# Patient Record
Sex: Female | Born: 1960 | Race: Black or African American | Hispanic: No | Marital: Married | Smoking: Never smoker
Health system: Southern US, Community
[De-identification: ages and names within clinical notes are randomized; demographics above are authoritative.]

## PROBLEM LIST (undated history)

## (undated) DIAGNOSIS — M549 Dorsalgia, unspecified: Secondary | ICD-10-CM

## (undated) DIAGNOSIS — F329 Major depressive disorder, single episode, unspecified: Secondary | ICD-10-CM

## (undated) DIAGNOSIS — I341 Nonrheumatic mitral (valve) prolapse: Secondary | ICD-10-CM

## (undated) DIAGNOSIS — R200 Anesthesia of skin: Secondary | ICD-10-CM

## (undated) DIAGNOSIS — F32A Depression, unspecified: Secondary | ICD-10-CM

## (undated) DIAGNOSIS — G8929 Other chronic pain: Secondary | ICD-10-CM

## (undated) DIAGNOSIS — K5909 Other constipation: Secondary | ICD-10-CM

## (undated) DIAGNOSIS — M1990 Unspecified osteoarthritis, unspecified site: Secondary | ICD-10-CM

## (undated) DIAGNOSIS — I471 Supraventricular tachycardia, unspecified: Secondary | ICD-10-CM

## (undated) DIAGNOSIS — F419 Anxiety disorder, unspecified: Secondary | ICD-10-CM

## (undated) HISTORY — DX: Anxiety disorder, unspecified: F41.9

## (undated) HISTORY — DX: Depression, unspecified: F32.A

## (undated) HISTORY — DX: Anesthesia of skin: R20.0

## (undated) HISTORY — DX: Nonrheumatic mitral (valve) prolapse: I34.1

## (undated) HISTORY — PX: ABDOMINAL HYSTERECTOMY: SHX81

## (undated) HISTORY — DX: Unspecified osteoarthritis, unspecified site: M19.90

## (undated) HISTORY — PX: SPINE SURGERY: SHX786

## (undated) HISTORY — DX: Major depressive disorder, single episode, unspecified: F32.9

## (undated) HISTORY — DX: Supraventricular tachycardia: I47.1

## (undated) HISTORY — DX: Supraventricular tachycardia, unspecified: I47.10

## (undated) HISTORY — DX: Other constipation: K59.09

---

## 1991-02-06 HISTORY — PX: PARTIAL HYSTERECTOMY: SHX80

## 1996-02-06 HISTORY — PX: AV NODE ABLATION: SHX1209

## 1997-09-22 ENCOUNTER — Other Ambulatory Visit: Admission: RE | Admit: 1997-09-22 | Discharge: 1997-09-22 | Payer: Self-pay | Admitting: Obstetrics & Gynecology

## 1997-12-15 ENCOUNTER — Observation Stay (HOSPITAL_COMMUNITY): Admission: AD | Admit: 1997-12-15 | Discharge: 1997-12-16 | Payer: Self-pay | Admitting: Internal Medicine

## 1998-10-28 ENCOUNTER — Encounter: Payer: Self-pay | Admitting: Physical Medicine & Rehabilitation

## 1998-10-28 ENCOUNTER — Ambulatory Visit (HOSPITAL_COMMUNITY)
Admission: RE | Admit: 1998-10-28 | Discharge: 1998-10-28 | Payer: Self-pay | Admitting: Physical Medicine & Rehabilitation

## 1999-02-06 HISTORY — PX: BACK SURGERY: SHX140

## 1999-05-07 ENCOUNTER — Ambulatory Visit (HOSPITAL_COMMUNITY): Admission: RE | Admit: 1999-05-07 | Discharge: 1999-05-07 | Payer: Self-pay | Admitting: Orthopedic Surgery

## 1999-05-07 ENCOUNTER — Encounter: Payer: Self-pay | Admitting: Orthopedic Surgery

## 2000-07-31 HISTORY — PX: OTHER SURGICAL HISTORY: SHX169

## 2000-11-06 ENCOUNTER — Encounter: Payer: Self-pay | Admitting: *Deleted

## 2000-11-06 ENCOUNTER — Encounter: Admission: RE | Admit: 2000-11-06 | Discharge: 2000-11-06 | Payer: Self-pay | Admitting: *Deleted

## 2001-02-14 ENCOUNTER — Encounter (HOSPITAL_COMMUNITY): Admission: RE | Admit: 2001-02-14 | Discharge: 2001-03-16 | Payer: Self-pay | Admitting: *Deleted

## 2001-03-18 ENCOUNTER — Encounter: Admission: RE | Admit: 2001-03-18 | Discharge: 2001-04-17 | Payer: Self-pay | Admitting: *Deleted

## 2002-02-10 ENCOUNTER — Emergency Department (HOSPITAL_COMMUNITY): Admission: EM | Admit: 2002-02-10 | Discharge: 2002-02-10 | Payer: Self-pay | Admitting: *Deleted

## 2002-10-23 ENCOUNTER — Encounter: Payer: Self-pay | Admitting: Obstetrics and Gynecology

## 2002-10-23 ENCOUNTER — Ambulatory Visit (HOSPITAL_COMMUNITY): Admission: RE | Admit: 2002-10-23 | Discharge: 2002-10-23 | Payer: Self-pay | Admitting: Obstetrics and Gynecology

## 2002-11-17 ENCOUNTER — Encounter: Payer: Self-pay | Admitting: Obstetrics and Gynecology

## 2002-11-17 ENCOUNTER — Encounter: Admission: RE | Admit: 2002-11-17 | Discharge: 2002-11-17 | Payer: Self-pay | Admitting: Obstetrics and Gynecology

## 2002-11-23 ENCOUNTER — Encounter: Payer: Self-pay | Admitting: Unknown Physician Specialty

## 2002-11-23 ENCOUNTER — Ambulatory Visit (HOSPITAL_COMMUNITY): Admission: RE | Admit: 2002-11-23 | Discharge: 2002-11-23 | Payer: Self-pay | Admitting: Unknown Physician Specialty

## 2005-07-04 ENCOUNTER — Encounter: Admission: RE | Admit: 2005-07-04 | Discharge: 2005-07-04 | Payer: Self-pay | Admitting: Obstetrics and Gynecology

## 2006-12-13 ENCOUNTER — Encounter: Admission: RE | Admit: 2006-12-13 | Discharge: 2006-12-13 | Payer: Self-pay | Admitting: Obstetrics and Gynecology

## 2007-12-29 ENCOUNTER — Encounter: Admission: RE | Admit: 2007-12-29 | Discharge: 2007-12-29 | Payer: Self-pay | Admitting: Obstetrics and Gynecology

## 2008-02-06 HISTORY — PX: COLONOSCOPY: SHX174

## 2008-07-20 ENCOUNTER — Ambulatory Visit: Payer: Self-pay | Admitting: Gastroenterology

## 2008-08-06 ENCOUNTER — Ambulatory Visit: Payer: Self-pay | Admitting: Gastroenterology

## 2009-01-14 ENCOUNTER — Encounter: Admission: RE | Admit: 2009-01-14 | Discharge: 2009-01-14 | Payer: Self-pay | Admitting: Obstetrics and Gynecology

## 2010-01-18 ENCOUNTER — Encounter
Admission: RE | Admit: 2010-01-18 | Discharge: 2010-01-18 | Payer: Self-pay | Source: Home / Self Care | Attending: Obstetrics and Gynecology | Admitting: Obstetrics and Gynecology

## 2010-04-03 ENCOUNTER — Encounter (INDEPENDENT_AMBULATORY_CARE_PROVIDER_SITE_OTHER): Payer: Self-pay | Admitting: *Deleted

## 2010-04-13 NOTE — Letter (Signed)
Summary: New Patient letter  [facility]  [address] [place], [place] [address]   Phone: [phone]  Fax: [phone]       04/03/2010 MRN: [id number]  [facility] [place] [address] [place], [place]  [address]  Dear Ms. Lieber,  Welcome to the Gastroenterology Division at [facility].    You are scheduled to see Dr.  Russella Dar on 05-08-10 at 10:45A.M. on the 3rd floor at [facility], [address]  We ask that you try to arrive at our office 15 minutes prior to your appointment time to allow for check-in.  We would like you to complete the enclosed self-administered evaluation form prior to your visit and bring it with you on the day of your appointment.  We will review it with you.  Also, please bring a complete list of all your medications or, if you prefer, bring the medication bottles and we will list them.  Please bring your insurance card so that we may make a copy of it.  If your insurance requires a referral to see a specialist, please bring your referral form from your primary care physician.  Co-payments are due at the time of your visit and may be paid by cash, check or credit card.     Your office visit will consist of a consult with your physician (includes a physical exam), any laboratory testing he/she may order, scheduling of any necessary diagnostic testing (e.g. x-ray, ultrasound, CT-scan), and scheduling of a procedure (e.g. Endoscopy, Colonoscopy) if required.  Please allow enough time on your schedule to allow for any/all of these possibilities.    If you cannot keep your appointment, please call [phone] to cancel or reschedule prior to your appointment date.  This allows [place] the opportunity to schedule an appointment for another patient in need of care.  If you do not cancel or reschedule by 5 p.m. the business day prior to your appointment date, you will be charged a $50.00 late cancellation/no-show fee.    Thank you for choosing [facility] for your medical needs.  We appreciate the opportunity to care for you.  Please visit [place] at our website  to learn more about our practice.                     Sincerely,                                                             The Gastroenterology Division

## 2010-05-08 ENCOUNTER — Encounter: Payer: Self-pay | Admitting: Gastroenterology

## 2010-05-08 ENCOUNTER — Ambulatory Visit (INDEPENDENT_AMBULATORY_CARE_PROVIDER_SITE_OTHER): Payer: Medicare Other | Admitting: Gastroenterology

## 2010-05-08 VITALS — BP 108/60 | HR 88 | Ht 72.0 in | Wt 200.8 lb

## 2010-05-08 DIAGNOSIS — K5900 Constipation, unspecified: Secondary | ICD-10-CM | POA: Insufficient documentation

## 2010-05-08 MED ORDER — LACTULOSE 10 GM/15ML PO SOLN: 20.0000 g | Freq: Three times a day (TID) | ORAL | Status: AC

## 2010-05-08 NOTE — Assessment & Plan Note (Addendum)
Chronic constipation likely related to, or at Least exacerbated by, chronic narcotic usage. Colonoscopy 08/2008 and was entirely normal. There is no gastrointestinal reason for her weight gain. Her bloating and flatulence are likely related to constipation. She is very frustrated and was not receptive to several of my recommendations which included a fiber supplement, increased water intake and MiraLax 2-4 times daily titrated for adequate bowel movements. She is agreeable to trying lactulose. If this is not successful consider Amitza. If she could decrease or discontinue narcotic pain medications that would be helpful but is not likely to occur. Occasional scant amount of bright red blood with bowel movements most likely related to a benign anorectal source such as hemorrhoids.

## 2010-05-08 NOTE — Progress Notes (Signed)
History of Present Illness: This is a 50 year old female with chronic constipation. She previously underwent colonoscopy in July 2010 and that was normal. She has been maintained on pain medication since the late 1990s for chronic back pain. She is currently maintained on sustained release morphine. She states she's tried multiple laxatives over the years without consistent success. Has tried MiraLax on a once daily basis without adequate results. She notes that she has cramping from Ducolax and Ex-lax but they are effective. She complains of a 65 pound weight gain over the past 2 years. She occasionally notes a scant amount of rare red blood on the tissue paper and in the commode after straining with bowel movement. She notes bloating abdominal discomfort and gas that improved after a bowel movement. She denies melena, change in stool caliber, nausea, vomiting.  Past Medical History  Diagnosis Date  . Chronic constipation   . Supraventricular tachycardia   . MVP (mitral valve prolapse)   . Vitamin D deficiency   . Depression   . Arthritis   . Colon polyp    Past Surgical History  Procedure Date  . Av node ablation   . Partial hysterectomy 1993  . Back surgery   . Stimulation system implant 07/31/00    reports that she has never smoked. She has never used smokeless tobacco. She reports that she does not drink alcohol or use illicit drugs. family history includes Colon polyps in her father; Coronary artery disease in her father; Hodgkin's lymphoma in her son; Liver cancer in her paternal uncle; and Prostate cancer in her maternal grandfather.  There is no history of Colon cancer. Allergies  Allergen Reactions  . Asa Buff (Mag (Aspirin Buffered)    Outpatient Encounter Prescriptions as of 05/08/2010  Medication Sig Dispense Refill  . buPROPion (WELLBUTRIN SR) 150 MG 12 hr tablet Take 300 mg by mouth daily.        . Cholecalciferol (VITAMIN D3) 2000 UNITS TABS Take 1 tablet by mouth daily.         . DULoxetine (CYMBALTA) 60 MG capsule Take 60 mg by mouth daily.        Marland Kitchen FIBER PO Take 540 mg daily (5 tabs twice daily)       . gabapentin (NEURONTIN) 400 MG tablet Take 400 mg by mouth 3 (three) times daily.        . methylphenidate (RITALIN) 10 MG tablet Take 20 mg by mouth 2 (two) times daily.        Marland Kitchen morphine (MSIR) 15 MG tablet Take 15 mg by mouth 3 (three) times daily.        . Omega 3-6-9 Fatty Acids (OMEGA 3-6-9 COMPLEX) CAPS Take 1 capsule by mouth daily.        . Potassium 99 MG TABS Take 1 tablet by mouth daily.        Marland Kitchen tiZANidine (ZANAFLEX) 4 MG tablet Take 4 mg by mouth daily.        . vitamin B-12 (CYANOCOBALAMIN) 1000 MCG tablet Take 1,000 mcg by mouth daily.        Marland Kitchen zolpidem (AMBIEN) 10 MG tablet Take 10 mg by mouth at bedtime as needed.        . lactulose (CHRONULAC) 10 GM/15ML solution Take 30 mLs (20 g total) by mouth 3 (three) times daily. Take 30cc's 3-4 x daily  240 mL  5    Physical Exam: General: Well developed , well nourished, no acute distress Head: Normocephalic and atraumatic Eyes:  sclerae anicteric, EOMI Ears: Normal auditory acuity Mouth: No deformity or lesions Lungs: Clear throughout to auscultation Heart: Regular rate and rhythm; no murmurs, rubs or bruits Abdomen: Soft, non tender and non distended. No masses, hepatosplenomegaly or hernias noted. Normal Bowel sounds Musculoskeletal: Symmetrical with no gross deformities  Pulses:  Normal pulses noted Extremities: No clubbing, cyanosis, edema or deformities noted Neurological: Alert oriented x 4, grossly nonfocal Psychological:  Alert and cooperative. Normal mood and affect  Assessment and Recommendations:

## 2010-05-08 NOTE — Patient Instructions (Signed)
Your prescription for Lactulose has been sent to your pharmacy to take 3-4 times daily.  Start Magnesium Citrate 2-3 bottles today and tomorrow if needed to start moving your bowels.  Cc: Rudi Heap, MD

## 2010-12-13 ENCOUNTER — Other Ambulatory Visit: Payer: Self-pay | Admitting: Gynecology

## 2010-12-13 DIAGNOSIS — Z1231 Encounter for screening mammogram for malignant neoplasm of breast: Secondary | ICD-10-CM

## 2011-01-23 ENCOUNTER — Ambulatory Visit
Admission: RE | Admit: 2011-01-23 | Discharge: 2011-01-23 | Disposition: A | Discharge status: Home / Self Care | Payer: Medicare Other | Source: Ambulatory Visit | Attending: Gynecology | Admitting: Gynecology

## 2011-01-23 DIAGNOSIS — Z1231 Encounter for screening mammogram for malignant neoplasm of breast: Secondary | ICD-10-CM

## 2011-12-21 ENCOUNTER — Other Ambulatory Visit: Payer: Self-pay | Admitting: Gynecology

## 2011-12-21 DIAGNOSIS — Z1231 Encounter for screening mammogram for malignant neoplasm of breast: Secondary | ICD-10-CM

## 2012-01-11 DIAGNOSIS — M5432 Sciatica, left side: Secondary | ICD-10-CM | POA: Insufficient documentation

## 2012-01-28 ENCOUNTER — Ambulatory Visit
Admission: RE | Admit: 2012-01-28 | Discharge: 2012-01-28 | Disposition: A | Discharge status: Home / Self Care | Payer: Medicare Other | Source: Ambulatory Visit | Attending: Gynecology | Admitting: Gynecology

## 2012-01-28 DIAGNOSIS — Z1231 Encounter for screening mammogram for malignant neoplasm of breast: Secondary | ICD-10-CM

## 2012-06-17 ENCOUNTER — Telehealth: Payer: Self-pay | Admitting: Nurse Practitioner

## 2012-06-17 NOTE — Telephone Encounter (Signed)
APPT MADE

## 2012-06-18 ENCOUNTER — Ambulatory Visit (INDEPENDENT_AMBULATORY_CARE_PROVIDER_SITE_OTHER): Payer: Medicare Other

## 2012-06-18 ENCOUNTER — Encounter: Payer: Self-pay | Admitting: Physician Assistant

## 2012-06-18 ENCOUNTER — Ambulatory Visit (INDEPENDENT_AMBULATORY_CARE_PROVIDER_SITE_OTHER): Payer: Medicare Other | Admitting: Physician Assistant

## 2012-06-18 VITALS — BP 112/70 | HR 89 | Temp 97.5°F | Ht 69.0 in | Wt 195.0 lb

## 2012-06-18 DIAGNOSIS — M79645 Pain in left finger(s): Secondary | ICD-10-CM

## 2012-06-18 DIAGNOSIS — M79609 Pain in unspecified limb: Secondary | ICD-10-CM

## 2012-06-18 MED ORDER — LINACLOTIDE 145 MCG PO CAPS: 145.0000 ug | ORAL_CAPSULE | Freq: Every day | ORAL | Status: DC

## 2012-06-18 NOTE — Progress Notes (Signed)
Subjective:     Patient ID: Molly Castaneda, female   DOB: [birth year], 52 y.o.   MRN: [id number]  HPI Pt with pain and swelling to the L 3rd digit x 3 weeks Denies any definite injury but not sure Denies any pain/numbness to the hand/finger No hx of same Pt already on pain meds for chronic LBP  Review of Systems  All other systems reviewed and are negative.       Objective:   Physical Exam Edema to the PIP joint of the L 3rd digit Decrease in ROM + TTP of joint Boutonn. Deformity noted Xray- neg    Assessment:     1. Finger pain, left        Plan:     Buddy tape Refer to Ortho Pt already has pain meds F/U prn

## 2012-06-18 NOTE — Addendum Note (Signed)
Addended by: Gwenith Daily on: 06/18/2012 10:50 AM   Modules accepted: Orders

## 2012-06-18 NOTE — Patient Instructions (Signed)
Boutonniere handout given

## 2012-09-25 ENCOUNTER — Other Ambulatory Visit: Payer: Self-pay | Admitting: Physician Assistant

## 2012-10-31 ENCOUNTER — Other Ambulatory Visit: Payer: Self-pay

## 2012-10-31 NOTE — Telephone Encounter (Signed)
Last seen 06/18/12  WLW   Furosemide not on EPIC list  Sent from [facility]

## 2012-11-03 ENCOUNTER — Other Ambulatory Visit: Payer: Self-pay

## 2012-11-03 NOTE — Telephone Encounter (Signed)
Last seen 06/18/12  WLW  Furosemide not on EPIC list of meds

## 2012-11-05 MED ORDER — FUROSEMIDE 40 MG PO TABS: 40.0000 mg | ORAL_TABLET | Freq: Every day | ORAL | Status: DC

## 2012-11-07 MED ORDER — FUROSEMIDE 40 MG PO TABS: 40.0000 mg | ORAL_TABLET | Freq: Every day | ORAL | Status: DC

## 2012-11-07 NOTE — Telephone Encounter (Signed)
Patient needs to be seen. Has exceeded time since last visit. Needs to bring all medications to next appointment. Last refill. 

## 2012-11-08 NOTE — Telephone Encounter (Signed)
kmart notified of refill on furosemide left on voice mail

## 2012-12-31 ENCOUNTER — Other Ambulatory Visit: Payer: Self-pay

## 2012-12-31 DIAGNOSIS — Z1231 Encounter for screening mammogram for malignant neoplasm of breast: Secondary | ICD-10-CM

## 2013-01-10 ENCOUNTER — Other Ambulatory Visit: Payer: Self-pay | Admitting: Family Medicine

## 2013-01-12 NOTE — Telephone Encounter (Signed)
Last seen 06/18/12  WLW 

## 2013-01-21 ENCOUNTER — Ambulatory Visit (INDEPENDENT_AMBULATORY_CARE_PROVIDER_SITE_OTHER): Payer: Medicare Other | Admitting: General Practice

## 2013-01-21 ENCOUNTER — Encounter: Payer: Self-pay | Admitting: General Practice

## 2013-01-21 VITALS — BP 117/74 | HR 100 | Temp 97.9°F | Ht 69.0 in | Wt 194.5 lb

## 2013-01-21 DIAGNOSIS — Z13820 Encounter for screening for osteoporosis: Secondary | ICD-10-CM

## 2013-01-21 DIAGNOSIS — Z0000 Encounter for general adult medical examination without abnormal findings: Secondary | ICD-10-CM

## 2013-01-21 DIAGNOSIS — R609 Edema, unspecified: Secondary | ICD-10-CM

## 2013-01-21 DIAGNOSIS — K5909 Other constipation: Secondary | ICD-10-CM

## 2013-01-21 DIAGNOSIS — M20002 Unspecified deformity of left finger(s): Secondary | ICD-10-CM

## 2013-01-21 LAB — POCT CBC
Lymph, poc: 1.5 (ref 0.6–3.4)
Platelet Count, POC: 272 10*3/uL (ref 142–424)
RBC: 5.2 M/uL (ref 4.04–5.48)
RDW, POC: 13.6 %

## 2013-01-21 MED ORDER — LINACLOTIDE 290 MCG PO CAPS: 290.0000 ug | ORAL_CAPSULE | Freq: Every day | ORAL | Status: DC

## 2013-01-21 MED ORDER — FUROSEMIDE 40 MG PO TABS: 40.0000 mg | ORAL_TABLET | Freq: Every day | ORAL | Status: DC

## 2013-01-21 NOTE — Progress Notes (Signed)
   Subjective:    Patient ID: Molly Castaneda, female    DOB: 1960/08/24, 52 y.o.   MRN: [id number]  HPI Patient presents today for chronic health follow up. She has a history of fluid retention, edema and constipation. She has a history of chronic pain and is being managed by a pain management clinic in [place]. She is currently taking linzess daily and finds that it is not as effective. Verbalizes at times it is 3 weeks before having bowel movement. Reports trying to eat a healthy diet (higher in fiber) and adequate water intake.  Reports having a frozen left hand middle finger and would like referral to ortho.    Review of Systems  Constitutional: Negative for fever and chills.  Respiratory: Negative for chest tightness, shortness of breath and wheezing.   Cardiovascular: Negative for chest pain, palpitations and leg swelling.  Gastrointestinal: Negative for nausea, vomiting, abdominal pain, diarrhea, constipation and blood in stool.  Genitourinary: Negative for dysuria and difficulty urinating.  Neurological: Negative for dizziness, weakness and headaches.       Objective:   Physical Exam  Constitutional: She is oriented to person, place, and time. She appears well-developed and well-nourished.  HENT:  Head: Normocephalic and atraumatic.  Right Ear: External ear normal.  Left Ear: External ear normal.  Eyes: Pupils are equal, round, and reactive to light.  Neck: Normal range of motion. Neck supple. No thyromegaly present.  Cardiovascular: Normal rate, regular rhythm and normal heart sounds.   Pulmonary/Chest: Effort normal and breath sounds normal. No respiratory distress. She exhibits no tenderness.  Abdominal: Soft. Bowel sounds are normal. She exhibits no distension. There is no tenderness.  Lymphadenopathy:    She has no cervical adenopathy.  Neurological: She is alert and oriented to person, place, and time.  Skin: Skin is warm and dry.  Psychiatric: She has a normal  mood and affect.          Assessment & Plan:  1. Annual physical exam  - POCT CBC - CMP14+EGFR - NMR, lipoprofile  2. Chronic constipation  - Linaclotide (LINZESS) 290 MCG CAPS capsule; Take 1 capsule (290 mcg total) by mouth daily.  Dispense: 30 capsule; Refill: 6  3. Edema  - furosemide (LASIX) 40 MG tablet; Take 1 tablet (40 mg total) by mouth daily.  Dispense: 30 tablet; Refill: 6  4. Finger deformity, left  - Ambulatory referral to Orthopedic Surgery  5. Osteoporosis screening  - DG Bone Density; Future -Continue all current medications Labs pending F/u in 6 months Discussed benefits of regular exercise and healthy eating Patient verbalized understanding Coralie Keens, FNP-C

## 2013-01-21 NOTE — Patient Instructions (Signed)

## 2013-01-23 LAB — NMR, LIPOPROFILE
Cholesterol: 181 mg/dL (ref <200)
LDLC SERPL CALC-MCNC: 101 mg/dL — ABNORMAL HIGH (ref <100)
LP-IR Score: <25 (ref <=45)
Small LDL Particle Number: 445 nmol/L (ref <=527)

## 2013-01-23 LAB — CMP14+EGFR
ALT: 14 IU/L (ref 0–32)
BUN/Creatinine Ratio: 19 (ref 9–23)
GFR calc Af Amer: 89 mL/min/{1.73_m2} (ref >59)
GFR calc non Af Amer: 77 mL/min/{1.73_m2} (ref >59)
Glucose: 78 mg/dL (ref 65–99)
Potassium: 4.3 mmol/L (ref 3.5–5.2)
Sodium: 139 mmol/L (ref 134–144)

## 2013-02-11 ENCOUNTER — Other Ambulatory Visit: Payer: Self-pay | Admitting: General Practice

## 2013-02-11 ENCOUNTER — Ambulatory Visit: Payer: Medicare Other

## 2013-02-11 ENCOUNTER — Other Ambulatory Visit: Payer: Medicare HMO

## 2013-02-11 ENCOUNTER — Ambulatory Visit (INDEPENDENT_AMBULATORY_CARE_PROVIDER_SITE_OTHER): Payer: Medicare HMO

## 2013-02-11 ENCOUNTER — Ambulatory Visit (INDEPENDENT_AMBULATORY_CARE_PROVIDER_SITE_OTHER): Payer: Medicare HMO | Admitting: Pharmacist

## 2013-02-11 VITALS — Ht 68.5 in | Wt 190.0 lb

## 2013-02-11 DIAGNOSIS — M949 Disorder of cartilage, unspecified: Secondary | ICD-10-CM

## 2013-02-11 DIAGNOSIS — M8580 Other specified disorders of bone density and structure, unspecified site: Secondary | ICD-10-CM

## 2013-02-11 DIAGNOSIS — K5909 Other constipation: Secondary | ICD-10-CM

## 2013-02-11 DIAGNOSIS — Z13820 Encounter for screening for osteoporosis: Secondary | ICD-10-CM

## 2013-02-11 DIAGNOSIS — M2550 Pain in unspecified joint: Secondary | ICD-10-CM

## 2013-02-11 DIAGNOSIS — M899 Disorder of bone, unspecified: Secondary | ICD-10-CM

## 2013-02-11 DIAGNOSIS — R609 Edema, unspecified: Secondary | ICD-10-CM

## 2013-02-11 MED ORDER — LINACLOTIDE 290 MCG PO CAPS: 290.0000 ug | ORAL_CAPSULE | Freq: Every day | ORAL | Status: DC

## 2013-02-11 MED ORDER — FUROSEMIDE 40 MG PO TABS: ORAL_TABLET | ORAL | Status: DC

## 2013-02-11 NOTE — Progress Notes (Signed)
Patient ID: Molly HarperShelia A Castaneda, female   DOB: 12-Jan-1961, 53 y.o.   MRN: [id number] [facility] Current Height: Height: 5' 8.5" (174 cm)      Max Lifetime Height:  5\' 9"  Current Weight: Weight: 190 lb (86.183 kg)       Ethnicity:African American    HPI: Does pt already have a diagnosis of:  Osteopenia?  No Osteoporosis?  No  Back Pain?  Yes  - has had back surgery in 2001    Kyphosis?  No Prior fracture?  No Med(s) for Osteoporosis/Osteopenia:  none Med(s) previously tried for Osteoporosis/Osteopenia:  none                                                             PMH: Age at menopause:  Surgical - 30's Hysterectomy?  Yes Oophorectomy?  No HRT? No Steroid Use?  No Thyroid med?  No History of cancer?  No History of digestive disorders (ie Crohn's)?  No Current or previous eating disorders?  No Last Vitamin D Result:  needed Last GFR Result:  89 (01/21/2013)   FH/SH: Family history of osteoporosis?  No Parent with history of hip fracture?  No Family history of breast cancer?  No Exercise?  Yes - walks on treadmill Smoking?  No Alcohol?  No    Calcium Assessment Calcium Intake  # of servings/day  Calcium mg  Milk (8 oz) 0.5  x  300  = 150mg   Yogurt (4 oz) 0 x  200 = 0  Cheese (1 oz) 0 x  200 = 0  Other Calcium sources   250mg   Ca supplement none = 0   Estimated calcium intake per day 400mg     DEXA Results Date of Test T-Score for AP Spine L1-L4 T-Score for Total Left Hip T-Score for Total Right Hip  02/11/2013 -0.2 -0.8 -0.4  11/07/2011 -0.1 -0.7 -0.4  07/28/2008 -0.0 -0.7 -0.4       ** T-Score of neck of left hip was -1.1 today / 02/11/2013**  FRAX 10 year estimate: Total FX risk:  5.5%  (consider medication if >/= 20%) Hip FX risk:  0.3%  (consider medication if >/= 3%)  Assessment: Osteopenia with low fracture risk  Recommendations: 1.  Discussed BMD results and fracture risk 2.  recommend calcium 1200mg  daily through supplementation or diet.  3.   recommend weight bearing exercise - 30 minutes at least 4 days per week.  Patient given information about balance exercise classes [facility].  She will also check [facility] since she has done there before for Silver Sneakers and she plans to restart. 4.  Counseled and educated about fall risk and prevention.  Recheck DEXA:  2 years  Time spent counseling patient:  20 minutes   Henrene Pastorammy Zarius Furr, PharmD, CPP

## 2013-02-11 NOTE — Patient Instructions (Signed)
[place] [facility] - has a balance class [phone]   Fall Prevention and Home Safety Falls cause injuries and can affect all age groups. It is possible to use preventive measures to significantly decrease the likelihood of falls. There are many simple measures which can make your home safer and prevent falls. OUTDOORS  Repair cracks and edges of walkways and driveways.  Remove high doorway thresholds.  Trim shrubbery on the main path into your home.  Have good outside lighting.  Clear walkways of tools, rocks, debris, and clutter.  Check that handrails are not broken and are securely fastened. Both sides of steps should have handrails.  Have leaves, snow, and ice cleared regularly.  Use sand or salt on walkways during winter months.  In the garage, clean up grease or oil spills. BATHROOM  Install night lights.  Install grab bars by the toilet and in the tub and shower.  Use non-skid mats or decals in the tub or shower.  Place a plastic non-slip stool in the shower to sit on, if needed.  Keep floors dry and clean up all water on the floor immediately.  Remove soap buildup in the tub or shower on a regular basis.  Secure bath mats with non-slip, double-sided rug tape.  Remove throw rugs and tripping hazards from the floors. BEDROOMS  Install night lights.  Make sure a bedside light is easy to reach.  Do not use oversized bedding.  Keep a telephone by your bedside.  Have a firm chair with side arms to use for getting dressed.  Remove throw rugs and tripping hazards from the floor. KITCHEN  Keep handles on pots and pans turned toward the center of the stove. Use back burners when possible.  Clean up spills quickly and allow time for drying.  Avoid walking on wet floors.  Avoid hot utensils and knives.  Position shelves so they are not too high or low.  Place commonly used objects within easy reach.  If necessary, use a sturdy step stool  with a grab bar when reaching.  Keep electrical cables out of the way.  Do not use floor polish or wax that makes floors slippery. If you must use wax, use non-skid floor wax.  Remove throw rugs and tripping hazards from the floor. STAIRWAYS  Never leave objects on stairs.  Place handrails on both sides of stairways and use them. Fix any loose handrails. Make sure handrails on both sides of the stairways are as long as the stairs.  Check carpeting to make sure it is firmly attached along stairs. Make repairs to worn or loose carpet promptly.  Avoid placing throw rugs at the top or bottom of stairways, or properly secure the rug with carpet tape to prevent slippage. Get rid of throw rugs, if possible.  Have an electrician put in a light switch at the top and bottom of the stairs. OTHER FALL PREVENTION TIPS  Wear low-heel or rubber-soled shoes that are supportive and fit well. Wear closed toe shoes.  When using a stepladder, make sure it is fully opened and both spreaders are firmly locked. Do not climb a closed stepladder.  Add color or contrast paint or tape to grab bars and handrails in your home. Place contrasting color strips on first and last steps.  Learn and use mobility aids as needed. Install an electrical emergency response system.  Turn on lights to avoid dark areas. Replace light bulbs that burn out immediately. Get light switches that glow.  Arrange  furniture to create clear pathways. Keep furniture in the same place.  Firmly attach carpet with non-skid or double-sided tape.  Eliminate uneven floor surfaces.  Select a carpet pattern that does not visually hide the edge of steps.  Be aware of all pets. OTHER HOME SAFETY TIPS  Set the water temperature for 120 F (48.8 C).  Keep emergency numbers on or near the telephone.  Keep smoke detectors on every level of the home and near sleeping areas. Document Released: 01/12/2002 Document Revised: 07/24/2011  Document Reviewed: 04/13/2011 [facility] Patient Information 2014 [place].

## 2013-02-12 ENCOUNTER — Telehealth: Payer: Self-pay | Admitting: General Practice

## 2013-02-12 LAB — RHEUMATOID FACTOR: RHEUMATOID FACTOR: 9.5 [IU]/mL (ref 0.0–13.9)

## 2013-02-12 LAB — SEDIMENTATION RATE: Sed Rate: 24 mm/hr (ref 0–40)

## 2013-02-12 LAB — C-REACTIVE PROTEIN: CRP: 0.4 mg/L (ref 0.0–4.9)

## 2013-02-12 LAB — ANA: Anti Nuclear Antibody(ANA): NEGATIVE

## 2013-02-17 NOTE — Telephone Encounter (Signed)
Message copied by Baltazar ApoJOYCE, Adaliz Dobis W on Tue Feb 17, 2013 12:41 PM ------      Message from: Philomena DohenyHALIBURTON, MAE E      Created: Thu Feb 05, 2013  3:28 PM       Most labs wnl, LDL slightly elevated. Work on healthy eating (low fat, baked foods) and some form of regular exercise. Will recheck in 3 months ------

## 2013-02-27 ENCOUNTER — Ambulatory Visit: Payer: Medicare Other

## 2013-03-03 ENCOUNTER — Encounter (HOSPITAL_BASED_OUTPATIENT_CLINIC_OR_DEPARTMENT_OTHER): Payer: Self-pay | Admitting: *Deleted

## 2013-03-03 ENCOUNTER — Other Ambulatory Visit: Payer: Self-pay | Admitting: Orthopedic Surgery

## 2013-03-03 NOTE — Progress Notes (Signed)
Pt used to see [facility] for svt-had ablation 1998-has not had to go back since To come in for bmet-ekg

## 2013-03-04 ENCOUNTER — Encounter (HOSPITAL_BASED_OUTPATIENT_CLINIC_OR_DEPARTMENT_OTHER)
Admission: RE | Admit: 2013-03-04 | Discharge: 2013-03-04 | Disposition: A | Discharge status: Home / Self Care | Payer: Medicare HMO | Source: Ambulatory Visit | Attending: Orthopedic Surgery | Admitting: Orthopedic Surgery

## 2013-03-04 ENCOUNTER — Other Ambulatory Visit: Payer: Self-pay

## 2013-03-04 DIAGNOSIS — Z01818 Encounter for other preprocedural examination: Secondary | ICD-10-CM | POA: Insufficient documentation

## 2013-03-04 DIAGNOSIS — Z01812 Encounter for preprocedural laboratory examination: Secondary | ICD-10-CM | POA: Insufficient documentation

## 2013-03-04 LAB — BASIC METABOLIC PANEL
BUN: 15 mg/dL (ref 6–23)
CALCIUM: 9 mg/dL (ref 8.4–10.5)
CO2: 30 mEq/L (ref 19–32)
CREATININE: 0.95 mg/dL (ref 0.50–1.10)
Chloride: 102 mEq/L (ref 96–112)
GFR calc Af Amer: 77 mL/min — ABNORMAL LOW (ref >90)
GFR calc non Af Amer: 67 mL/min — ABNORMAL LOW (ref >90)
GLUCOSE: 96 mg/dL (ref 70–99)
Potassium: 4.6 mEq/L (ref 3.7–5.3)
Sodium: 141 mEq/L (ref 137–147)

## 2013-03-05 NOTE — H&P (Signed)
Molly Castaneda is an 53 y.o. female.   Chief Complaint: c/o deformity of the  left long finger HPI: Molly Castaneda is a 53 year-old disabled woman who has had significant spinal degenerative disc disease and surgery.  She is on chronic pain management. During the summer of 2014 she developed boutonniere posture of her left long finger.  She does not recall an antecedent injury.  Her mother has history of rheumatoid arthritis.  She reports no other joints causing her difficulty other than chronic painful back. She saw Dr. Caralyn Guile of [facility] and was advised to use a splint for her finger.  She has not been successful.  She requested an alternative hand surgery referral.     Past Medical History  Diagnosis Date  . Chronic constipation   . MVP (mitral valve prolapse)     had ablation 1998  . Vitamin D deficiency   . Depression   . Arthritis   . Numbness in left leg     s/p spinal surgery  . Hypertension   . Supraventricular tachycardia     ablation 1998  . Chronic back pain     Past Surgical History  Procedure Laterality Date  . Av node ablation  1998    for svt  . Partial hysterectomy  1993  . Stimulation system implant  07/31/00    lumbar nerve stimulator-none funtioning now  . Back surgery  2001    lumbar fusion    Family History  Problem Relation Age of Onset  . Coronary artery disease Father   . Prostate cancer Maternal Grandfather   . Hodgkin's lymphoma Son   . Liver cancer Paternal Uncle     ?  Marland Kitchen Colon polyps Father   . Colon cancer Neg Hx    Social History:  reports that she has never smoked. She has never used smokeless tobacco. She reports that she does not drink alcohol or use illicit drugs.  Allergies: No Known Allergies  No prescriptions prior to admission    Results for orders placed during the hospital encounter of 03/06/13 (from the past 48 hour(s))  BASIC METABOLIC PANEL     Status: Abnormal   Collection Time    03/04/13  9:14 AM      Result  Value Range   Sodium 141  137 - 147 mEq/L   Potassium 4.6  3.7 - 5.3 mEq/L   Chloride 102  96 - 112 mEq/L   CO2 30  19 - 32 mEq/L   Glucose, Bld 96  70 - 99 mg/dL   BUN 15  6 - 23 mg/dL   Creatinine, Ser 0.95  0.50 - 1.10 mg/dL   Calcium 9.0  8.4 - 10.5 mg/dL   GFR calc non Af Amer 67 (*) >90 mL/min   GFR calc Af Amer 77 (*) >90 mL/min   Comment: (NOTE)     The eGFR has been calculated using the CKD EPI equation.     This calculation has not been validated in all clinical situations.     eGFR's persistently <90 mL/min signify possible Chronic Kidney     Disease.    No results found.   Pertinent items are noted in HPI.  Height 5' 8.5" (1.74 m), weight 86.183 kg (190 lb).  General appearance: alert Head: Normocephalic, without obvious abnormality Neck: supple, symmetrical, trachea midline Resp: clear to auscultation bilaterally Cardio: regular rate and rhythm GI: normal findings: bowel sounds normal Extremities:.  Inspection of her hands reveals a  stage III boutonniere left long finger.  She has a 35 degree extensor lag at the PIP joint and hyperextension of the DIP joint of 30 degrees.  She is very swollen.  Her other fingers have full motion.  Her neurovascular examination is intact.  X-ray of her finger demonstrates boutonniere posture.  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  Plan: To the OR for repair left long finger boutonniere deformity.The procedure, risks,benefits and post-op course were discussed with the patient at length and they were in agreement with the plan.  DASNOIT,Trejan Buda J 03/05/2013, 12:32 PM  H&P documentation: 03/06/2013  -History and Physical Reviewed  -Patient has been re-examined  -No change in the plan of care  Cammie Sickle, MD

## 2013-03-06 ENCOUNTER — Encounter (HOSPITAL_BASED_OUTPATIENT_CLINIC_OR_DEPARTMENT_OTHER): Payer: Self-pay | Admitting: *Deleted

## 2013-03-06 ENCOUNTER — Ambulatory Visit (HOSPITAL_BASED_OUTPATIENT_CLINIC_OR_DEPARTMENT_OTHER): Payer: Medicare HMO | Admitting: Anesthesiology

## 2013-03-06 ENCOUNTER — Encounter (HOSPITAL_BASED_OUTPATIENT_CLINIC_OR_DEPARTMENT_OTHER): Payer: Medicare HMO | Admitting: Anesthesiology

## 2013-03-06 ENCOUNTER — Encounter (HOSPITAL_BASED_OUTPATIENT_CLINIC_OR_DEPARTMENT_OTHER)
Admission: RE | Disposition: A | Discharge status: Home / Self Care | Payer: Self-pay | Source: Ambulatory Visit | Attending: Orthopedic Surgery

## 2013-03-06 ENCOUNTER — Ambulatory Visit (HOSPITAL_BASED_OUTPATIENT_CLINIC_OR_DEPARTMENT_OTHER)
Admission: RE | Admit: 2013-03-06 | Discharge: 2013-03-06 | Disposition: A | Discharge status: Home / Self Care | Payer: Medicare HMO | Source: Ambulatory Visit | Attending: Orthopedic Surgery | Admitting: Orthopedic Surgery

## 2013-03-06 DIAGNOSIS — M20029 Boutonniere deformity of unspecified finger(s): Secondary | ICD-10-CM | POA: Insufficient documentation

## 2013-03-06 DIAGNOSIS — I059 Rheumatic mitral valve disease, unspecified: Secondary | ICD-10-CM | POA: Insufficient documentation

## 2013-03-06 DIAGNOSIS — F329 Major depressive disorder, single episode, unspecified: Secondary | ICD-10-CM | POA: Insufficient documentation

## 2013-03-06 DIAGNOSIS — M19049 Primary osteoarthritis, unspecified hand: Secondary | ICD-10-CM | POA: Insufficient documentation

## 2013-03-06 DIAGNOSIS — G8929 Other chronic pain: Secondary | ICD-10-CM | POA: Insufficient documentation

## 2013-03-06 DIAGNOSIS — F3289 Other specified depressive episodes: Secondary | ICD-10-CM | POA: Insufficient documentation

## 2013-03-06 DIAGNOSIS — K5900 Constipation, unspecified: Secondary | ICD-10-CM | POA: Insufficient documentation

## 2013-03-06 DIAGNOSIS — Z981 Arthrodesis status: Secondary | ICD-10-CM | POA: Insufficient documentation

## 2013-03-06 DIAGNOSIS — R209 Unspecified disturbances of skin sensation: Secondary | ICD-10-CM | POA: Insufficient documentation

## 2013-03-06 DIAGNOSIS — I498 Other specified cardiac arrhythmias: Secondary | ICD-10-CM | POA: Insufficient documentation

## 2013-03-06 DIAGNOSIS — I10 Essential (primary) hypertension: Secondary | ICD-10-CM | POA: Insufficient documentation

## 2013-03-06 DIAGNOSIS — IMO0002 Reserved for concepts with insufficient information to code with codable children: Secondary | ICD-10-CM | POA: Insufficient documentation

## 2013-03-06 HISTORY — PX: REPAIR EXTENSOR TENDON: SHX5382

## 2013-03-06 HISTORY — DX: Dorsalgia, unspecified: M54.9

## 2013-03-06 HISTORY — DX: Other chronic pain: G89.29

## 2013-03-06 LAB — POCT HEMOGLOBIN-HEMACUE: HEMOGLOBIN: 13 g/dL (ref 12.0–15.0)

## 2013-03-06 SURGERY — REPAIR, TENDON, EXTENSOR
Anesthesia: General | Site: Finger | Laterality: Left

## 2013-03-06 MED ORDER — CHLORHEXIDINE GLUCONATE 4 % EX LIQD: 60.0000 mL | Freq: Once | CUTANEOUS | Status: DC

## 2013-03-06 MED ORDER — LIDOCAINE HCL 2 % IJ SOLN
INTRAMUSCULAR | Status: AC
  Filled 2013-03-06: qty 40

## 2013-03-06 MED ORDER — ONDANSETRON HCL 4 MG/2ML IJ SOLN: 4.0000 mg | Freq: Once | INTRAMUSCULAR | Status: DC | PRN

## 2013-03-06 MED ORDER — LACTATED RINGERS IV SOLN
INTRAVENOUS | Status: DC
  Administered 2013-03-06: 07:00:00 via INTRAVENOUS

## 2013-03-06 MED ORDER — MIDAZOLAM HCL 5 MG/5ML IJ SOLN
INTRAMUSCULAR | Status: DC | PRN
  Administered 2013-03-06: 2 mg via INTRAVENOUS

## 2013-03-06 MED ORDER — LIDOCAINE HCL 2 % IJ SOLN
INTRAMUSCULAR | Status: DC | PRN
  Administered 2013-03-06: 4 mL

## 2013-03-06 MED ORDER — ONDANSETRON HCL 4 MG/2ML IJ SOLN
INTRAMUSCULAR | Status: DC | PRN
  Administered 2013-03-06: 4 mg via INTRAVENOUS

## 2013-03-06 MED ORDER — HYDROMORPHONE HCL PF 1 MG/ML IJ SOLN
0.2500 mg | INTRAMUSCULAR | Status: DC | PRN
  Administered 2013-03-06 (×2): 0.25 mg via INTRAVENOUS

## 2013-03-06 MED ORDER — MIDAZOLAM HCL 2 MG/2ML IJ SOLN: 1.0000 mg | INTRAMUSCULAR | Status: DC | PRN

## 2013-03-06 MED ORDER — FENTANYL CITRATE 0.05 MG/ML IJ SOLN: 50.0000 ug | INTRAMUSCULAR | Status: DC | PRN

## 2013-03-06 MED ORDER — OXYCODONE HCL 5 MG/5ML PO SOLN: 5.0000 mg | Freq: Once | ORAL | Status: AC | PRN

## 2013-03-06 MED ORDER — DEXAMETHASONE SODIUM PHOSPHATE 10 MG/ML IJ SOLN
INTRAMUSCULAR | Status: DC | PRN
  Administered 2013-03-06: 10 mg via INTRAVENOUS

## 2013-03-06 MED ORDER — PROPOFOL 10 MG/ML IV BOLUS
INTRAVENOUS | Status: AC
  Filled 2013-03-06: qty 20

## 2013-03-06 MED ORDER — OXYCODONE-ACETAMINOPHEN 5-325 MG PO TABS: ORAL_TABLET | ORAL | Status: DC

## 2013-03-06 MED ORDER — LIDOCAINE HCL (CARDIAC) 20 MG/ML IV SOLN
INTRAVENOUS | Status: DC | PRN
  Administered 2013-03-06: 80 mg via INTRAVENOUS

## 2013-03-06 MED ORDER — PROPOFOL 10 MG/ML IV BOLUS
INTRAVENOUS | Status: DC | PRN
  Administered 2013-03-06: 200 mg via INTRAVENOUS

## 2013-03-06 MED ORDER — FENTANYL CITRATE 0.05 MG/ML IJ SOLN
INTRAMUSCULAR | Status: DC | PRN
  Administered 2013-03-06 (×2): 25 ug via INTRAVENOUS
  Administered 2013-03-06: 100 ug via INTRAVENOUS
  Administered 2013-03-06 (×2): 25 ug via INTRAVENOUS

## 2013-03-06 MED ORDER — MIDAZOLAM HCL 2 MG/2ML IJ SOLN
INTRAMUSCULAR | Status: AC
  Filled 2013-03-06: qty 2

## 2013-03-06 MED ORDER — OXYCODONE HCL 5 MG PO TABS
5.0000 mg | ORAL_TABLET | Freq: Once | ORAL | Status: AC | PRN
  Administered 2013-03-06: 5 mg via ORAL
  Filled 2013-03-06: qty 1

## 2013-03-06 MED ORDER — CEFAZOLIN SODIUM-DEXTROSE 2-3 GM-% IV SOLR
INTRAVENOUS | Status: AC
  Filled 2013-03-06: qty 50

## 2013-03-06 MED ORDER — HYDROMORPHONE HCL PF 1 MG/ML IJ SOLN
INTRAMUSCULAR | Status: AC
  Filled 2013-03-06: qty 1

## 2013-03-06 MED ORDER — CEPHALEXIN 500 MG PO CAPS: 500.0000 mg | ORAL_CAPSULE | Freq: Three times a day (TID) | ORAL | Status: DC

## 2013-03-06 MED ORDER — FENTANYL CITRATE 0.05 MG/ML IJ SOLN
INTRAMUSCULAR | Status: AC
  Filled 2013-03-06: qty 4

## 2013-03-06 SURGICAL SUPPLY — 90 items
ANCH SUT 3-0 MN 1 LD NDL (Anchor) ×2 IMPLANT
ANCHOR JUGGERKNOT 1.0 3-0 NLD (Anchor) ×2 IMPLANT
BANDAGE ADH SHEER 1  50/CT (GAUZE/BANDAGES/DRESSINGS) IMPLANT
BANDAGE COBAN STERILE 2 (GAUZE/BANDAGES/DRESSINGS) IMPLANT
BANDAGE ELASTIC 3 VELCRO ST LF (GAUZE/BANDAGES/DRESSINGS) IMPLANT
BLADE MINI RND TIP GREEN BEAV (BLADE) IMPLANT
BLADE SURG 15 STRL LF DISP TIS (BLADE) ×1 IMPLANT
BLADE SURG 15 STRL SS (BLADE) ×2
BNDG CMPR 9X4 STRL LF SNTH (GAUZE/BANDAGES/DRESSINGS) ×1
BNDG CMPR MD 5X2 ELC HKLP STRL (GAUZE/BANDAGES/DRESSINGS)
BNDG COHESIVE 1X5 TAN STRL LF (GAUZE/BANDAGES/DRESSINGS) IMPLANT
BNDG COHESIVE 3X5 TAN STRL LF (GAUZE/BANDAGES/DRESSINGS) ×1 IMPLANT
BNDG ELASTIC 2 VLCR STRL LF (GAUZE/BANDAGES/DRESSINGS) IMPLANT
BNDG ESMARK 4X9 LF (GAUZE/BANDAGES/DRESSINGS) ×1 IMPLANT
BNDG GAUZE ELAST 4 BULKY (GAUZE/BANDAGES/DRESSINGS) ×4 IMPLANT
BRUSH SCRUB EZ PLAIN DRY (MISCELLANEOUS) ×2 IMPLANT
CATH ROBINSON RED A/P 10FR (CATHETERS) IMPLANT
CATH ROBINSON RED A/P 12FR (CATHETERS) IMPLANT
CORDS BIPOLAR (ELECTRODE) ×2 IMPLANT
COVER MAYO STAND STRL (DRAPES) ×2 IMPLANT
COVER TABLE BACK 60X90 (DRAPES) ×2 IMPLANT
CUFF TOURNIQUET SINGLE 18IN (TOURNIQUET CUFF) ×1 IMPLANT
DECANTER SPIKE VIAL GLASS SM (MISCELLANEOUS) ×1 IMPLANT
DRAIN PENROSE 1/4X12 LTX STRL (WOUND CARE) IMPLANT
DRAPE EXTREMITY T 121X128X90 (DRAPE) ×2 IMPLANT
DRAPE SURG 17X23 STRL (DRAPES) ×2 IMPLANT
GAUZE SPONGE 4X4 16PLY XRAY LF (GAUZE/BANDAGES/DRESSINGS) IMPLANT
GAUZE XEROFORM 1X8 LF (GAUZE/BANDAGES/DRESSINGS) ×2 IMPLANT
GLOVE BIO SURGEON STRL SZ 6.5 (GLOVE) ×2 IMPLANT
GLOVE BIOGEL M STRL SZ7.5 (GLOVE) ×2 IMPLANT
GLOVE BIOGEL PI IND STRL 7.0 (GLOVE) IMPLANT
GLOVE BIOGEL PI INDICATOR 7.0 (GLOVE) ×1
GLOVE ORTHO TXT STRL SZ7.5 (GLOVE) ×2 IMPLANT
GOWN STRL REUS W/ TWL LRG LVL3 (GOWN DISPOSABLE) ×1 IMPLANT
GOWN STRL REUS W/ TWL XL LVL3 (GOWN DISPOSABLE) ×2 IMPLANT
GOWN STRL REUS W/TWL LRG LVL3 (GOWN DISPOSABLE) ×2
GOWN STRL REUS W/TWL XL LVL3 (GOWN DISPOSABLE) ×4
NDL ADDISON D1/2 CIR (NEEDLE) IMPLANT
NDL HYPO 25X1 1.5 SAFETY (NEEDLE) IMPLANT
NDL KEITH (NEEDLE) IMPLANT
NDL KEITH SZ10 STRAIGHT (NEEDLE) IMPLANT
NEEDLE 27GAX1X1/2 (NEEDLE) ×1 IMPLANT
NEEDLE ADDISON D1/2 CIR (NEEDLE) IMPLANT
NEEDLE HYPO 25X1 1.5 SAFETY (NEEDLE) IMPLANT
NEEDLE KEITH (NEEDLE) IMPLANT
NEEDLE KEITH SZ10 STRAIGHT (NEEDLE) IMPLANT
NS IRRIG 1000ML POUR BTL (IV SOLUTION) ×2 IMPLANT
PACK BASIN DAY SURGERY FS (CUSTOM PROCEDURE TRAY) ×2 IMPLANT
PAD CAST 3X4 CTTN HI CHSV (CAST SUPPLIES) IMPLANT
PADDING CAST ABS 3INX4YD NS (CAST SUPPLIES) ×1
PADDING CAST ABS 4INX4YD NS (CAST SUPPLIES) ×1
PADDING CAST ABS COTTON 3X4 (CAST SUPPLIES) IMPLANT
PADDING CAST ABS COTTON 4X4 ST (CAST SUPPLIES) ×1 IMPLANT
PADDING CAST COTTON 3X4 STRL (CAST SUPPLIES) ×2
PASSER SUT SWANSON 36MM LOOP (INSTRUMENTS) IMPLANT
SLEEVE SCD COMPRESS KNEE MED (MISCELLANEOUS) IMPLANT
SPLINT FINGER 5.25 ALUM (CAST SUPPLIES) ×2
SPLINT FINGER 5/8X5.25 (CAST SUPPLIES) IMPLANT
SPLINT PLASTER CAST XFAST 3X15 (CAST SUPPLIES) IMPLANT
SPLINT PLASTER XTRA FASTSET 3X (CAST SUPPLIES)
SPONGE GAUZE 4X4 12PLY (GAUZE/BANDAGES/DRESSINGS) ×2 IMPLANT
STOCKINETTE 4X48 STRL (DRAPES) ×2 IMPLANT
STOCKINETTE 6  STRL (DRAPES)
STOCKINETTE 6 STRL (DRAPES) IMPLANT
STRIP CLOSURE SKIN 1/2X4 (GAUZE/BANDAGES/DRESSINGS) ×1 IMPLANT
SUT ETHIBOND 3-0 V-5 (SUTURE) IMPLANT
SUT ETHILON 5 0 P 3 18 (SUTURE)
SUT FIBERWIRE 3-0 18 TAPR NDL (SUTURE)
SUT FIBERWIRE 4-0 18 TAPR NDL (SUTURE)
SUT MERSILENE 4 0 P 3 (SUTURE) ×1 IMPLANT
SUT MERSILENE 6 0 P 1 (SUTURE) IMPLANT
SUT MERSILENE 6 0 S14 DA (SUTURE) IMPLANT
SUT NYLON ETHILON 5-0 P-3 1X18 (SUTURE) IMPLANT
SUT POLY BUTTON 15MM (SUTURE) IMPLANT
SUT PROLENE 2 0 SH DA (SUTURE) IMPLANT
SUT PROLENE 3 0 PS 2 (SUTURE) IMPLANT
SUT PROLENE 4 0 PS 2 18 (SUTURE) ×1 IMPLANT
SUT SILK 4 0 PS 2 (SUTURE) ×1 IMPLANT
SUT VIC AB 4-0 SH 27 (SUTURE) ×2
SUT VIC AB 4-0 SH 27XANBCTRL (SUTURE) IMPLANT
SUTURE FIBERWR 3-0 18 TAPR NDL (SUTURE) IMPLANT
SUTURE FIBERWR 4-0 18 TAPR NDL (SUTURE) IMPLANT
SYR 3ML 23GX1 SAFETY (SYRINGE) IMPLANT
SYR 5ML LL (SYRINGE) IMPLANT
SYR BULB 3OZ (MISCELLANEOUS) ×2 IMPLANT
SYR CONTROL 10ML LL (SYRINGE) ×1 IMPLANT
TOWEL OR 17X24 6PK STRL BLUE (TOWEL DISPOSABLE) ×2 IMPLANT
TRAY DSU PREP LF (CUSTOM PROCEDURE TRAY) ×2 IMPLANT
TUBE FEEDING 5FR 15 INCH (TUBING) IMPLANT
UNDERPAD 30X30 INCONTINENT (UNDERPADS AND DIAPERS) ×2 IMPLANT

## 2013-03-06 NOTE — Discharge Instructions (Addendum)
[facility] Instructions Hand Surgery  Wound Care: Keep your hand elevated above the level of your heart.  Do not allow it to dangle by your side.  Keep the dressing dry and do not remove it unless your doctor advises you to do so.  He will usually change it at the time of your post-op visit.  Moving your fingers is advised to stimulate circulation but will depend on the site of your surgery.  If you have a splint applied, your doctor will advise you regarding movement.  Activity: Do not drive or operate machinery today.  Rest today and then you may return to your normal activity and work as indicated by your physician.  Diet:  Drink liquids today or eat a light diet.  You may resume a regular diet tomorrow.    General expectations: Pain for two to three days. Fingers may become slightly swollen.  Call your doctor if any of the following occur: Severe pain not relieved by pain medication. Elevated temperature. Dressing soaked with blood. Inability to move fingers. White or bluish color to fingers.  Be very careful to keep the finger bandaged dry when showering or handwashing. Use the prescription pain medication provided as needed for pain. Complete the entire course of postoperative antibiotics to prevent infection.  We will begin supervised therapy to move the last finger joint one-week postop. Our goal is to keep the pin in place for approximately 6 weeks to allow proper tendon healing. Contact our office for dressing change if there are any difficulties with the bandage.   Post Anesthesia Home Care Instructions  Activity: Get plenty of rest for the remainder of the day. A responsible adult should stay with you for 24 hours following the procedure.  For the next 24 hours, DO NOT: -Drive a car -Advertising copywriterperate machinery -Drink alcoholic beverages -Take any medication unless instructed by your physician -Make any legal decisions or sign important papers.  Meals: Start with liquid  foods such as gelatin or soup. Progress to regular foods as tolerated. Avoid greasy, spicy, heavy foods. If nausea and/or vomiting occur, drink only clear liquids until the nausea and/or vomiting subsides. Call your physician if vomiting continues.  Special Instructions/Symptoms: Your throat may feel dry or sore from the anesthesia or the breathing tube placed in your throat during surgery. If this causes discomfort, gargle with warm salt water. The discomfort should disappear within 24 hours.

## 2013-03-06 NOTE — Anesthesia Procedure Notes (Signed)
Procedure Name: LMA Insertion Date/Time: 03/06/2013 7:33 AM Performed by: Burna CashONRAD, Akeira Lahm C Pre-anesthesia Checklist: Patient identified, Emergency Drugs available, Suction available and Patient being monitored Patient Re-evaluated:Patient Re-evaluated prior to inductionOxygen Delivery Method: Circle System Utilized Preoxygenation: Pre-oxygenation with 100% oxygen Intubation Type: IV induction Ventilation: Mask ventilation without difficulty LMA: LMA inserted LMA Size: 4.0 Number of attempts: 1 Airway Equipment and Method: bite block Placement Confirmation: positive ETCO2 Tube secured with: Tape Dental Injury: Teeth and Oropharynx as per pre-operative assessment

## 2013-03-06 NOTE — Transfer of Care (Signed)
Immediate Anesthesia Transfer of Care Note  Patient: Molly Castaneda  Procedure(s) Performed: Procedure(s): LEFT LONG BOUTONNIERE REPAIR (Left)  Patient Location: PACU  Anesthesia Type:General  Level of Consciousness: awake, alert  and oriented  Airway & Oxygen Therapy: Patient Spontanous Breathing and Patient connected to face mask oxygen  Post-op Assessment: Report given to PACU RN and Post -op Vital signs reviewed and stable  Post vital signs: Reviewed and stable  Complications: No apparent anesthesia complications

## 2013-03-06 NOTE — Brief Op Note (Signed)
03/06/2013  8:44 AM  PATIENT:  Molly Castaneda  53 y.o. female  PRE-OPERATIVE DIAGNOSIS:  LEFT LONG BOUTONNIERE  POST-OPERATIVE DIAGNOSIS:  left long boutonniere  PROCEDURE:  Procedure(s): LEFT LONG BOUTONNIERE REPAIR (Left)  SURGEON:  Surgeon(s) and Role:    * Wyn Forsterobert V Skarlett Sedlacek Jr., MD - Primary  PHYSICIAN ASSISTANT:   ASSISTANTS: Mallory Shirkobert Dasnoit,P.A-C   ANESTHESIA:   general  EBL:  Total I/O In: 800 [I.V.:800] Out: -   BLOOD ADMINISTERED:none  DRAINS: none   LOCAL MEDICATIONS USED:  XYLOCAINE   SPECIMEN:  No Specimen  DISPOSITION OF SPECIMEN:  N/A  COUNTS:  YES  TOURNIQUET:   Total Tourniquet Time Documented: Upper Arm (Left) - 54 minutes Total: Upper Arm (Left) - 54 minutes   DICTATION: .Other Dictation: Dictation Number [id number]  PLAN OF CARE: Discharge to home after PACU  PATIENT DISPOSITION:  PACU - hemodynamically stable.   Delay start of Pharmacological VTE agent (>24hrs) due to surgical blood loss or risk of bleeding: not applicable

## 2013-03-06 NOTE — Anesthesia Postprocedure Evaluation (Signed)
  Anesthesia Post-op Note  Patient: Molly Castaneda  Procedure(s) Performed: Procedure(s): LEFT LONG BOUTONNIERE REPAIR (Left)  Patient Location: PACU  Anesthesia Type:General  Level of Consciousness: awake, alert  and oriented  Airway and Oxygen Therapy: Patient Spontanous Breathing  Post-op Pain: mild  Post-op Assessment: Post-op Vital signs reviewed  Post-op Vital Signs: Reviewed  Complications: No apparent anesthesia complications

## 2013-03-06 NOTE — Op Note (Signed)
[id number] 

## 2013-03-06 NOTE — Anesthesia Preprocedure Evaluation (Addendum)
Anesthesia Evaluation  Patient identified by MRN, date of birth, ID band Patient awake    Reviewed: Allergy & Precautions, H&P , NPO status , Patient's Chart, lab work & pertinent test results  History of Anesthesia Complications Negative for: history of anesthetic complications  Airway Mallampati: I TM Distance: >3 FB Neck ROM: Full    Dental  (+) Teeth Intact and Dental Advisory Given   Pulmonary neg pulmonary ROS,  breath sounds clear to auscultation        Cardiovascular hypertension, Pt. on medications + dysrhythmias Supra Ventricular Tachycardia Rhythm:Regular Rate:Normal     Neuro/Psych Depression negative neurological ROS     GI/Hepatic   Endo/Other    Renal/GU      Musculoskeletal   Abdominal   Peds  Hematology   Anesthesia Other Findings   Reproductive/Obstetrics                          Anesthesia Physical Anesthesia Plan  ASA: II  Anesthesia Plan: General   Post-op Pain Management:    Induction: Intravenous  Airway Management Planned: LMA  Additional Equipment:   Intra-op Plan:   Post-operative Plan: Extubation in OR  Informed Consent: I have reviewed the patients History and Physical, chart, labs and discussed the procedure including the risks, benefits and alternatives for the proposed anesthesia with the patient or authorized representative who has indicated his/her understanding and acceptance.   Dental advisory given  Plan Discussed with: CRNA, Anesthesiologist and Surgeon  Anesthesia Plan Comments:         Anesthesia Quick Evaluation

## 2013-03-07 NOTE — Op Note (Signed)
Molly Castaneda, Molly Castaneda                 ACCOUNT NO.:  [id number]  MEDICAL RECORD NO.:  [id number]  LOCATION:                                 FACILITY:  PHYSICIAN:  Molly Fitch. Ivalene Platte, M.D.      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  OPERATION:  Exploration of left long finger PIP joint and extensor mechanism with synovectomy of PIP joint followed by reconstruction of extensor central slip utilizing transferred central half of the radial and ulnar lateral bands utilizing Biomet juggernaut at base of middle phalanx to recreate central slip.  Also tenolysis of extensor allowing recovery of 80 degrees flexion of DIP joint followed by 0.045-inch Kirschner wire fixation of PIP joint in full extension.  OPERATING SURGEON:  Molly Fitch. Ambar Raphael, MD  ASSISTANT:  Marveen Reeks Dasnoit, PA-C  ANESTHESIA:  General by LMA.  SUPERVISING ANESTHESIOLOGIST:  Sheldon Silvan, MD.  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  She was referred through the courtesy of Dr. Kathi Der office in [facility] for management of a chronic stage III boutonniere.  She has had symptoms for more than 6 months.  She was initially evaluated by Dr. Melvyn Novas of [facility].  She started a splinting program but did not fully understand the plan.  She elected to seek an alternative consultation at [facility].  After our initial clinical evaluation, I advised her that she had a chronic stage III boutonniere with marked synovitis at the  PIP joint. We sent her for screening labs and did not identify any evidence of an inflammatory disorder.  We proceeded with a series of casts ultimately relieving a very severe flexion contracture of the PIP joint to 0 degrees.  Molly Castaneda, however, was very claustrophobic in the casts and did not tolerate splinting well.  She requested that we proceed with surgical treatment of the boutonniere.  We had discussed using a Kirschner wire across the PIP joint as an alternative to splinting.  After detailed informed consent during which she understands complications can occur including infection, PIP chondrolysis, stiffness of the finger, she proceeds at this time.  Preoperatively, she was reexamined in the holding area.  She had a 0 degree flexion contracture of the PIP joint but only had 15 degrees of active flexion of the DIP joint due to her subluxed lateral bands.  Questions were invited and answered in detail.  She was provided 2 g of Ancef as an IV prophylactic antibiotic.  She was interviewed by Dr. Ivin Booty of Anesthesia and was advised to undergo general anesthesia by LMA.  This was accepted by Molly Castaneda.  Her left long finger was marked as the proper surgical site per protocol.  Questions were invited and answered in detail.  PROCEDURE:  Molly Castaneda  [facility] and placed in supine position on the operating table.  Following the induction of general anesthesia by LMA technique, the left hand and arm were prepped with Betadine soap and solution and sterilely draped.  A pneumatic tourniquet was applied to the proximal left brachium.  Following exsanguination of the left arm with Esmarch bandage, arterial tourniquet was inflated to 220 mmHg.  A routine surgical time-out was accomplished.  The extensor mechanism of the left long finger was then exposed with a bayonet type incision.  There was quite a bit of edema and fibrosis.  The lateral bands  were subluxed well palmar to the axis of rotation of the PIP joint.  With great care, the skin edges were retracted and the neurovascular structures protected.  The transverse retinacular fibers were released from the diaphysis of the middle phalanx to the base of the proximal phalanx allowing mobilization of the lateral bands.  The central slip was very attenuated and had very poor quality tissue.  We subsequently performed a capsulotomy of the PIP joint and performed a complete synovectomy.  There was moderate degenerative arthritis of the PIP joint primarily on the dorsal surface of the proximal phalanx.  We elected to perform a tendon transfer utilizing the central half of the radial and ulnar lateral bands.  We identified that after release of the transverse retinacular fibers, we could passively flex the DIP joint 80 degrees.  We then performed a football shaped resection of the attenuated central slip and then transferred the central half of the radial ulnar lateral band to the midline.  A Biomet small juggernaut was placed with paired sutures at the base of the middle phalanx with x-ray confirmation of position.  We placed a 0.045-inch Kirschner wire, and had a technical issue of wrapping a suture with that Kirschner wire. Therefore, a fresh juggernaut was placed.  The finger was placed in a position of PIP full extension, secured with a 0.045 inch Kirschner wire, followed by flexion of the DIP joint to 60 degrees and reconstruction of the central slip with the lateral band slips.  The tension was adjusted appropriately to allow slight extensor lag at the DIP joint.  We then performed a series of modified Kessler grasping sutures securing the recreated central slip and repaired the central slip both to the juggernaut and finishing suture to the triangular ligament.  A 4-0 Mersilene was then used to reapproximate the remaining lateral bands to the recreated central slip with a  single figure-of-eight suture directly at the PIP joint collateral ligament level.  We then tested our range of motion.  There was 0-80 degrees passive flexion of the DIP joint with this construct.  The wound was thoroughly irrigated followed by repair of the skin with subcutaneous 4-0 Vicryl and intradermal 4-0 Prolene.  Steri-Strips were applied. The finger was anesthetized with 2% lidocaine for postoperative comfort. The finger was then dressed with a sterile gauze dressing with Coban and Alumafoam splint to protect the pin, Xeroflo on the pin, and finishing Coban.  There were no apparent complications.     Molly Castaneda, M.D.     RVS/MEDQ  D:  03/06/2013  T:  03/07/2013  Job:  [id number]  cc:   Ernestina Pennaonald W. Moore, M.D.

## 2013-03-09 ENCOUNTER — Encounter (HOSPITAL_BASED_OUTPATIENT_CLINIC_OR_DEPARTMENT_OTHER): Payer: Self-pay | Admitting: Orthopedic Surgery

## 2013-03-11 ENCOUNTER — Ambulatory Visit: Payer: Medicare Other

## 2013-05-11 ENCOUNTER — Encounter: Payer: Self-pay | Admitting: General Practice

## 2013-05-11 ENCOUNTER — Ambulatory Visit (INDEPENDENT_AMBULATORY_CARE_PROVIDER_SITE_OTHER): Payer: Medicare HMO | Admitting: General Practice

## 2013-05-11 ENCOUNTER — Telehealth: Payer: Self-pay | Admitting: General Practice

## 2013-05-11 VITALS — BP 122/74 | HR 112 | Temp 98.5°F | Ht 68.0 in | Wt 198.8 lb

## 2013-05-11 DIAGNOSIS — R5383 Other fatigue: Secondary | ICD-10-CM

## 2013-05-11 DIAGNOSIS — R5381 Other malaise: Secondary | ICD-10-CM

## 2013-05-11 DIAGNOSIS — R635 Abnormal weight gain: Secondary | ICD-10-CM

## 2013-05-11 NOTE — Telephone Encounter (Signed)
appt today at 10:30 with Mae

## 2013-05-11 NOTE — Patient Instructions (Signed)

## 2013-05-11 NOTE — Progress Notes (Signed)
   Subjective:    Patient ID: Molly Castaneda, female    DOB: 09-07-1960, 53 y.o.   MRN: [id number]  HPI Patient presents today with complaints of weight gain, hot flashes, malaise, and fatigue. Reports walking on treadmill 30-40 minutes, 3 days a week. Trying to eat healthier, but needs assistance with healthy eating plan. Hysterectomy (partial) in 1990's.     Review of Systems  Constitutional: Negative for fever and chills.  Respiratory: Negative for chest tightness and shortness of breath.   Cardiovascular: Negative for chest pain and palpitations.       Objective:   Physical Exam  Constitutional: She is oriented to person, place, and time. She appears well-developed and well-nourished.  Neck: Normal range of motion. Neck supple. No thyromegaly present.  Cardiovascular: Regular rhythm and normal heart sounds.  Tachycardia present.   Pulmonary/Chest: Effort normal and breath sounds normal. No respiratory distress. She exhibits no tenderness.  Abdominal: Soft. Bowel sounds are normal. She exhibits no distension and no mass.  Lymphadenopathy:    She has no cervical adenopathy.  Neurological: She is alert and oriented to person, place, and time.  Skin: Skin is warm and dry.  Psychiatric: She has a normal mood and affect.          Assessment & Plan:  1. Weight gain, 2. Other malaise and fatigue  - Thyroid Panel With TSH -discussed healthy eating and regular exercise -make appt with nutritionist to discuss healthy eating plans -RTO if symptoms worsen  -may seek emergency medical treatement Patient verbalized understanding Coralie KeensMae E. Kadisha Goodine, FNP-C

## 2013-05-12 LAB — THYROID PANEL WITH TSH
FREE THYROXINE INDEX: 2.3 (ref 1.2–4.9)
T3 UPTAKE RATIO: 30 % (ref 24–39)
T4 TOTAL: 7.5 ug/dL (ref 4.5–12.0)
TSH: 1.08 u[IU]/mL (ref 0.450–4.500)

## 2013-05-19 ENCOUNTER — Emergency Department (HOSPITAL_COMMUNITY)
Admission: EM | Admit: 2013-05-19 | Discharge: 2013-05-19 | Disposition: A | Discharge status: Home / Self Care | Payer: Medicare HMO | Attending: Emergency Medicine | Admitting: Emergency Medicine

## 2013-05-19 ENCOUNTER — Encounter (HOSPITAL_COMMUNITY): Payer: Self-pay | Admitting: Emergency Medicine

## 2013-05-19 ENCOUNTER — Emergency Department (HOSPITAL_COMMUNITY): Payer: Medicare HMO

## 2013-05-19 DIAGNOSIS — J029 Acute pharyngitis, unspecified: Secondary | ICD-10-CM | POA: Insufficient documentation

## 2013-05-19 DIAGNOSIS — F3289 Other specified depressive episodes: Secondary | ICD-10-CM | POA: Insufficient documentation

## 2013-05-19 DIAGNOSIS — Z79899 Other long term (current) drug therapy: Secondary | ICD-10-CM | POA: Insufficient documentation

## 2013-05-19 DIAGNOSIS — M129 Arthropathy, unspecified: Secondary | ICD-10-CM | POA: Insufficient documentation

## 2013-05-19 DIAGNOSIS — I10 Essential (primary) hypertension: Secondary | ICD-10-CM | POA: Insufficient documentation

## 2013-05-19 DIAGNOSIS — G8929 Other chronic pain: Secondary | ICD-10-CM | POA: Insufficient documentation

## 2013-05-19 DIAGNOSIS — J392 Other diseases of pharynx: Secondary | ICD-10-CM

## 2013-05-19 DIAGNOSIS — Z8719 Personal history of other diseases of the digestive system: Secondary | ICD-10-CM | POA: Insufficient documentation

## 2013-05-19 DIAGNOSIS — E559 Vitamin D deficiency, unspecified: Secondary | ICD-10-CM | POA: Insufficient documentation

## 2013-05-19 DIAGNOSIS — R6889 Other general symptoms and signs: Secondary | ICD-10-CM | POA: Insufficient documentation

## 2013-05-19 DIAGNOSIS — F329 Major depressive disorder, single episode, unspecified: Secondary | ICD-10-CM | POA: Insufficient documentation

## 2013-05-19 DIAGNOSIS — Z7982 Long term (current) use of aspirin: Secondary | ICD-10-CM | POA: Insufficient documentation

## 2013-05-19 NOTE — ED Provider Notes (Addendum)
CSN: [id number]     Arrival date & time 05/19/13  1015 History  This chart was scribed for non-physician practitioner Emilia BeckKaitlyn Malaya Cagley, PA-C working with Hilario Quarryanielle S Ray, MD by Leone PayorSonum Patel, ED Scribe. This patient was seen in room TR07C/TR07C and the patient's care was started at 11:47 AM.    Chief Complaint  Patient presents with  . Swallowed Foreign Body      The history is provided by the patient. No language interpreter was used.   HPI Comments: Molly Castaneda is a 53 y.o. female who presents to the Emergency Department complaining of a possible foreign body swallowed this morning. She states she reached for her medicine tray but accidentally grabbed her jewelry dish instead. She reports feeling something in her throat but is unsure because she has sinus drainage that began last night. She is uncertain if the earring is lodged in her throat or if it is just sinus drainage. She spoke with a nurse on the phone who advised her to come the ED to be evaluated.   Past Medical History  Diagnosis Date  . Chronic constipation   . MVP (mitral valve prolapse)     had ablation 1998  . Vitamin D deficiency   . Depression   . Arthritis   . Numbness in left leg     s/p spinal surgery  . Hypertension   . Supraventricular tachycardia     ablation 1998  . Chronic back pain    Past Surgical History  Procedure Laterality Date  . Av node ablation  1998    for svt  . Partial hysterectomy  1993  . Stimulation system implant  07/31/00    lumbar nerve stimulator-none funtioning now  . Back surgery  2001    lumbar fusion  . Repair extensor tendon Left 03/06/2013    Procedure: LEFT LONG BOUTONNIERE REPAIR;  Surgeon: Wyn Forsterobert V Sypher Jr., MD;  Location: [facility];  Service: Orthopedics;  Laterality: Left;   Family History  Problem Relation Age of Onset  . Coronary artery disease Father   . Prostate cancer Maternal Grandfather   . Hodgkin's lymphoma Son   . Liver cancer Paternal  Uncle     ?  Marland Kitchen. Colon polyps Father   . Colon cancer Neg Hx    History  Substance Use Topics  . Smoking status: Never Smoker   . Smokeless tobacco: Never Used  . Alcohol Use: No   OB History   Grav Para Term Preterm Abortions TAB SAB Ect Mult Living                 Review of Systems  Constitutional: Negative for fever.  HENT: Positive for postnasal drip, rhinorrhea and sore throat.   Gastrointestinal: Negative for abdominal pain.  All other systems reviewed and are negative.     Allergies  Oxycodone-acetaminophen and Oxycontin  Home Medications   Prior to Admission medications   Medication Sig Start Date End Date Taking? Authorizing Provider  amphetamine-dextroamphetamine (ADDERALL) 10 MG tablet Take 10 mg by mouth daily with breakfast.  06/01/12   Historical Provider, MD  aspirin 81 MG tablet Take 81 mg by mouth daily.    Historical Provider, MD  buPROPion (WELLBUTRIN SR) 150 MG 12 hr tablet Take 300 mg by mouth daily.      Historical Provider, MD  Cholecalciferol (VITAMIN D3) 2000 UNITS TABS Take 1 tablet by mouth daily.      Historical Provider, MD  LamoTRIgine 100 MG  TBDP Take 100 mg by mouth daily.    Historical Provider, MD  morphine (MSIR) 15 MG tablet Take 15 mg by mouth 3 (three) times daily.     Historical Provider, MD  Omega 3-6-9 Fatty Acids (OMEGA 3-6-9 COMPLEX) CAPS Take 1 capsule by mouth daily.      Historical Provider, MD  oxyCODONE-acetaminophen (PERCOCET/ROXICET) 5-325 MG per tablet 1 or 2 tabs every 4 hours as needed for pain 03/06/13   Marveen Reeksobert J Dasnoit, PA-C  Potassium 99 MG TABS Take 1 tablet by mouth daily.      Historical Provider, MD  tiZANidine (ZANAFLEX) 4 MG tablet Take 4 mg by mouth daily.      Historical Provider, MD  vitamin B-12 (CYANOCOBALAMIN) 1000 MCG tablet Take 1,000 mcg by mouth daily.      Historical Provider, MD   Temp(Src) 98.2 F (36.8 C) (Oral) Physical Exam  Nursing note and vitals reviewed. Constitutional: She is oriented to  person, place, and time. She appears well-developed and well-nourished.  HENT:  Head: Normocephalic and atraumatic.  Mouth/Throat: Oropharynx is clear and moist. No oropharyngeal exudate.  No erythema or edema of tonsils of posterior pharynx.   Eyes: EOM are normal.  Neck: Normal range of motion. Neck supple. No tracheal deviation present.  Cardiovascular: Normal rate.   Pulmonary/Chest: Effort normal.  Abdominal: She exhibits no distension.  Lymphadenopathy:    She has no cervical adenopathy.  Neurological: She is alert and oriented to person, place, and time.  Skin: Skin is warm and dry.  Psychiatric: She has a normal mood and affect.    ED Course  Procedures (including critical care time)  DIAGNOSTIC STUDIES: None performed.    COORDINATION OF CARE: 11:47 AM Discussed treatment plan with pt at bedside and pt agreed to plan.   Labs Review Labs Reviewed - No data to display  Imaging Review Dg Neck Soft Tissue  05/19/2013   CLINICAL DATA:  SWALLOWED FOREIGN BODY  EXAM: NECK SOFT TISSUES - 1+ VIEW  COMPARISON:  None.  FINDINGS: There is no evidence of retropharyngeal soft tissue swelling or epiglottic enlargement. The cervical airway is unremarkable and no radio-opaque foreign body identified.  IMPRESSION: Negative.   Electronically Signed   By: Salome HolmesHector  Cooper M.D.   On: 05/19/2013 11:35   Dg Chest 1 View  05/19/2013   CLINICAL DATA:  Pain; swallowed foreign body  EXAM: CHEST - 1 VIEW  COMPARISON:  None.  FINDINGS: Lungs are clear. Heart size and pulmonary vascularity are normal. No adenopathy. No pneumothorax. No bone lesions. There is a thoracic stimulator with the tip overlying T10-11. Beyond the stimulator, there is no radiopaque foreign body. There is mild upper thoracic levoscoliosis.  IMPRESSION: Thoracic stimulator present. No other radiopaque foreign body. Lungs clear.   Electronically Signed   By: Bretta BangWilliam  Woodruff M.D.   On: 05/19/2013 11:30     EKG  Interpretation None      MDM   Final diagnoses:  Throat irritation    No foreign body noted on plain films. Patient will likely pass the earring without problems, if she did swallow it. Patient advised to take OTC antihistamines for irritation relief. Vitals stable and patient afebrile.   I personally performed the services described in this documentation, which was scribed in my presence. The recorded information has been reviewed and is accurate.   Emilia BeckKaitlyn Aesha Agrawal, PA-C 05/19/13 [address]  Bindi Klomp, [place] JerseyPA-C 06/01/13 [phone]

## 2013-05-19 NOTE — ED Notes (Addendum)
Pt states she began having post nasal drip yesterday causing some throat tenderness.  This morning pt accidentally swallowed an earring when she thought she grabbed her med dish, she grabbed her jewelry dish instead.  Pt states she feels something in her throat, but is uncertain if it's the earring or her sinus drainage.

## 2013-05-19 NOTE — Discharge Instructions (Signed)
Return to the ED with worsening or concerning symptoms.  °

## 2013-05-20 NOTE — ED Provider Notes (Signed)
History/physical exam/procedure(s) were performed by non-physician practitioner and as supervising physician I was immediately available for consultation/collaboration. I have reviewed all notes and am in agreement with care and plan.   Hilario Quarryanielle S Donnel Venuto, MD 05/20/13 1150

## 2013-05-26 ENCOUNTER — Ambulatory Visit
Admission: RE | Admit: 2013-05-26 | Discharge: 2013-05-26 | Disposition: A | Discharge status: Home / Self Care | Payer: Medicare HMO | Source: Ambulatory Visit

## 2013-05-26 DIAGNOSIS — Z1231 Encounter for screening mammogram for malignant neoplasm of breast: Secondary | ICD-10-CM

## 2013-06-02 ENCOUNTER — Ambulatory Visit: Payer: Self-pay

## 2013-06-02 ENCOUNTER — Ambulatory Visit: Payer: Medicare HMO

## 2013-06-02 NOTE — ED Provider Notes (Signed)
History/physical exam/procedure(s) were performed by non-physician practitioner and as supervising physician I was immediately available for consultation/collaboration. I have reviewed all notes and am in agreement with care and plan.   Hilario Quarryanielle S Chett Taniguchi, MD 06/02/13 [phone]

## 2013-06-03 ENCOUNTER — Ambulatory Visit: Payer: Medicare HMO

## 2013-06-09 ENCOUNTER — Ambulatory Visit (INDEPENDENT_AMBULATORY_CARE_PROVIDER_SITE_OTHER): Payer: Medicare HMO | Admitting: Pharmacist Clinician (PhC)/ Clinical Pharmacy Specialist

## 2013-06-09 ENCOUNTER — Encounter: Payer: Self-pay | Admitting: Pharmacist Clinician (PhC)/ Clinical Pharmacy Specialist

## 2013-06-09 ENCOUNTER — Encounter: Payer: Self-pay | Admitting: Gastroenterology

## 2013-06-09 DIAGNOSIS — E669 Obesity, unspecified: Secondary | ICD-10-CM

## 2013-06-09 NOTE — Progress Notes (Signed)
Ms. Rana SnareLowe is presenting today for a weight loss consultation and questions regarding medications for weight loss.  She is currently taking adderal and bupropion and has tried ALLY in the past with limited success.  She was skinny when young and her mom is thin and dad and siblings normal body weight, no obesity in the family.  She is 198lb and 5'9" with a BMI of 30.  [facility] [place] [facility] and lost down to 125lb and then gained in back starting in 2002.  She would like to weigh between 145-150lb which is her IBW.  She does not eat lunch an has oatmeal for breakfast.  Dinner is her largest meal and she eats ice cream and sweets in the evening with her husband who is very thin.  Patient has a treadmill and bike that she uses 30-45 minutes a day.  She does not like the following vegetbales:  Peas, lima beans, zucchini, asparagus, brussel sprouts, cauliflower.  Likes diet sierra mist or flavored water.  Likes ketchup and mayo.  Started HRT last week for menopausal symptoms and has already seen a big improvement.  Will make an individualized meal plan for patient to follow with exercise and follow up in 4 weeks to assess weight loss.

## 2013-09-29 ENCOUNTER — Encounter (HOSPITAL_COMMUNITY): Payer: Self-pay | Admitting: Emergency Medicine

## 2013-09-29 ENCOUNTER — Emergency Department (HOSPITAL_COMMUNITY)
Admission: EM | Admit: 2013-09-29 | Discharge: 2013-09-29 | Disposition: A | Discharge status: Home / Self Care | Payer: Medicare HMO | Attending: Emergency Medicine | Admitting: Emergency Medicine

## 2013-09-29 ENCOUNTER — Emergency Department (HOSPITAL_COMMUNITY): Payer: Medicare HMO

## 2013-09-29 DIAGNOSIS — Z7982 Long term (current) use of aspirin: Secondary | ICD-10-CM | POA: Insufficient documentation

## 2013-09-29 DIAGNOSIS — G8929 Other chronic pain: Secondary | ICD-10-CM | POA: Diagnosis not present

## 2013-09-29 DIAGNOSIS — M7989 Other specified soft tissue disorders: Secondary | ICD-10-CM | POA: Diagnosis not present

## 2013-09-29 DIAGNOSIS — R079 Chest pain, unspecified: Secondary | ICD-10-CM | POA: Diagnosis present

## 2013-09-29 DIAGNOSIS — E559 Vitamin D deficiency, unspecified: Secondary | ICD-10-CM | POA: Insufficient documentation

## 2013-09-29 DIAGNOSIS — R0789 Other chest pain: Secondary | ICD-10-CM

## 2013-09-29 DIAGNOSIS — Z8719 Personal history of other diseases of the digestive system: Secondary | ICD-10-CM | POA: Insufficient documentation

## 2013-09-29 DIAGNOSIS — F3289 Other specified depressive episodes: Secondary | ICD-10-CM | POA: Diagnosis not present

## 2013-09-29 DIAGNOSIS — M129 Arthropathy, unspecified: Secondary | ICD-10-CM | POA: Insufficient documentation

## 2013-09-29 DIAGNOSIS — F329 Major depressive disorder, single episode, unspecified: Secondary | ICD-10-CM | POA: Diagnosis not present

## 2013-09-29 DIAGNOSIS — I10 Essential (primary) hypertension: Secondary | ICD-10-CM | POA: Insufficient documentation

## 2013-09-29 DIAGNOSIS — Z79899 Other long term (current) drug therapy: Secondary | ICD-10-CM | POA: Diagnosis not present

## 2013-09-29 DIAGNOSIS — R110 Nausea: Secondary | ICD-10-CM | POA: Insufficient documentation

## 2013-09-29 LAB — BASIC METABOLIC PANEL
Anion gap: 12 (ref 5–15)
BUN: 13 mg/dL (ref 6–23)
CALCIUM: 8.7 mg/dL (ref 8.4–10.5)
CO2: 24 mEq/L (ref 19–32)
Chloride: 100 mEq/L (ref 96–112)
Creatinine, Ser: 0.72 mg/dL (ref 0.50–1.10)
GFR calc Af Amer: >90 mL/min (ref >90)
GLUCOSE: 97 mg/dL (ref 70–99)
Potassium: 3.9 mEq/L (ref 3.7–5.3)
Sodium: 136 mEq/L — ABNORMAL LOW (ref 137–147)

## 2013-09-29 LAB — I-STAT TROPONIN, ED
Troponin i, poc: 0 ng/mL (ref 0.00–0.08)
Troponin i, poc: 0 ng/mL (ref 0.00–0.08)

## 2013-09-29 LAB — CBC
HEMATOCRIT: 39.3 % (ref 36.0–46.0)
HEMOGLOBIN: 13.1 g/dL (ref 12.0–15.0)
MCH: 26.9 pg (ref 26.0–34.0)
MCHC: 33.3 g/dL (ref 30.0–36.0)
MCV: 80.7 fL (ref 78.0–100.0)
Platelets: 333 10*3/uL (ref 150–400)
RBC: 4.87 MIL/uL (ref 3.87–5.11)
RDW: 13.7 % (ref 11.5–15.5)
WBC: 7.5 10*3/uL (ref 4.0–10.5)

## 2013-09-29 MED ORDER — ASPIRIN 81 MG PO CHEW
324.0000 mg | CHEWABLE_TABLET | Freq: Once | ORAL | Status: AC
  Administered 2013-09-29: 324 mg via ORAL
  Filled 2013-09-29: qty 4

## 2013-09-29 NOTE — Discharge Instructions (Signed)

## 2013-09-29 NOTE — ED Notes (Signed)
Pt reports cp began 4 days ago, left sided, comes and goes.  Yesterday was the worst day per pt; woke this morning with some nausea which has passed.

## 2013-09-29 NOTE — ED Notes (Signed)
Pt reports left side chest pains x 3 days. Occurs under her breast and radiates around to left side and back, feels like a tightness with mild sob, denies recent cough. ekg done at triage and airway intact.

## 2013-09-29 NOTE — ED Provider Notes (Signed)
TIME SEEN: 7:50 PM  CHIEF COMPLAINT: Chest pain  HPI: Patient is a 53 y.o. F with history of mitral valve prolapse, chronic pain, SVT status post ablation in 1998 who presents to the emergency department with complaints of left-sided chest pain under her breast that she describes as a tightness. He does intermittently radiate into her back. She does have some shortness of breath. Denies lightheadedness or diaphoresis. She does have some nausea today. No recent cough. She has had some mild lower extremity swelling for which she takes Lasix. No history of CHF. Denies a history of hypertension, diabetes, hyperlipidemia or tobacco use. States that her father had cardiac disease in his 36s. No history of PE or DVT, recent prolonged immobilization such as long flight or hospitalization, fracture, trauma trauma. She states that her pain was persistent from yesterday afternoon until this morning but is now gone. It is not exertional or pleuritic. She is not sure what made her pain resolved. She states her last stress test was many years ago. Denies a history of cardiac catheterization.  ROS: See HPI Constitutional: no fever  Eyes: no drainage  ENT: no runny nose   Cardiovascular:   chest pain  Resp:  SOB  GI: no vomiting GU: no dysuria Integumentary: no rash  Allergy: no hives  Musculoskeletal: no leg swelling  Neurological: no slurred speech ROS otherwise negative  PAST MEDICAL HISTORY/PAST SURGICAL HISTORY:  Past Medical History  Diagnosis Date  . Chronic constipation   . MVP (mitral valve prolapse)     had ablation 1998  . Vitamin D deficiency   . Depression   . Arthritis   . Numbness in left leg     s/p spinal surgery  . Hypertension   . Supraventricular tachycardia     ablation 1998  . Chronic back pain     MEDICATIONS:  Prior to Admission medications   Medication Sig Start Date End Date Taking? Authorizing Provider  amphetamine-dextroamphetamine (ADDERALL) 10 MG tablet Take 10  mg by mouth daily with breakfast.  06/01/12   Historical Provider, MD  aspirin 81 MG tablet Take 81 mg by mouth daily.    Historical Provider, MD  buPROPion (WELLBUTRIN SR) 150 MG 12 hr tablet Take 150 mg by mouth at bedtime.     Historical Provider, MD  Cholecalciferol (VITAMIN D3) 2000 UNITS TABS Take 2,000 Units by mouth daily.     Historical Provider, MD  furosemide (LASIX) 40 MG tablet Take 40 mg by mouth 3 (three) times daily as needed for fluid or edema.    Historical Provider, MD  LamoTRIgine 100 MG TBDP Take 100 mg by mouth daily.    Historical Provider, MD  morphine (MSIR) 15 MG tablet Take 15 mg by mouth 3 (three) times daily.     Historical Provider, MD  Omega 3-6-9 Fatty Acids (OMEGA 3-6-9 COMPLEX) CAPS Take 1 capsule by mouth daily.      Historical Provider, MD  Phenyleph-CPM-DM-APAP (TYLENOL COLD HEAD CONGESTION PO) Take 2 tablets by mouth 2 (two) times daily as needed (cold symptoms).    Historical Provider, MD  Potassium 99 MG TABS Take 1 tablet by mouth daily.     Historical Provider, MD  tiZANidine (ZANAFLEX) 4 MG tablet Take 4 mg by mouth daily as needed for muscle spasms.     Historical Provider, MD  vitamin B-12 (CYANOCOBALAMIN) 1000 MCG tablet Take 1,000 mcg by mouth daily.      Historical Provider, MD    ALLERGIES:  Allergies  Allergen Reactions  . Oxycodone-Acetaminophen Other (See Comments)    Chest Pains  . Oxycontin [Oxycodone Hcl] Other (See Comments)    Causes chest pain    SOCIAL HISTORY:  History  Substance Use Topics  . Smoking status: Never Smoker   . Smokeless tobacco: Never Used  . Alcohol Use: No    FAMILY HISTORY: Family History  Problem Relation Age of Onset  . Coronary artery disease Father   . Prostate cancer Maternal Grandfather   . Hodgkin's lymphoma Son   . Liver cancer Paternal Uncle     ?  Marland Kitchen Colon polyps Father   . Colon cancer Neg Hx     EXAM: BP 133/76  Pulse 94  Temp(Src) 98 F (36.7 C) (Oral)  Resp 18  SpO2  100% CONSTITUTIONAL: Alert and oriented and responds appropriately to questions. Well-appearing; well-nourished HEAD: Normocephalic EYES: Conjunctivae clear, PERRL ENT: normal nose; no rhinorrhea; moist mucous membranes; pharynx without lesions noted NECK: Supple, no meningismus, no LAD  CARD: RRR; S1 and S2 appreciated; no murmurs, no clicks, no rubs, no gallops RESP: Normal chest excursion without splinting or tachypnea; breath sounds clear and equal bilaterally; no wheezes, no rhonchi, no rales, chest wall is nontender to palpation without crepitus or ecchymosis or deformity ABD/GI: Normal bowel sounds; non-distended; soft, non-tender, no rebound, no guarding BACK:  The back appears normal and is non-tender to palpation, there is no CVA tenderness EXT: Normal ROM in all joints; non-tender to palpation; no edema; normal capillary refill; no cyanosis    SKIN: Normal color for age and race; warm NEURO: Moves all extremities equally PSYCH: The patient's mood and manner are appropriate. Grooming and personal hygiene are appropriate.  MEDICAL DECISION MAKING: Patient here with chest pain shortness of breath. She has no risk factors for ACS other than a family history. Her pain was persistent from last night until this morning but is now completely resolved. Her first troponin is negative. Her chest x-ray is clear. Doubt dissection or PE given she is pain free without intervention. She is hemodynamically stable. Will repeat second troponin. If negative, I feel patient can be discharged home for outpatient stress test as her heart score is 3.  Patient comfortable with this plan.  ED PROGRESS: Patient's second troponin is negative. She still asymptomatic. She is ready for discharge home. She states she would prefer discharge over admission. I feel this is a reasonable plan given she has low risk factors for ACS. Have discussed with her that she needs close followup with her PCP for an outpatient stress  test. She verbalized understanding is comfortable with this plan.    EKG Interpretation  Date/Time:  Tuesday September 29 2013 16:53:55 EDT Ventricular Rate:  91 PR Interval:  138 QRS Duration: 94 QT Interval:  352 QTC Calculation: 432 R Axis:   39 Text Interpretation:  Normal sinus rhythm Right atrial enlargement Cannot rule out Anterior infarct , age undetermined  which is unchanged since 1999 Abnormal ECG Confirmed by WARD,  DO, KRISTEN [phone]) on 09/29/2013 7:43:16 PM           Layla Maw Ward, DO 09/29/13 2118

## 2013-10-16 DIAGNOSIS — M25572 Pain in left ankle and joints of left foot: Secondary | ICD-10-CM | POA: Insufficient documentation

## 2013-11-04 ENCOUNTER — Telehealth: Payer: Self-pay | Admitting: Family Medicine

## 2013-11-04 DIAGNOSIS — M545 Low back pain: Secondary | ICD-10-CM

## 2013-11-04 NOTE — Telephone Encounter (Signed)
[facility]----Dr. Victorino DikeHewitt

## 2013-11-04 NOTE — Telephone Encounter (Signed)
Referral put in patient aware  

## 2013-12-13 ENCOUNTER — Encounter (HOSPITAL_COMMUNITY): Payer: Self-pay

## 2013-12-13 ENCOUNTER — Emergency Department (HOSPITAL_COMMUNITY)
Admission: EM | Admit: 2013-12-13 | Discharge: 2013-12-13 | Disposition: A | Discharge status: Home / Self Care | Payer: Medicare HMO | Attending: Emergency Medicine | Admitting: Emergency Medicine

## 2013-12-13 DIAGNOSIS — Z79899 Other long term (current) drug therapy: Secondary | ICD-10-CM | POA: Diagnosis not present

## 2013-12-13 DIAGNOSIS — I8001 Phlebitis and thrombophlebitis of superficial vessels of right lower extremity: Secondary | ICD-10-CM | POA: Insufficient documentation

## 2013-12-13 DIAGNOSIS — M1990 Unspecified osteoarthritis, unspecified site: Secondary | ICD-10-CM | POA: Insufficient documentation

## 2013-12-13 DIAGNOSIS — Z7982 Long term (current) use of aspirin: Secondary | ICD-10-CM | POA: Insufficient documentation

## 2013-12-13 DIAGNOSIS — E559 Vitamin D deficiency, unspecified: Secondary | ICD-10-CM | POA: Diagnosis not present

## 2013-12-13 DIAGNOSIS — I10 Essential (primary) hypertension: Secondary | ICD-10-CM | POA: Insufficient documentation

## 2013-12-13 DIAGNOSIS — F329 Major depressive disorder, single episode, unspecified: Secondary | ICD-10-CM | POA: Insufficient documentation

## 2013-12-13 DIAGNOSIS — Z791 Long term (current) use of non-steroidal anti-inflammatories (NSAID): Secondary | ICD-10-CM | POA: Diagnosis not present

## 2013-12-13 DIAGNOSIS — G8929 Other chronic pain: Secondary | ICD-10-CM | POA: Insufficient documentation

## 2013-12-13 DIAGNOSIS — Z8719 Personal history of other diseases of the digestive system: Secondary | ICD-10-CM | POA: Insufficient documentation

## 2013-12-13 DIAGNOSIS — I809 Phlebitis and thrombophlebitis of unspecified site: Secondary | ICD-10-CM

## 2013-12-13 DIAGNOSIS — M79609 Pain in unspecified limb: Secondary | ICD-10-CM

## 2013-12-13 DIAGNOSIS — M7989 Other specified soft tissue disorders: Secondary | ICD-10-CM | POA: Diagnosis present

## 2013-12-13 NOTE — ED Provider Notes (Signed)
CSN: [id number]     Arrival date & time 12/13/13  1100 History   First MD Initiated Contact with Patient 12/13/13 1112     Chief Complaint  Patient presents with  . Leg Swelling     (Consider location/radiation/quality/duration/timing/severity/associated sxs/prior Treatment) The history is provided by the patient.  pt c/o feeling as if left lower leg is swollen today, and also notes firm area over vein on medial aspect left lower leg.  Symptoms constant, persistent since this morning, pt denies any specific exacerbating or alleviating factors.  Denies leg pain. Denies injury or trauma. Denies fever or chills. No chest discomfort/pain or trouble breathing. No hx dvt or pe.       Past Medical History  Diagnosis Date  . Chronic constipation   . MVP (mitral valve prolapse)     had ablation 1998  . Vitamin D deficiency   . Depression   . Arthritis   . Numbness in left leg     s/p spinal surgery  . Hypertension   . Supraventricular tachycardia     ablation 1998  . Chronic back pain    Past Surgical History  Procedure Laterality Date  . Av node ablation  1998    for svt  . Partial hysterectomy  1993  . Stimulation system implant  07/31/00    lumbar nerve stimulator-none funtioning now  . Back surgery  2001    lumbar fusion  . Repair extensor tendon Left 03/06/2013    Procedure: LEFT LONG BOUTONNIERE REPAIR;  Surgeon: Wyn Forsterobert V Sypher Jr., MD;  Location: [facility];  Service: Orthopedics;  Laterality: Left;   Family History  Problem Relation Age of Onset  . Coronary artery disease Father   . Prostate cancer Maternal Grandfather   . Hodgkin's lymphoma Son   . Liver cancer Paternal Uncle     ?  Marland Kitchen. Colon polyps Father   . Colon cancer Neg Hx    History  Substance Use Topics  . Smoking status: Never Smoker   . Smokeless tobacco: Never Used  . Alcohol Use: No   OB History    No data available     Review of Systems  Constitutional: Negative for fever.   Respiratory: Negative for shortness of breath.   Cardiovascular: Negative for chest pain.  Skin: Negative for rash and wound.  Neurological: Negative for weakness and numbness.      Allergies  Oxycodone-acetaminophen and Oxycontin  Home Medications   Prior to Admission medications   Medication Sig Start Date End Date Taking? Authorizing Provider  amphetamine-dextroamphetamine (ADDERALL) 10 MG tablet Take 10 mg by mouth 2 (two) times daily with a meal.  06/01/12   Historical Provider, MD  aspirin 81 MG tablet Take 81 mg by mouth daily.    Historical Provider, MD  buPROPion (WELLBUTRIN SR) 150 MG 12 hr tablet Take 150 mg by mouth 2 (two) times daily.     Historical Provider, MD  Cholecalciferol (VITAMIN D3) 2000 UNITS TABS Take 2,000 Units by mouth daily.     Historical Provider, MD  estradiol (ESTRACE) 1 MG tablet Take 1 mg by mouth daily. 09/25/13   Historical Provider, MD  furosemide (LASIX) 40 MG tablet Take 40 mg by mouth 3 (three) times daily as needed for fluid or edema.    Historical Provider, MD  meloxicam (MOBIC) 7.5 MG tablet Take 7.5 mg by mouth daily. 09/17/13   Historical Provider, MD  morphine (MSIR) 15 MG tablet Take 15 mg by  mouth 3 (three) times daily.     Historical Provider, MD  Omega 3-6-9 Fatty Acids (OMEGA 3-6-9 COMPLEX) CAPS Take 1 capsule by mouth daily.      Historical Provider, MD  Potassium 99 MG TABS Take 1 tablet by mouth daily.     Historical Provider, MD  tiZANidine (ZANAFLEX) 4 MG tablet Take 4 mg by mouth daily as needed for muscle spasms.     Historical Provider, MD  vitamin B-12 (CYANOCOBALAMIN) 1000 MCG tablet Take 1,000 mcg by mouth daily.      Historical Provider, MD   There were no vitals taken for this visit. Physical Exam  Constitutional: She appears well-developed and well-nourished. No distress.  Eyes: Conjunctivae are normal. No scleral icterus.  Neck: Neck supple. No tracheal deviation present.  Cardiovascular: Normal rate, regular rhythm,  normal heart sounds and intact distal pulses.  Exam reveals no gallop and no friction rub.   No murmur heard. Pulmonary/Chest: Effort normal and breath sounds normal. No respiratory distress.  Abdominal: Normal appearance. She exhibits no distension.  Musculoskeletal: She exhibits no edema.  Pt with firm, venous cord palpable at medial aspect proximal left lower leg, +varicose veins. No gross leg or lower leg edema noted as compared to right. Distal pulses palp. No cellulitis.   Neurological: She is alert.  Skin: Skin is warm and dry. No rash noted. She is not diaphoretic.  Psychiatric: She has a normal mood and affect.  Nursing note and vitals reviewed.   ED Course  Procedures (including critical care time) Labs Review   Author: Kern Albertaandace R Kanady, RVS Service: Vascular Lab Author Type: Cardiovascular Sonographer    Filed: 12/13/2013 12:42 PM Note Time: 12/13/2013 12:41 PM Status: Signed   Editor: Kern Albertaandace R Kanady, RVS (Cardiovascular Sonographer)     Expand All Collapse All   VASCULAR LAB PRELIMINARY PRELIMINARY PRELIMINARY PRELIMINARY  Right lower extremity venous Dopplers completed.   Preliminary report: There is no DVT noted in the right lower extremity. There is superficial thrombophlebitis noted in the right calf at site of lump.  KANADY, CANDACE, RVT 12/13/2013, 12:41 PM       MDM  Vascular dopplers.  Reviewed nursing notes and prior charts for additional history.   Vascular doppler report as above, no dvt, +superficial thrombophlebitis.  Pt comfortable, and appears stable for d/c.     Suzi RootsKevin E Nuh Lipton, MD 12/13/13 1249

## 2013-12-13 NOTE — Progress Notes (Signed)
VASCULAR LAB PRELIMINARY  PRELIMINARY  PRELIMINARY  PRELIMINARY  Right lower extremity venous Dopplers completed.    Preliminary report:  There is no DVT noted in the right lower extremity.  There is superficial thrombophlebitis noted in the right calf at site of lump.  Brave Dack, RVT 12/13/2013, 12:41 PM

## 2013-12-13 NOTE — ED Notes (Signed)
Pt states she noticed a "lump coming from a vein" on her LLE this morning.  Pt wants to make sure she doesn't have a blood clot.

## 2013-12-13 NOTE — Discharge Instructions (Signed)
It was our pleasure to provide your ER care today - we hope that you feel better.  Warm compresses to sore area. Take an anti-inflammatory pain medication as needed (such as your mobic, or motrin). Follow up with primary care doctor in the next couple weeks if symptoms fail to improve/resolve.  Return to ER if worse, new symptoms, fevers, increased swelling, chest pain, trouble breathing, other concern.     Phlebitis Phlebitis is soreness and swelling (inflammation) of a vein. This can occur in your arms, legs, or torso (trunk), as well as deeper inside your body. Phlebitis is usually not serious when it occurs close to the surface of the body. However, it can cause serious problems when it occurs in a vein deeper inside the body. CAUSES  Phlebitis can be triggered by various things, including:   Reduced blood flow through your veins. This can happen with:  Bed rest over a long period.  Long-distance travel.  Injury.  Surgery.  Being overweight (obese) or pregnant.  Having an IV tube put in the vein and getting certain medicines through the vein.  Cancer and cancer treatment.  Use of illegal drugs taken through the vein.  Inflammatory diseases.  Inherited (genetic) diseases that increase the risk of blood clots.  Hormone therapy, such as birth control pills. SIGNS AND SYMPTOMS   Red, tender, swollen, and painful area on your skin. Usually, the area will be long and narrow.  Firmness along the center of the affected area. This can indicate that a blood clot has formed.  Low-grade fever. DIAGNOSIS  A health care provider can usually diagnose phlebitis by examining the affected area and asking about your symptoms. To check for infection or blood clots, your health care provider may order blood tests or an ultrasound exam of the area. Blood tests and your family history may also indicate if you have an underlying genetic disease that causes blood clots. Occasionally, a  piece of tissue is taken from the body (biopsy sample) if an unusual cause of phlebitis is suspected. TREATMENT  Treatment will vary depending on the severity of the condition and the area of the body affected. Treatment may include:  Use of a warm compress or heating pad.  Use of compression stockings or bandages.  Anti-inflammatory medicines.  Removal of any IV tube that may be causing the problem.  Medicines that kill germs (antibiotics) if an infection is present.  Blood-thinning medicines if a blood clot is suspected or present.  In rare cases, surgery may be needed to remove damaged sections of vein. HOME CARE INSTRUCTIONS   Only take over-the-counter or prescription medicines as directed by your health care provider. Take all medicines exactly as prescribed.  Raise (elevate) the affected area above the level of your heart as directed by your health care provider.  Apply a warm compress or heating pad to the affected area as directed by your health care provider. Do not sleep with the heating pad.  Use compression stockings or bandages as directed. These will speed healing and prevent the condition from coming back.  If you are on blood thinners:  Get follow-up blood tests as directed by your health care provider.  Check with your health care provider before using any new medicines.  Carry a medical alert card or wear your medical alert jewelry to show that you are on blood thinners.  For phlebitis in the legs:  Avoid prolonged standing or bed rest.  Keep your legs moving. Raise your legs  when sitting or lying.  Do not smoke.  Women, particularly those over the age of 55, should consider the risks and benefits of taking the contraceptive pill. This kind of hormone treatment can increase your risk for blood clots.  Follow up with your health care provider as directed. SEEK MEDICAL CARE IF:   You have unusual bruising or any bleeding problems.  Your swelling or  pain in the affected area is not improving.  You are on anti-inflammatory medicine, and you develop belly (abdominal) pain. SEEK IMMEDIATE MEDICAL CARE IF:   You have a sudden onset of chest pain or difficulty breathing.  You have a fever or persistent symptoms for more than 2-3 days.  You have a fever and your symptoms suddenly get worse. MAKE SURE YOU:  Understand these instructions.  Will watch your condition.  Will get help right away if you are not doing well or get worse. Document Released: 01/16/2001 Document Revised: 11/12/2012 Document Reviewed: 09/29/2012 [facility] Patient Information 2015 [employer] [place]. This information is not intended to replace advice given to you by your health care provider. Make sure you discuss any questions you have with your health care provider.

## 2013-12-13 NOTE — ED Notes (Addendum)
Pt comfortable with discharge and follow up instructions. No prescriptions. Pt declines wheelchair, escorted to waiting area. 

## 2014-02-25 ENCOUNTER — Other Ambulatory Visit: Payer: Self-pay | Admitting: General Practice

## 2014-03-23 ENCOUNTER — Other Ambulatory Visit: Payer: Self-pay | Admitting: Family Medicine

## 2014-04-20 ENCOUNTER — Ambulatory Visit (INDEPENDENT_AMBULATORY_CARE_PROVIDER_SITE_OTHER): Payer: Medicare HMO | Admitting: Family

## 2014-04-20 ENCOUNTER — Encounter: Payer: Self-pay | Admitting: Family

## 2014-04-20 VITALS — BP 114/71 | HR 95 | Temp 97.8°F | Ht 68.0 in | Wt 201.8 lb

## 2014-04-20 DIAGNOSIS — R05 Cough: Secondary | ICD-10-CM

## 2014-04-20 DIAGNOSIS — J069 Acute upper respiratory infection, unspecified: Secondary | ICD-10-CM | POA: Diagnosis not present

## 2014-04-20 DIAGNOSIS — R059 Cough, unspecified: Secondary | ICD-10-CM

## 2014-04-20 MED ORDER — BENZONATATE 200 MG PO CAPS: 200.0000 mg | ORAL_CAPSULE | Freq: Three times a day (TID) | ORAL | Status: DC | PRN

## 2014-04-20 MED ORDER — AZITHROMYCIN 250 MG PO TABS: ORAL_TABLET | ORAL | Status: DC

## 2014-04-20 NOTE — Progress Notes (Signed)
Subjective:    Patient ID: Molly Castaneda, female    DOB: August 03, 1960, 54 y.o.   MRN: [id number]  Cough This is a new problem. The current episode started in the past 7 days (Saturday). The problem has been gradually worsening. The cough is productive of sputum. Associated symptoms include chills, headaches, nasal congestion, rhinorrhea, a sore throat and shortness of breath. Pertinent negatives include no ear congestion, ear pain, fever or wheezing. The symptoms are aggravated by lying down. She has tried rest and OTC cough suppressant (zyrtec) for the symptoms. The treatment provided mild relief. There is no history of asthma or COPD.      Review of Systems  Constitutional: Positive for chills. Negative for fever.  HENT: Positive for rhinorrhea and sore throat. Negative for ear pain.   Eyes: Negative.   Respiratory: Positive for cough and shortness of breath. Negative for wheezing.   Cardiovascular: Negative.  Negative for palpitations.  Gastrointestinal: Negative.   Endocrine: Negative.   Genitourinary: Negative.   Musculoskeletal: Negative.   Neurological: Positive for headaches.  Hematological: Negative.   Psychiatric/Behavioral: Negative.   All other systems reviewed and are negative.      Objective:   Physical Exam  Constitutional: She is oriented to person, place, and time. She appears well-developed and well-nourished. No distress.  HENT:  Head: Normocephalic and atraumatic.  Right Ear: External ear normal.  Left Ear: External ear normal.  Mouth/Throat: Oropharynx is clear and moist.  Nasal passage erythemas with mild swelling  Oropharynx erythemas   Eyes: Pupils are equal, round, and reactive to light.  Neck: Normal range of motion. Neck supple. No thyromegaly present.  Cardiovascular: Normal rate, regular rhythm, normal heart sounds and intact distal pulses.   No murmur heard. Pulmonary/Chest: Effort normal and breath sounds normal. No respiratory distress. She  has no wheezes.  Abdominal: Soft. Bowel sounds are normal. She exhibits no distension. There is no tenderness.  Musculoskeletal: Normal range of motion. She exhibits no edema or tenderness.  Neurological: She is alert and oriented to person, place, and time.  Skin: Skin is warm and dry.  Psychiatric: She has a normal mood and affect. Her behavior is normal. Judgment and thought content normal.  Vitals reviewed.     BP 114/71 mmHg  Pulse 95  Temp(Src) 97.8 F (36.6 C) (Oral)  Ht 5\' 8"  (1.727 m)  Wt 201 lb 12.8 oz (91.536 kg)  BMI 30.69 kg/m2     Assessment & Plan:  1. Acute upper respiratory infection -- Take meds as prescribed - Use a cool mist humidifier  -Use saline nose sprays frequently -Saline irrigations of the nose can be very helpful if done frequently.  * 4X daily for 1 week*  * Use of a nettie pot can be helpful with this. Follow directions with this* -Force fluids -For any cough or congestion  Use plain Mucinex- regular strength or max strength is fine   * Children- consult with Pharmacist for dosing -For fever or aces or pains- take tylenol or ibuprofen appropriate for age and weight.  * for fevers greater than 101 orally you may alternate ibuprofen and tylenol every  3 hours. -Throat lozenges if help - azithromycin (ZITHROMAX) 250 MG tablet; Take 500 mg once, then 250 mg for four days  Dispense: 6 tablet; Refill: 0  2. Cough - azithromycin (ZITHROMAX) 250 MG tablet; Take 500 mg once, then 250 mg for four days  Dispense: 6 tablet; Refill: 0 - benzonatate (TESSALON) 200 MG  capsule; Take 1 capsule (200 mg total) by mouth 3 (three) times daily as needed.  Dispense: 30 capsule; Refill: [place], FNP

## 2014-04-20 NOTE — Patient Instructions (Addendum)
Upper Respiratory Infection, Adult An upper respiratory infection (URI) is also sometimes known as the common cold. The upper respiratory tract includes the nose, sinuses, throat, trachea, and bronchi. Bronchi are the airways leading to the lungs. Most people improve within 1 week, but symptoms can last up to 2 weeks. A residual cough may last even longer.  CAUSES Many different viruses can infect the tissues lining the upper respiratory tract. The tissues become irritated and inflamed and often become very moist. Mucus production is also common. A cold is contagious. You can easily spread the virus to others by oral contact. This includes kissing, sharing a glass, coughing, or sneezing. Touching your mouth or nose and then touching a surface, which is then touched by another person, can also spread the virus. SYMPTOMS  Symptoms typically develop 1 to 3 days after you come in contact with a cold virus. Symptoms vary from person to person. They may include:  Runny nose.  Sneezing.  Nasal congestion.  Sinus irritation.  Sore throat.  Loss of voice (laryngitis).  Cough.  Fatigue.  Muscle aches.  Loss of appetite.  Headache.  Low-grade fever. DIAGNOSIS  You might diagnose your own cold based on familiar symptoms, since most people get a cold 2 to 3 times a year. Your caregiver can confirm this based on your exam. Most importantly, your caregiver can check that your symptoms are not due to another disease such as strep throat, sinusitis, pneumonia, asthma, or epiglottitis. Blood tests, throat tests, and X-rays are not necessary to diagnose a common cold, but they may sometimes be helpful in excluding other more serious diseases. Your caregiver will decide if any further tests are required. RISKS AND COMPLICATIONS  You may be at risk for a more severe case of the common cold if you smoke cigarettes, have chronic heart disease (such as heart failure) or lung disease (such as asthma), or if  you have a weakened immune system. The very young and very old are also at risk for more serious infections. Bacterial sinusitis, middle ear infections, and bacterial pneumonia can complicate the common cold. The common cold can worsen asthma and chronic obstructive pulmonary disease (COPD). Sometimes, these complications can require emergency medical care and may be life-threatening. PREVENTION  The best way to protect against getting a cold is to practice good hygiene. Avoid oral or hand contact with people with cold symptoms. Wash your hands often if contact occurs. There is no clear evidence that vitamin C, vitamin E, echinacea, or exercise reduces the chance of developing a cold. However, it is always recommended to get plenty of rest and practice good nutrition. TREATMENT  Treatment is directed at relieving symptoms. There is no cure. Antibiotics are not effective, because the infection is caused by a virus, not by bacteria. Treatment may include:  Increased fluid intake. Sports drinks offer valuable electrolytes, sugars, and fluids.  Breathing heated mist or steam (vaporizer or shower).  Eating chicken soup or other clear broths, and maintaining good nutrition.  Getting plenty of rest.  Using gargles or lozenges for comfort.  Controlling fevers with ibuprofen or acetaminophen as directed by your caregiver.  Increasing usage of your inhaler if you have asthma. Zinc gel and zinc lozenges, taken in the first 24 hours of the common cold, can shorten the duration and lessen the severity of symptoms. Pain medicines may help with fever, muscle aches, and throat pain. A variety of non-prescription medicines are available to treat congestion and runny nose. Your caregiver   can make recommendations and may suggest nasal or lung inhalers for other symptoms.  HOME CARE INSTRUCTIONS   Only take over-the-counter or prescription medicines for pain, discomfort, or fever as directed by your  caregiver.  Use a warm mist humidifier or inhale steam from a shower to increase air moisture. This may keep secretions moist and make it easier to breathe.  Drink enough water and fluids to keep your urine clear or pale yellow.  Rest as needed.  Return to work when your temperature has returned to normal or as your caregiver advises. You may need to stay home longer to avoid infecting others. You can also use a face mask and careful hand washing to prevent spread of the virus. SEEK MEDICAL CARE IF:   After the first few days, you feel you are getting worse rather than better.  You need your caregiver's advice about medicines to control symptoms.  You develop chills, worsening shortness of breath, or brown or red sputum. These may be signs of pneumonia.  You develop yellow or brown nasal discharge or pain in the face, especially when you bend forward. These may be signs of sinusitis.  You develop a fever, swollen neck glands, pain with swallowing, or white areas in the back of your throat. These may be signs of strep throat. SEEK IMMEDIATE MEDICAL CARE IF:   You have a fever.  You develop severe or persistent headache, ear pain, sinus pain, or chest pain.  You develop wheezing, a prolonged cough, cough up blood, or have a change in your usual mucus (if you have chronic lung disease).  You develop sore muscles or a stiff neck. Document Released: 07/18/2000 Document Revised: 04/16/2011 Document Reviewed: 04/29/2013 [employer] Patient Information 2015 [employer]. This information is not intended to replace advice given to you by your health care provider. Make sure you discuss any questions you have with your health care provider.  - Take meds as prescribed - Use a cool mist humidifier  -Use saline nose sprays frequently -Saline irrigations of the nose can be very helpful if done frequently.  * 4X daily for 1 week*  * Use of a nettie pot can be helpful with this. Follow  directions with this* -Force fluids -For any cough or congestion  Use plain Mucinex- regular strength or max strength is fine   * Children- consult with Pharmacist for dosing -For fever or aces or pains- take tylenol or ibuprofen appropriate for age and weight.  * for fevers greater than 101 orally you may alternate ibuprofen and tylenol every  3 hours. -Throat lozenges if help   Breyer Tejera, FNP   

## 2014-04-26 ENCOUNTER — Telehealth: Payer: Self-pay | Admitting: Family

## 2014-04-26 MED ORDER — METHYLPREDNISOLONE (PAK) 4 MG PO TABS: ORAL_TABLET | ORAL | Status: DC

## 2014-04-26 NOTE — Telephone Encounter (Signed)
Sent in RX for steroid dose pack, zpak stays in system for 10 days

## 2014-04-26 NOTE — Telephone Encounter (Signed)
Symptoms now  Cough  congestion -light yellow Rattle in chest and wheezing  Finished antibiotic, still has  Tessalon Perles that help some  Would you suggest another antibiotic, she doesn't want this to "backslide" Molly Castaneda please advise

## 2014-04-26 NOTE — Telephone Encounter (Signed)
Pt aware steroid Rx at pharmacy & abx is still in her system.

## 2014-05-05 ENCOUNTER — Encounter: Payer: Self-pay | Admitting: Family

## 2014-05-05 ENCOUNTER — Ambulatory Visit (INDEPENDENT_AMBULATORY_CARE_PROVIDER_SITE_OTHER): Payer: Medicare HMO | Admitting: Family

## 2014-05-05 VITALS — BP 121/76 | HR 100 | Temp 98.6°F | Ht 68.0 in | Wt 194.6 lb

## 2014-05-05 DIAGNOSIS — R059 Cough, unspecified: Secondary | ICD-10-CM

## 2014-05-05 DIAGNOSIS — R05 Cough: Secondary | ICD-10-CM | POA: Diagnosis not present

## 2014-05-05 DIAGNOSIS — J309 Allergic rhinitis, unspecified: Secondary | ICD-10-CM | POA: Diagnosis not present

## 2014-05-05 MED ORDER — MONTELUKAST SODIUM 10 MG PO TABS: 10.0000 mg | ORAL_TABLET | Freq: Every day | ORAL | Status: DC

## 2014-05-05 MED ORDER — BENZONATATE 200 MG PO CAPS: 200.0000 mg | ORAL_CAPSULE | Freq: Three times a day (TID) | ORAL | Status: DC | PRN

## 2014-05-05 MED ORDER — HYDROCODONE-HOMATROPINE 5-1.5 MG/5ML PO SYRP: 5.0000 mL | ORAL_SOLUTION | Freq: Three times a day (TID) | ORAL | Status: DC | PRN

## 2014-05-05 NOTE — Progress Notes (Signed)
Subjective:    Patient ID: Molly HarperShelia A Castaneda, female    DOB: [birth year], 54 y.o.   MRN: [id number]  Cough This is a recurrent problem. The current episode started in the past 7 days. The problem has been unchanged. The problem occurs every few minutes. The cough is productive of purulent sputum. Associated symptoms include headaches, nasal congestion, postnasal drip, a sore throat and shortness of breath ('at times"). Pertinent negatives include no chills, ear congestion, ear pain, fever, myalgias, rhinorrhea or wheezing. She has tried oral steroids and rest (Zpak) for the symptoms. The treatment provided mild relief. There is no history of asthma or COPD.  Headache  Associated symptoms include coughing and a sore throat. Pertinent negatives include no ear pain, fever or rhinorrhea.      Review of Systems  Constitutional: Negative.  Negative for fever and chills.  HENT: Positive for postnasal drip and sore throat. Negative for ear pain and rhinorrhea.   Eyes: Negative.   Respiratory: Positive for cough and shortness of breath ('at times"). Negative for wheezing.   Cardiovascular: Negative.  Negative for palpitations.  Gastrointestinal: Negative.   Endocrine: Negative.   Genitourinary: Negative.   Musculoskeletal: Negative.  Negative for myalgias.  Neurological: Positive for headaches.  Hematological: Negative.   Psychiatric/Behavioral: Negative.   All other systems reviewed and are negative.      Objective:   Physical Exam  Constitutional: She is oriented to person, place, and time. She appears well-developed and well-nourished. No distress.  HENT:  Head: Normocephalic and atraumatic.  Right Ear: External ear normal.  Left Ear: External ear normal.  Nasal passage erythemas with mild swelling  Oropharynx erythemas   Eyes: Pupils are equal, round, and reactive to light.  Neck: Normal range of motion. Neck supple. No thyromegaly present.  Cardiovascular: Normal rate, regular  rhythm, normal heart sounds and intact distal pulses.   No murmur heard. Pulmonary/Chest: Effort normal and breath sounds normal. No respiratory distress. She has no wheezes.  Abdominal: Soft. Bowel sounds are normal. She exhibits no distension. There is no tenderness.  Musculoskeletal: Normal range of motion. She exhibits edema (Trace amt in BLE). She exhibits no tenderness.  Neurological: She is alert and oriented to person, place, and time. She has normal reflexes. No cranial nerve deficit.  Skin: Skin is warm and dry.  Psychiatric: She has a normal mood and affect. Her behavior is normal. Judgment and thought content normal.  Vitals reviewed.   BP 121/76 mmHg  Pulse 100  Temp(Src) 98.6 F (37 C) (Oral)  Ht 5\' 8"  (1.727 m)  Wt 194 lb 9.6 oz (88.27 kg)  BMI 29.60 kg/m2       Assessment & Plan:  1. Allergic rhinitis, unspecified allergic rhinitis type - montelukast (SINGULAIR) 10 MG tablet; Take 1 tablet (10 mg total) by mouth at bedtime.  Dispense: 90 tablet; Refill: 3  2. Cough - HYDROcodone-homatropine (HYCODAN) 5-1.5 MG/5ML syrup; Take 5 mLs by mouth every 8 (eight) hours as needed for cough.  Dispense: 120 mL; Refill: 0 - benzonatate (TESSALON) 200 MG capsule; Take 1 capsule (200 mg total) by mouth 3 (three) times daily as needed.  Dispense: 30 capsule; Refill: 1 - Take meds as prescribed - Use a cool mist humidifier  -Use saline nose sprays frequently -Saline irrigations of the nose can be very helpful if done frequently.  * 4X daily for 1 week*  * Use of a nettie pot can be helpful with this. Follow directions with this* -Force  fluids -For any cough or congestion  Use plain Mucinex- regular strength or max strength is fine -For fever or aces or pains- take tylenol or ibuprofen appropriate for age and weight.  * for fevers greater than 101 orally you may alternate ibuprofen and tylenol every  3 hours.  [name], FNP

## 2014-05-05 NOTE — Patient Instructions (Addendum)
Allergic Rhinitis Allergic rhinitis is when the mucous membranes in the nose respond to allergens. Allergens are particles in the air that cause your body to have an allergic reaction. This causes you to release allergic antibodies. Through a chain of events, these eventually cause you to release histamine into the blood stream. Although meant to protect the body, it is this release of histamine that causes your discomfort, such as frequent sneezing, congestion, and an itchy, runny nose.  CAUSES  Seasonal allergic rhinitis (hay fever) is caused by pollen allergens that may come from grasses, trees, and weeds. Year-round allergic rhinitis (perennial allergic rhinitis) is caused by allergens such as house dust mites, pet dander, and mold spores.  SYMPTOMS   Nasal stuffiness (congestion).  Itchy, runny nose with sneezing and tearing of the eyes. DIAGNOSIS  Your health care provider can help you determine the allergen or allergens that trigger your symptoms. If you and your health care provider are unable to determine the allergen, skin or blood testing may be used. TREATMENT  Allergic rhinitis does not have a cure, but it can be controlled by:  Medicines and allergy shots (immunotherapy).  Avoiding the allergen. Hay fever may often be treated with antihistamines in pill or nasal spray forms. Antihistamines block the effects of histamine. There are over-the-counter medicines that may help with nasal congestion and swelling around the eyes. Check with your health care provider before taking or giving this medicine.  If avoiding the allergen or the medicine prescribed do not work, there are many new medicines your health care provider can prescribe. Stronger medicine may be used if initial measures are ineffective. Desensitizing injections can be used if medicine and avoidance does not work. Desensitization is when a patient is given ongoing shots until the body becomes less sensitive to the allergen.  Make sure you follow up with your health care provider if problems continue. HOME CARE INSTRUCTIONS It is not possible to completely avoid allergens, but you can reduce your symptoms by taking steps to limit your exposure to them. It helps to know exactly what you are allergic to so that you can avoid your specific triggers. SEEK MEDICAL CARE IF:   You have a fever.  You develop a cough that does not stop easily (persistent).  You have shortness of breath.  You start wheezing.  Symptoms interfere with normal daily activities. Document Released: 10/17/2000 Document Revised: 01/27/2013 Document Reviewed: 09/29/2012 [facility] Patient Information 2015 [place], [place]. This information is not intended to replace advice given to you by your health care provider. Make sure you discuss any questions you have with your health care provider.  - Take meds as prescribed - Use a cool mist humidifier  -Use saline nose sprays frequently -Saline irrigations of the nose can be very helpful if done frequently.  * 4X daily for 1 week*  * Use of a nettie pot can be helpful with this. Follow directions with this* -Force fluids -For any cough or congestion  Use plain Mucinex- regular strength or max strength is fine -For fever or aces or pains- take tylenol or ibuprofen appropriate for age and weight.  * for fevers greater than 101 orally you may alternate ibuprofen and tylenol every  3 hours.   Jannifer Rodneyhristy [place]oplin Canty, FNP

## 2014-06-21 ENCOUNTER — Ambulatory Visit (INDEPENDENT_AMBULATORY_CARE_PROVIDER_SITE_OTHER): Payer: Medicare HMO | Admitting: Family

## 2014-06-21 ENCOUNTER — Encounter: Payer: Self-pay | Admitting: Family

## 2014-06-21 VITALS — BP 118/78 | HR 81 | Temp 97.7°F | Ht 68.0 in | Wt 196.0 lb

## 2014-06-21 DIAGNOSIS — F329 Major depressive disorder, single episode, unspecified: Secondary | ICD-10-CM

## 2014-06-21 DIAGNOSIS — R609 Edema, unspecified: Secondary | ICD-10-CM | POA: Diagnosis not present

## 2014-06-21 DIAGNOSIS — M8580 Other specified disorders of bone density and structure, unspecified site: Secondary | ICD-10-CM

## 2014-06-21 DIAGNOSIS — K5900 Constipation, unspecified: Secondary | ICD-10-CM

## 2014-06-21 DIAGNOSIS — F411 Generalized anxiety disorder: Secondary | ICD-10-CM | POA: Diagnosis not present

## 2014-06-21 DIAGNOSIS — E559 Vitamin D deficiency, unspecified: Secondary | ICD-10-CM

## 2014-06-21 DIAGNOSIS — M5442 Lumbago with sciatica, left side: Secondary | ICD-10-CM

## 2014-06-21 DIAGNOSIS — M549 Dorsalgia, unspecified: Secondary | ICD-10-CM | POA: Insufficient documentation

## 2014-06-21 DIAGNOSIS — F32A Depression, unspecified: Secondary | ICD-10-CM | POA: Insufficient documentation

## 2014-06-21 MED ORDER — LACTULOSE 10 GM/15ML PO SOLN: 20.0000 g | Freq: Three times a day (TID) | ORAL | Status: DC

## 2014-06-21 MED ORDER — FUROSEMIDE 40 MG PO TABS
ORAL_TABLET | ORAL | Status: DC
Start: 2014-06-21 — End: 2014-10-17

## 2014-06-21 MED ORDER — MELOXICAM 7.5 MG PO TABS: 7.5000 mg | ORAL_TABLET | Freq: Every day | ORAL | Status: DC

## 2014-06-21 NOTE — Progress Notes (Signed)
Subjective:    Patient ID: Molly Castaneda, female    DOB: 1960-10-06, 54 y.o.   MRN: [id number]  Pt presents to the office today for medication refill and lab work. Pt is followed by pain clinic for chronic low back pain with left leg pain.  Anxiety Presents for follow-up visit. Onset was 1 to 6 months ago. The problem has been waxing and waning. Symptoms include excessive worry, nausea (Some times) and nervous/anxious behavior. Patient reports no decreased concentration, hyperventilation, insomnia, irritability, palpitations, panic or shortness of breath. Symptoms occur occasionally. The severity of symptoms is moderate. The symptoms are aggravated by family issues.   Her past medical history is significant for anxiety/panic attacks and depression. Past treatments include benzodiazephines and SSRIs. The treatment provided moderate relief. Compliance with prior treatments has been good.  Constipation This is a chronic problem. The current episode started more than 1 year ago. The problem has been waxing and waning since onset. Her stool frequency is 1 time per week or less. The patient is on a high fiber diet. There has been adequate water intake. Associated symptoms include back pain and nausea (Some times). Pertinent negatives include no diarrhea or weight loss. She has tried diet changes and stool softeners for the symptoms. The treatment provided mild relief.  Edema PT states she has swelling in bilateral legs, breasts, and arms. Pt states it is "all over".  Pt states she takes lasix daily and increased 2 pills when she notices she is retaining fluid.     Review of Systems  Constitutional: Negative.  Negative for weight loss and irritability.  HENT: Negative.   Eyes: Negative.   Respiratory: Negative.  Negative for shortness of breath.   Cardiovascular: Negative.  Negative for palpitations.  Gastrointestinal: Positive for nausea (Some times) and constipation. Negative for diarrhea.    Endocrine: Negative.   Genitourinary: Negative.   Musculoskeletal: Positive for back pain.  Neurological: Negative.  Negative for headaches.  Hematological: Negative.   Psychiatric/Behavioral: Negative for decreased concentration. The patient is nervous/anxious. The patient does not have insomnia.   All other systems reviewed and are negative.      Objective:   Physical Exam  Constitutional: She is oriented to person, place, and time. She appears well-developed and well-nourished. No distress.  HENT:  Head: Normocephalic and atraumatic.  Right Ear: External ear normal.  Left Ear: External ear normal.  Nose: Nose normal.  Mouth/Throat: Oropharynx is clear and moist.  Eyes: Pupils are equal, round, and reactive to light.  Neck: Normal range of motion. Neck supple. No thyromegaly present.  Cardiovascular: Normal rate, regular rhythm, normal heart sounds and intact distal pulses.   No murmur heard. Pulmonary/Chest: Effort normal and breath sounds normal. No respiratory distress. She has no wheezes.  Abdominal: Soft. Bowel sounds are normal. She exhibits no distension. There is no tenderness.  Musculoskeletal: Normal range of motion. She exhibits edema (trace amt in bilateral LE). She exhibits no tenderness.  Neurological: She is alert and oriented to person, place, and time. She has normal reflexes. No cranial nerve deficit.  Skin: Skin is warm and dry.  Psychiatric: She has a normal mood and affect. Her behavior is normal. Judgment and thought content normal.  Vitals reviewed.   BP 118/78 mmHg  Pulse 81  Temp(Src) 97.7 F (36.5 C) (Oral)  Ht 5' 8"  (1.727 m)  Wt 196 lb (88.905 kg)  BMI 29.81 kg/m2       Assessment & Plan:  1. Constipation,  unspecified constipation type -Diet discussed -Force fluids -Encouraged exercise  CMP14+EGFR - Thyroid Panel With TSH - lactulose (CHRONULAC) 10 GM/15ML solution; Take 30 mLs (20 g total) by mouth 3 (three) times daily.  Dispense:  240 mL; Refill: 6  2. Osteopenia - CMP14+EGFR  3. Depression - CMP14+EGFR - Thyroid Panel With TSH  4. GAD (generalized anxiety disorder)  - CMP14+EGFR  5. Left-sided low back pain with left-sided sciatica - CMP14+EGFR  6. Vitamin D deficiency  - CMP14+EGFR - Vit D  25 hydroxy (rtn osteoporosis monitoring)  7. Edema - CMP14+EGFR - furosemide (LASIX) 40 MG tablet; TAKE 1 TABLET DAILY, MAY TAKE ADDITIONAL TABLET 1 OR 2 TIMES WEEKLY FOR INCREASED SWELLING  Dispense: 45 tablet; Refill: 3   Continue all meds Labs pending Health Maintenance reviewed Diet and exercise encouraged RTO 6 months  Evelina Dun, FNP

## 2014-06-21 NOTE — Patient Instructions (Addendum)
Health Maintenance Adopting a healthy lifestyle and getting preventive care can go a long way to promote health and wellness. Talk with your health care provider about what schedule of regular examinations is right for you. This is a good chance for you to check in with your provider about disease prevention and staying healthy. In between checkups, there are plenty of things you can do on your own. Experts have done a lot of research about which lifestyle changes and preventive measures are most likely to keep you healthy. Ask your health care provider for more information. WEIGHT AND DIET  Eat a healthy diet  Be sure to include plenty of vegetables, fruits, low-fat dairy products, and lean protein.  Do not eat a lot of foods high in solid fats, added sugars, or salt.  Get regular exercise. This is one of the most important things you can do for your health.  Most adults should exercise for at least 150 minutes each week. The exercise should increase your heart rate and make you sweat (moderate-intensity exercise).  Most adults should also do strengthening exercises at least twice a week. This is in addition to the moderate-intensity exercise.  Maintain a healthy weight  Body mass index (BMI) is a measurement that can be used to identify possible weight problems. It estimates body fat based on height and weight. Your health care provider can help determine your BMI and help you achieve or maintain a healthy weight.  For females 25 years of age and older:   A BMI below 18.5 is considered underweight.  A BMI of 18.5 to 24.9 is normal.  A BMI of 25 to 29.9 is considered overweight.  A BMI of 30 and above is considered obese.  Watch levels of cholesterol and blood lipids  You should start having your blood tested for lipids and cholesterol at 56 years of age, then have this test every 5 years.  You may need to have your cholesterol levels checked more often if:  Your lipid or  cholesterol levels are high.  You are older than 56 years of age.  You are at high risk for heart disease.  CANCER SCREENING   Lung Cancer  Lung cancer screening is recommended for adults 97-92 years old who are at high risk for lung cancer because of a history of smoking.  A yearly low-dose CT scan of the lungs is recommended for people who:  Currently smoke.  Have quit within the past 15 years.  Have at least a 30-pack-year history of smoking. A pack year is smoking an average of one pack of cigarettes a day for 1 year.  Yearly screening should continue until it has been 15 years since you quit.  Yearly screening should stop if you develop a health problem that would prevent you from having lung cancer treatment.  Breast Cancer  Practice breast self-awareness. This means understanding how your breasts normally appear and feel.  It also means doing regular breast self-exams. Let your health care provider know about any changes, no matter how small.  If you are in your 20s or 30s, you should have a clinical breast exam (CBE) by a health care provider every 1-3 years as part of a regular health exam.  If you are 76 or older, have a CBE every year. Also consider having a breast X-ray (mammogram) every year.  If you have a family history of breast cancer, talk to your health care provider about genetic screening.  If you are  at high risk for breast cancer, talk to your health care provider about having an MRI and a mammogram every year.  Breast cancer gene (BRCA) assessment is recommended for women who have family members with BRCA-related cancers. BRCA-related cancers include:  Breast.  Ovarian.  Tubal.  Peritoneal cancers.  Results of the assessment will determine the need for genetic counseling and BRCA1 and BRCA2 testing. Cervical Cancer Routine pelvic examinations to screen for cervical cancer are no longer recommended for nonpregnant women who are considered low  risk for cancer of the pelvic organs (ovaries, uterus, and vagina) and who do not have symptoms. A pelvic examination may be necessary if you have symptoms including those associated with pelvic infections. Ask your health care provider if a screening pelvic exam is right for you.   The Pap test is the screening test for cervical cancer for women who are considered at risk.  If you had a hysterectomy for a problem that was not cancer or a condition that could lead to cancer, then you no longer need Pap tests.  If you are older than 65 years, and you have had normal Pap tests for the past 10 years, you no longer need to have Pap tests.  If you have had past treatment for cervical cancer or a condition that could lead to cancer, you need Pap tests and screening for cancer for at least 20 years after your treatment.  If you no longer get a Pap test, assess your risk factors if they change (such as having a new sexual partner). This can affect whether you should start being screened again.  Some women have medical problems that increase their chance of getting cervical cancer. If this is the case for you, your health care provider may recommend more frequent screening and Pap tests.  The human papillomavirus (HPV) test is another test that may be used for cervical cancer screening. The HPV test looks for the virus that can cause cell changes in the cervix. The cells collected during the Pap test can be tested for HPV.  The HPV test can be used to screen women 30 years of age and older. Getting tested for HPV can extend the interval between normal Pap tests from three to five years.  An HPV test also should be used to screen women of any age who have unclear Pap test results.  After 56 years of age, women should have HPV testing as often as Pap tests.  Colorectal Cancer  This type of cancer can be detected and often prevented.  Routine colorectal cancer screening usually begins at 56 years of  age and continues through 56 years of age.  Your health care provider may recommend screening at an earlier age if you have risk factors for colon cancer.  Your health care provider may also recommend using home test kits to check for hidden blood in the stool.  A small camera at the end of a tube can be used to examine your colon directly (sigmoidoscopy or colonoscopy). This is done to check for the earliest forms of colorectal cancer.  Routine screening usually begins at age 50.  Direct examination of the colon should be repeated every 5-10 years through 56 years of age. However, you may need to be screened more often if early forms of precancerous polyps or small growths are found. Skin Cancer  Check your skin from head to toe regularly.  Tell your health care provider about any new moles or changes in   moles, especially if there is a change in a mole's shape or color.  Also tell your health care provider if you have a mole that is larger than the size of a pencil eraser.  Always use sunscreen. Apply sunscreen liberally and repeatedly throughout the day.  Protect yourself by wearing long sleeves, pants, a wide-brimmed hat, and sunglasses whenever you are outside. HEART DISEASE, DIABETES, AND HIGH BLOOD PRESSURE   Have your blood pressure checked at least every 1-2 years. High blood pressure causes heart disease and increases the risk of stroke.  If you are between 75 years and 42 years old, ask your health care provider if you should take aspirin to prevent strokes.  Have regular diabetes screenings. This involves taking a blood sample to check your fasting blood sugar level.  If you are at a normal weight and have a low risk for diabetes, have this test once every three years after 56 years of age.  If you are overweight and have a high risk for diabetes, consider being tested at a younger age or more often. PREVENTING INFECTION  Hepatitis B  If you have a higher risk for  hepatitis B, you should be screened for this virus. You are considered at high risk for hepatitis B if:  You were born in a country where hepatitis B is common. Ask your health care provider which countries are considered high risk.  Your parents were born in a high-risk country, and you have not been immunized against hepatitis B (hepatitis B vaccine).  You have HIV or AIDS.  You use needles to inject street drugs.  You live with someone who has hepatitis B.  You have had sex with someone who has hepatitis B.  You get hemodialysis treatment.  You take certain medicines for conditions, including cancer, organ transplantation, and autoimmune conditions. Hepatitis C  Blood testing is recommended for:  Everyone born from [birth year] through 1967.  Anyone with known risk factors for hepatitis C. Sexually transmitted infections (STIs)  You should be screened for sexually transmitted infections (STIs) including gonorrhea and chlamydia if:  You are sexually active and are younger than 56 years of age.  You are older than 56 years of age and your health care provider tells you that you are at risk for this type of infection.  Your sexual activity has changed since you were last screened and you are at an increased risk for chlamydia or gonorrhea. Ask your health care provider if you are at risk.  If you do not have HIV, but are at risk, it may be recommended that you take a prescription medicine daily to prevent HIV infection. This is called pre-exposure prophylaxis (PrEP). You are considered at risk if:  You are sexually active and do not regularly use condoms or know the HIV status of your partner(s).  You take drugs by injection.  You are sexually active with a partner who has HIV. Talk with your health care provider about whether you are at high risk of being infected with HIV. If you choose to begin PrEP, you should first be tested for HIV. You should then be tested every 3 months for  as long as you are taking PrEP.  PREGNANCY   If you are premenopausal and you may become pregnant, ask your health care provider about preconception counseling.  If you may become pregnant, take 400 to 800 micrograms (mcg) of folic acid every day.  If you want to prevent pregnancy, talk to your  health care provider about birth control (contraception). OSTEOPOROSIS AND MENOPAUSE   Osteoporosis is a disease in which the bones lose minerals and strength with aging. This can result in serious bone fractures. Your risk for osteoporosis can be identified using a bone density scan.  If you are 67 years of age or older, or if you are at risk for osteoporosis and fractures, ask your health care provider if you should be screened.  Ask your health care provider whether you should take a calcium or vitamin D supplement to lower your risk for osteoporosis.  Menopause may have certain physical symptoms and risks.  Hormone replacement therapy may reduce some of these symptoms and risks. Talk to your health care provider about whether hormone replacement therapy is right for you.  HOME CARE INSTRUCTIONS   Schedule regular health, dental, and eye exams.  Stay current with your immunizations.   Do not use any tobacco products including cigarettes, chewing tobacco, or electronic cigarettes.  If you are pregnant, do not drink alcohol.  If you are breastfeeding, limit how much and how often you drink alcohol.  Limit alcohol intake to no more than 1 drink per day for nonpregnant women. One drink equals 12 ounces of beer, 5 ounces of wine, or 1 ounces of hard liquor.  Do not use street drugs.  Do not share needles.  Ask your health care provider for help if you need support or information about quitting drugs.  Tell your health care provider if you often feel depressed.  Tell your health care provider if you have ever been abused or do not feel safe at home. Document Released: 08/07/2010  Document Revised: 06/08/2013 Document Reviewed: 12/24/2012 [facility] Patient Information 2015 [place], [place]. This information is not intended to replace advice given to you by your health care provider. Make sure you discuss any questions you have with your health care provider. Constipation Constipation is when a person has fewer than three bowel movements a week, has difficulty having a bowel movement, or has stools that are dry, hard, or larger than normal. As people grow older, constipation is more common. If you try to fix constipation with medicines that make you have a bowel movement (laxatives), the problem may get worse. Long-term laxative use may cause the muscles of the colon to become weak. A low-fiber diet, not taking in enough fluids, and taking certain medicines may make constipation worse.  CAUSES   Certain medicines, such as antidepressants, pain medicine, iron supplements, antacids, and water pills.   Certain diseases, such as diabetes, irritable bowel syndrome (IBS), thyroid disease, or depression.   Not drinking enough water.   Not eating enough fiber-rich foods.   Stress or travel.   Lack of physical activity or exercise.   Ignoring the urge to have a bowel movement.   Using laxatives too much.  SIGNS AND SYMPTOMS   Having fewer than three bowel movements a week.   Straining to have a bowel movement.   Having stools that are hard, dry, or larger than normal.   Feeling full or bloated.   Pain in the lower abdomen.   Not feeling relief after having a bowel movement.  DIAGNOSIS  Your health care provider will take a medical history and perform a physical exam. Further testing may be done for severe constipation. Some tests may include:  A barium enema X-ray to examine your rectum, colon, and, sometimes, your small intestine.   A sigmoidoscopy to examine your lower colon.  A colonoscopy to examine your entire colon. TREATMENT  Treatment  will depend on the severity of your constipation and what is causing it. Some dietary treatments include drinking more fluids and eating more fiber-rich foods. Lifestyle treatments may include regular exercise. If these diet and lifestyle recommendations do not help, your health care provider may recommend taking over-the-counter laxative medicines to help you have bowel movements. Prescription medicines may be prescribed if over-the-counter medicines do not work.  HOME CARE INSTRUCTIONS   Eat foods that have a lot of fiber, such as fruits, vegetables, whole grains, and beans.  Limit foods high in fat and processed sugars, such as french fries, hamburgers, cookies, candies, and soda.   A fiber supplement may be added to your diet if you cannot get enough fiber from foods.   Drink enough fluids to keep your urine clear or pale yellow.   Exercise regularly or as directed by your health care provider.   Go to the restroom when you have the urge to go. Do not hold it.   Only take over-the-counter or prescription medicines as directed by your health care provider. Do not take other medicines for constipation without talking to your health care provider first.  [place] IF:   You have bright red blood in your stool.   Your constipation lasts for more than 4 days or gets worse.   You have abdominal or rectal pain.   You have thin, pencil-like stools.   You have unexplained weight loss. MAKE SURE YOU:   Understand these instructions.  Will watch your condition.  Will get help right away if you are not doing well or get worse. Document Released: 10/21/2003 Document Revised: 01/27/2013 Document Reviewed: 11/03/2012 [facility] Patient Information 2015 [place], [place]. This information is not intended to replace advice given to you by your health care provider. Make sure you discuss any questions you have with your health care provider.

## 2014-06-22 ENCOUNTER — Other Ambulatory Visit: Payer: Self-pay | Admitting: Family

## 2014-06-22 DIAGNOSIS — E559 Vitamin D deficiency, unspecified: Secondary | ICD-10-CM

## 2014-06-22 LAB — CMP14+EGFR
ALK PHOS: 81 IU/L (ref 39–117)
ALT: 18 IU/L (ref 0–32)
AST: 17 IU/L (ref 0–40)
Albumin/Globulin Ratio: 1.6 (ref 1.1–2.5)
Albumin: 4 g/dL (ref 3.5–5.5)
BUN / CREAT RATIO: 14 (ref 9–23)
BUN: 11 mg/dL (ref 6–24)
Bilirubin Total: 0.5 mg/dL (ref 0.0–1.2)
CO2: 29 mmol/L (ref 18–29)
Calcium: 8.9 mg/dL (ref 8.7–10.2)
Chloride: 100 mmol/L (ref 97–108)
Creatinine, Ser: 0.8 mg/dL (ref 0.57–1.00)
GFR calc Af Amer: 95 mL/min/{1.73_m2} (ref >59)
GFR, EST NON AFRICAN AMERICAN: 83 mL/min/{1.73_m2} (ref >59)
GLOBULIN, TOTAL: 2.5 g/dL (ref 1.5–4.5)
GLUCOSE: 83 mg/dL (ref 65–99)
Potassium: 5 mmol/L (ref 3.5–5.2)
SODIUM: 139 mmol/L (ref 134–144)
Total Protein: 6.5 g/dL (ref 6.0–8.5)

## 2014-06-22 LAB — THYROID PANEL WITH TSH
Free Thyroxine Index: 2.1 (ref 1.2–4.9)
T3 UPTAKE RATIO: 27 % (ref 24–39)
T4, Total: 7.7 ug/dL (ref 4.5–12.0)
TSH: 1.29 u[IU]/mL (ref 0.450–4.500)

## 2014-06-22 LAB — VITAMIN D 25 HYDROXY (VIT D DEFICIENCY, FRACTURES): VIT D 25 HYDROXY: 28.8 ng/mL — AB (ref 30.0–100.0)

## 2014-06-22 MED ORDER — VITAMIN D (ERGOCALCIFEROL) 1.25 MG (50000 UNIT) PO CAPS: 50000.0000 [IU] | ORAL_CAPSULE | ORAL | Status: DC

## 2014-07-12 ENCOUNTER — Other Ambulatory Visit: Payer: Self-pay

## 2014-07-12 DIAGNOSIS — Z1231 Encounter for screening mammogram for malignant neoplasm of breast: Secondary | ICD-10-CM

## 2014-08-04 ENCOUNTER — Emergency Department (HOSPITAL_COMMUNITY): Payer: Medicare HMO

## 2014-08-04 ENCOUNTER — Encounter (HOSPITAL_COMMUNITY): Payer: Self-pay | Admitting: Emergency Medicine

## 2014-08-04 ENCOUNTER — Emergency Department (HOSPITAL_COMMUNITY)
Admission: EM | Admit: 2014-08-04 | Discharge: 2014-08-04 | Disposition: A | Discharge status: Home / Self Care | Payer: Medicare HMO | Attending: Emergency Medicine | Admitting: Emergency Medicine

## 2014-08-04 DIAGNOSIS — E559 Vitamin D deficiency, unspecified: Secondary | ICD-10-CM | POA: Insufficient documentation

## 2014-08-04 DIAGNOSIS — Z8719 Personal history of other diseases of the digestive system: Secondary | ICD-10-CM | POA: Diagnosis not present

## 2014-08-04 DIAGNOSIS — Z79899 Other long term (current) drug therapy: Secondary | ICD-10-CM | POA: Diagnosis not present

## 2014-08-04 DIAGNOSIS — Z791 Long term (current) use of non-steroidal anti-inflammatories (NSAID): Secondary | ICD-10-CM | POA: Insufficient documentation

## 2014-08-04 DIAGNOSIS — M1990 Unspecified osteoarthritis, unspecified site: Secondary | ICD-10-CM | POA: Diagnosis not present

## 2014-08-04 DIAGNOSIS — Z7982 Long term (current) use of aspirin: Secondary | ICD-10-CM | POA: Insufficient documentation

## 2014-08-04 DIAGNOSIS — F329 Major depressive disorder, single episode, unspecified: Secondary | ICD-10-CM | POA: Insufficient documentation

## 2014-08-04 DIAGNOSIS — I10 Essential (primary) hypertension: Secondary | ICD-10-CM | POA: Diagnosis not present

## 2014-08-04 DIAGNOSIS — R079 Chest pain, unspecified: Secondary | ICD-10-CM | POA: Diagnosis present

## 2014-08-04 DIAGNOSIS — G8929 Other chronic pain: Secondary | ICD-10-CM | POA: Diagnosis not present

## 2014-08-04 DIAGNOSIS — R1013 Epigastric pain: Secondary | ICD-10-CM | POA: Diagnosis not present

## 2014-08-04 DIAGNOSIS — R1011 Right upper quadrant pain: Secondary | ICD-10-CM

## 2014-08-04 LAB — I-STAT TROPONIN, ED
Troponin i, poc: 0 ng/mL (ref 0.00–0.08)
Troponin i, poc: 0 ng/mL (ref 0.00–0.08)

## 2014-08-04 LAB — BASIC METABOLIC PANEL
ANION GAP: 7 (ref 5–15)
BUN: 13 mg/dL (ref 6–20)
CHLORIDE: 101 mmol/L (ref 101–111)
CO2: 29 mmol/L (ref 22–32)
CREATININE: 0.84 mg/dL (ref 0.44–1.00)
Calcium: 8.7 mg/dL — ABNORMAL LOW (ref 8.9–10.3)
Glucose, Bld: 95 mg/dL (ref 65–99)
Potassium: 3.6 mmol/L (ref 3.5–5.1)
Sodium: 137 mmol/L (ref 135–145)

## 2014-08-04 LAB — CBC
HCT: 40.3 % (ref 36.0–46.0)
HEMOGLOBIN: 13.3 g/dL (ref 12.0–15.0)
MCH: 27.3 pg (ref 26.0–34.0)
MCHC: 33 g/dL (ref 30.0–36.0)
MCV: 82.6 fL (ref 78.0–100.0)
Platelets: 330 10*3/uL (ref 150–400)
RBC: 4.88 MIL/uL (ref 3.87–5.11)
RDW: 13.8 % (ref 11.5–15.5)
WBC: 6 10*3/uL (ref 4.0–10.5)

## 2014-08-04 LAB — HEPATIC FUNCTION PANEL
ALBUMIN: 3.4 g/dL — AB (ref 3.5–5.0)
ALT: 18 U/L (ref 14–54)
AST: 20 U/L (ref 15–41)
Alkaline Phosphatase: 73 U/L (ref 38–126)
BILIRUBIN TOTAL: 0.7 mg/dL (ref 0.3–1.2)
Bilirubin, Direct: 0.1 mg/dL (ref 0.1–0.5)
Indirect Bilirubin: 0.6 mg/dL (ref 0.3–0.9)
Total Protein: 6.4 g/dL — ABNORMAL LOW (ref 6.5–8.1)

## 2014-08-04 LAB — LIPASE, BLOOD: Lipase: 19 U/L — ABNORMAL LOW (ref 22–51)

## 2014-08-04 LAB — D-DIMER, QUANTITATIVE (NOT AT ARMC): D-Dimer, Quant: 0.29 ug/mL-FEU (ref 0.00–0.48)

## 2014-08-04 MED ORDER — GI COCKTAIL ~~LOC~~
30.0000 mL | Freq: Once | ORAL | Status: AC
  Administered 2014-08-04: 30 mL via ORAL
  Filled 2014-08-04: qty 30

## 2014-08-04 MED ORDER — PANTOPRAZOLE SODIUM 40 MG PO TBEC: 40.0000 mg | DELAYED_RELEASE_TABLET | Freq: Two times a day (BID) | ORAL | Status: DC

## 2014-08-04 NOTE — ED Notes (Signed)
Pt. reports intermittent mid/left chest pain onset last week with nausea and diaphoresis . Denies emesis , no cough or congestion .

## 2014-08-04 NOTE — ED Provider Notes (Signed)
CSN: [id number]     Arrival date & time 08/04/14  1913 History   First MD Initiated Contact with Patient 08/04/14 1951     Chief Complaint  Patient presents with  . Chest Pain     (Consider location/radiation/quality/duration/timing/severity/associated sxs/prior Treatment) HPI  54 year old female presents with intermittent lower chest/upper abdominal pain for the past 2 weeks. Pain comes and goes and feels like a sharp, dull, and aching pain. She's not sure what exactly makes the pain come and go. When asked about food, food does not directly make the pain worse but pretty consistently about 1 hour after eating this pain will come on. No shortness of breath. Occasionally has broken out in sweat with this pain. Does not feel nauseated and has not had any vomiting. Has chronic lower extremity swelling that is not different than normal. No history of cardiac disease. Has a history of hypertension but denies hyperlipidemia or diabetes. Her father had heart attacks at an early age. Patient does not have any history of DVT but does take oral estrogen.  Past Medical History  Diagnosis Date  . Chronic constipation   . MVP (mitral valve prolapse)     had ablation 1998  . Vitamin D deficiency   . Depression   . Arthritis   . Numbness in left leg     s/p spinal surgery  . Hypertension   . Supraventricular tachycardia     ablation 1998  . Chronic back pain    Past Surgical History  Procedure Laterality Date  . Av node ablation  1998    for svt  . Partial hysterectomy  1993  . Stimulation system implant  07/31/00    lumbar nerve stimulator-none funtioning now  . Back surgery  2001    lumbar fusion  . Repair extensor tendon Left 03/06/2013    Procedure: LEFT LONG BOUTONNIERE REPAIR;  Surgeon: Wyn Forsterobert V Sypher Jr., MD;  Location: [facility];  Service: Orthopedics;  Laterality: Left;  . Spine surgery     Family History  Problem Relation Age of Onset  . Coronary artery disease  Father   . Prostate cancer Maternal Grandfather   . Hodgkin's lymphoma Son   . Liver cancer Paternal Uncle     ?  Marland Kitchen. Colon polyps Father   . Colon cancer Neg Hx    History  Substance Use Topics  . Smoking status: Never Smoker   . Smokeless tobacco: Never Used  . Alcohol Use: No   OB History    No data available     Review of Systems  Constitutional: Negative for fever.  Respiratory: Negative for shortness of breath.   Cardiovascular: Positive for chest pain and leg swelling.  Gastrointestinal: Positive for abdominal pain. Negative for vomiting.  Musculoskeletal: Negative for back pain.  All other systems reviewed and are negative.     Allergies  Oxycodone-acetaminophen and Oxycontin  Home Medications   Prior to Admission medications   Medication Sig Start Date End Date Taking? Authorizing Provider  amphetamine-dextroamphetamine (ADDERALL) 10 MG tablet Take 10 mg by mouth 2 (two) times daily with a meal.  06/01/12   Historical Provider, MD  aspirin 81 MG tablet Take 81 mg by mouth daily.    Historical Provider, MD  buPROPion (WELLBUTRIN SR) 150 MG 12 hr tablet Take 150 mg by mouth 2 (two) times daily.     Historical Provider, MD  Cholecalciferol (VITAMIN D3) 2000 UNITS TABS Take 2,000 Units by mouth daily.  Historical Provider, MD  estradiol (ESTRACE) 1 MG tablet Take 1 mg by mouth daily. 09/25/13   Historical Provider, MD  furosemide (LASIX) 40 MG tablet TAKE 1 TABLET DAILY, MAY TAKE ADDITIONAL TABLET 1 OR 2 TIMES WEEKLY FOR INCREASED SWELLING 06/21/14   [name], FNP  lactulose (CHRONULAC) 10 GM/15ML solution Take 30 mLs (20 g total) by mouth 3 (three) times daily. 06/21/14   [name], FNP  lamoTRIgine (LAMICTAL) 100 MG tablet 150 mg daily.  11/27/13   Historical Provider, MD  meloxicam (MOBIC) 7.5 MG tablet Take 1 tablet (7.5 mg total) by mouth daily. 06/21/14   [name], FNP  montelukast (SINGULAIR) 10 MG tablet Take 1 tablet (10 mg total) by mouth at  bedtime. Patient not taking: Reported on 06/21/2014 05/05/14   [name], FNP  morphine (MSIR) 15 MG tablet Take 15 mg by mouth 3 (three) times daily.     Historical Provider, MD  Omega 3-6-9 Fatty Acids (OMEGA 3-6-9 COMPLEX) CAPS Take 1 capsule by mouth daily.      Historical Provider, MD  Potassium 99 MG TABS Take 1 tablet by mouth daily.     Historical Provider, MD  tiZANidine (ZANAFLEX) 4 MG tablet Take 4 mg by mouth daily as needed for muscle spasms.     Historical Provider, MD  vitamin B-12 (CYANOCOBALAMIN) 1000 MCG tablet Take 1,000 mcg by mouth daily.      Historical Provider, MD  Vitamin D, Ergocalciferol, (DRISDOL) 50000 UNITS CAPS capsule Take 1 capsule (50,000 Units total) by mouth every 7 (seven) days. 06/22/14   Christy A Hawks, FNP   BP 124/72 mmHg  Pulse 81  Temp(Src) 97.6 F (36.4 C) (Oral)  Resp 20  Ht 6' (1.829 m)  Wt 193 lb (87.544 kg)  BMI 26.17 kg/m2  SpO2 100% Physical Exam  Constitutional: She is oriented to person, place, and time. She appears well-developed and well-nourished.  HENT:  Head: Normocephalic and atraumatic.  Right Ear: External ear normal.  Left Ear: External ear normal.  Nose: Nose normal.  Eyes: Right eye exhibits no discharge. Left eye exhibits no discharge.  Cardiovascular: Normal rate, regular rhythm and normal heart sounds.   Pulmonary/Chest: Effort normal and breath sounds normal. She exhibits no tenderness.  Abdominal: Soft. There is tenderness (mild) in the epigastric area.  Musculoskeletal: She exhibits edema (trace non pitting pedal edema).  Neurological: She is alert and oriented to person, place, and time.  Skin: Skin is warm and dry.  Nursing note and vitals reviewed.   ED Course  Procedures (including critical care time) Labs Review Labs Reviewed  BASIC METABOLIC PANEL - Abnormal; Notable for the following:    Calcium 8.7 (*)    All other components within normal limits  HEPATIC FUNCTION PANEL - Abnormal; Notable for  the following:    Total Protein 6.4 (*)    Albumin 3.4 (*)    All other components within normal limits  LIPASE, BLOOD - Abnormal; Notable for the following:    Lipase 19 (*)    All other components within normal limits  CBC  D-DIMER, QUANTITATIVE (NOT AT [facility])  I-STAT TROPOININ, ED  Rosezena Sensor, ED    Imaging Review Dg Chest 2 View  08/04/2014   CLINICAL DATA:  54 year old female with 2 week history of chest pain  EXAM: CHEST  2 VIEW  COMPARISON:  Prior chest x-ray 09/29/2013  FINDINGS: The lungs are clear and negative for focal airspace consolidation, pulmonary edema  or suspicious pulmonary nodule. No pleural effusion or pneumothorax. Cardiac and mediastinal contours are within normal limits. No acute fracture or lytic or blastic osseous lesions. The visualized upper abdominal bowel gas pattern is unremarkable. Epidural spinal stimulator in unchanged position compared to prior imaging.  IMPRESSION: No active cardiopulmonary disease.   Electronically Signed   By: Malachy Moan M.D.   On: 08/04/2014 19:49     EKG Interpretation   Date/Time:  Wednesday August 04 2014 19:22:04 EDT Ventricular Rate:  89 PR Interval:  136 QRS Duration: 94 QT Interval:  368 QTC Calculation: 447 R Axis:   12 Text Interpretation:  Normal sinus rhythm Possible Left atrial enlargement  Borderline ECG no significant change since August 2015 Confirmed by  Criss Alvine  MD, Yailin Biederman ([id number]) on 08/04/2014 7:43:36 PM      MDM   Final diagnoses:  Epigastric pain    Patient's pain is intermittent but seems to mostly be abdominal in nature. Low suspicion this is ACS. Is low risk with 2 negative troponins and benign ECG. Given some inspiratory component a ddimer was sent (is on estrogen) and this is negative. Feel no further PE workup indicated. She is very concerned about her gallbladder (through friend's suggestion) but RUQ u/s is negative. Likely gastritis. Hgb is normal, doubt bleeding ulcer. D/c with PPI and  close pcp f/u.    Pricilla Loveless, MD 08/05/14 [phone]

## 2014-08-04 NOTE — Discharge Instructions (Signed)
Abdominal Pain °Many things can cause abdominal pain. Usually, abdominal pain is not caused by a disease and will improve without treatment. It can often be observed and treated at home. Your health care provider will do a physical exam and possibly order blood tests and X-rays to help determine the seriousness of your pain. However, in many cases, more time must pass before a clear cause of the pain can be found. Before that point, your health care provider may not know if you need more testing or further treatment. °HOME CARE INSTRUCTIONS  °Monitor your abdominal pain for any changes. The following actions may help to alleviate any discomfort you are experiencing: °· Only take over-the-counter or prescription medicines as directed by your health care provider. °· Do not take laxatives unless directed to do so by your health care provider. °· Try a clear liquid diet (broth, tea, or water) as directed by your health care provider. Slowly move to a bland diet as tolerated. °SEEK MEDICAL CARE IF: °· You have unexplained abdominal pain. °· You have abdominal pain associated with nausea or diarrhea. °· You have pain when you urinate or have a bowel movement. °· You experience abdominal pain that wakes you in the night. °· You have abdominal pain that is worsened or improved by eating food. °· You have abdominal pain that is worsened with eating fatty foods. °· You have a fever. °SEEK IMMEDIATE MEDICAL CARE IF:  °· Your pain does not go away within 2 hours. °· You keep throwing up (vomiting). °· Your pain is felt only in portions of the abdomen, such as the right side or the left lower portion of the abdomen. °· You pass bloody or black tarry stools. °MAKE SURE YOU: °· Understand these instructions.   °· Will watch your condition.   °· Will get help right away if you are not doing well or get worse.   °Document Released: 11/01/2004 Document Revised: 01/27/2013 Document Reviewed: 10/01/2012 °Exit[employer]® Patient Information  ©2015 [employer]. This information is not intended to replace advice given to you by your health care provider. Make sure you discuss any questions you have with your health care provider. ° °Gastritis, Adult °Gastritis is soreness and swelling (inflammation) of the lining of the stomach. Gastritis can develop as a sudden onset (acute) or long-term (chronic) condition. If gastritis is not treated, it can lead to stomach bleeding and ulcers. °CAUSES  °Gastritis occurs when the stomach lining is weak or damaged. Digestive juices from the stomach then inflame the weakened stomach lining. The stomach lining may be weak or damaged due to viral or bacterial infections. One common bacterial infection is the Helicobacter pylori infection. Gastritis can also result from excessive alcohol consumption, taking certain medicines, or having too much acid in the stomach.  °SYMPTOMS  °In some cases, there are no symptoms. When symptoms are present, they may include: °· Pain or a burning sensation in the upper abdomen. °· Nausea. °· Vomiting. °· An uncomfortable feeling of fullness after eating. °DIAGNOSIS  °Your caregiver may suspect you have gastritis based on your symptoms and a physical exam. To determine the cause of your gastritis, your caregiver may perform the following: °· Blood or stool tests to check for the H pylori bacterium. °· Gastroscopy. A thin, flexible tube (endoscope) is passed down the esophagus and into the stomach. The endoscope has a light and camera on the end. Your caregiver uses the endoscope to view the inside of the stomach. °· Taking a tissue sample (biopsy)   sample (biopsy) from the stomach to examine under a microscope. TREATMENT  Depending on the cause of your gastritis, medicines may be prescribed. If you have a bacterial infection, such as an H pylori infection, antibiotics may be given. If your gastritis is caused by too much acid in the stomach, H2 blockers or antacids may be given. Your caregiver may  recommend that you stop taking aspirin, ibuprofen, or other nonsteroidal anti-inflammatory drugs (NSAIDs). HOME CARE INSTRUCTIONS  Only take over-the-counter or prescription medicines as directed by your caregiver.  If you were given antibiotic medicines, take them as directed. Finish them even if you start to feel better.  Drink enough fluids to keep your urine clear or pale yellow.  Avoid foods and drinks that make your symptoms worse, such as:  Caffeine or alcoholic drinks.  Chocolate.  Peppermint or mint flavorings.  Garlic and onions.  Spicy foods.  Citrus fruits, such as oranges, lemons, or limes.  Tomato-based foods such as sauce, chili, salsa, and pizza.  Fried and fatty foods.  Eat small, frequent meals instead of large meals. SEEK IMMEDIATE MEDICAL CARE IF:   You have black or dark red stools.  You vomit blood or material that looks like coffee grounds.  You are unable to keep fluids down.  Your abdominal pain gets worse.  You have a fever.  You do not feel better after 1 week.  You have any other questions or concerns. MAKE SURE YOU:  Understand these instructions.  Will watch your condition.  Will get help right away if you are not doing well or get worse. Document Released: 01/16/2001 Document Revised: 07/24/2011 Document Reviewed: 03/07/2011 [facility] Patient Information 2015 [place], [place]. This information is not intended to replace advice given to you by your health care provider. Make sure you discuss any questions you have with your health care provider.   Chest Pain (Nonspecific) It is often hard to give a specific diagnosis for the cause of chest pain. There is always a chance that your pain could be related to something serious, such as a heart attack or a blood clot in the lungs. You need to follow up with your health care provider for further evaluation. CAUSES   Heartburn.  Pneumonia or bronchitis.  Anxiety or  stress.  Inflammation around your heart (pericarditis) or lung (pleuritis or pleurisy).  A blood clot in the lung.  A collapsed lung (pneumothorax). It can develop suddenly on its own (spontaneous pneumothorax) or from trauma to the chest.  Shingles infection (herpes zoster virus). The chest wall is composed of bones, muscles, and cartilage. Any of these can be the source of the pain.  The bones can be bruised by injury.  The muscles or cartilage can be strained by coughing or overwork.  The cartilage can be affected by inflammation and become sore (costochondritis). DIAGNOSIS  Lab tests or other studies may be needed to find the cause of your pain. Your health care provider may have you take a test called an ambulatory electrocardiogram (ECG). An ECG records your heartbeat patterns over a 24-hour period. You may also have other tests, such as:  Transthoracic echocardiogram (TTE). During echocardiography, sound waves are used to evaluate how blood flows through your heart.  Transesophageal echocardiogram (TEE).  Cardiac monitoring. This allows your health care provider to monitor your heart rate and rhythm in real time.  Holter monitor. This is a portable device that records your heartbeat and can help diagnose heart arrhythmias. It allows your health care  provider to track your heart activity for several days, if needed.  Stress tests by exercise or by giving medicine that makes the heart beat faster. TREATMENT   Treatment depends on what may be causing your chest pain. Treatment may include:  Acid blockers for heartburn.  Anti-inflammatory medicine.  Pain medicine for inflammatory conditions.  Antibiotics if an infection is present.  You may be advised to change lifestyle habits. This includes stopping smoking and avoiding alcohol, caffeine, and chocolate.  You may be advised to keep your head raised (elevated) when sleeping. This reduces the chance of acid going backward  from your stomach into your esophagus. Most of the time, nonspecific chest pain will improve within 2-3 days with rest and mild pain medicine.  HOME CARE INSTRUCTIONS   If antibiotics were prescribed, take them as directed. Finish them even if you start to feel better.  For the next few days, avoid physical activities that bring on chest pain. Continue physical activities as directed.  Do not use any tobacco products, including cigarettes, chewing tobacco, or electronic cigarettes.  Avoid drinking alcohol.  Only take medicine as directed by your health care provider.  Follow your health care provider's suggestions for further testing if your chest pain does not go away.  Keep any follow-up appointments you made. If you do not go to an appointment, you could develop lasting (chronic) problems with pain. If there is any problem keeping an appointment, call to reschedule. SEEK MEDICAL CARE IF:   Your chest pain does not go away, even after treatment.  You have a rash with blisters on your chest.  You have a fever. SEEK IMMEDIATE MEDICAL CARE IF:   You have increased chest pain or pain that spreads to your arm, neck, jaw, back, or abdomen.  You have shortness of breath.  You have an increasing cough, or you cough up blood.  You have severe back or abdominal pain.  You feel nauseous or vomit.  You have severe weakness.  You faint.  You have chills. This is an emergency. Do not wait to see if the pain will go away. Get medical help at once. Call your local emergency services (911 in [place] Do not drive yourself to the hospital. MAKE SURE YOU:   Understand these instructions.  Will watch your condition.  Will get help right away if you are not doing well or get worse. Document Released: 11/01/2004 Document Revised: 01/27/2013 Document Reviewed: 08/28/2007 [facility] Patient Information 2015 [place]. This information is not intended to replace advice given to you by  your health care provider. Make sure you discuss any questions you have with your health care provider.

## 2014-08-05 ENCOUNTER — Telehealth: Payer: Self-pay | Admitting: Family

## 2014-08-05 ENCOUNTER — Ambulatory Visit (INDEPENDENT_AMBULATORY_CARE_PROVIDER_SITE_OTHER): Payer: Medicare HMO | Admitting: Family Medicine

## 2014-08-05 VITALS — BP 107/68 | HR 85 | Temp 97.2°F | Ht 68.0 in | Wt 196.8 lb

## 2014-08-05 DIAGNOSIS — R1013 Epigastric pain: Secondary | ICD-10-CM

## 2014-08-05 NOTE — Telephone Encounter (Signed)
Patient states that she continues to have pain in upper abd/chest.  Given "something to drink" at the ER last night and that only helped briefly.  She woke up several times during the night with the pain and would like to be seen today to follow up. Appt scheduled for this afternoon with Dr Darlyn ReadStacks. Patient aware. She will call back if symptoms worsen.

## 2014-08-05 NOTE — Progress Notes (Signed)
Subjective:  Patient ID: Molly Castaneda, female    DOB: 1960/11/10  Age: 54 y.o. MRN: [id number]  CC: Abdominal Pain   HPI Molly HarperShelia A Padia presents for onset 2 weeks ago of increasing abdominal pain. She describes it as a hard pain located in the epigastrium. It is a severe dull ache with sharp twinges at times. It is described as severe. It primarily occurs at night. She has to sleep sitting up due to the pain. [facility] and had tests run. Of note is that she does have chronic constipation from her use of 3 times a day morphine daily for her back pain. She recently started on Chronulac for that. She denies nausea vomiting and diarrhea. Although she does not today related to food apparently she told the physician in the emergency room that the pain seemed to start about an hour after eating. He was ruled out for MI through serial troponins and benign EKG. Also she had right upper quadrant ultrasound which was negative for gallbladder disease. She was diagnosed with gastritis. However the patient today states that she does not have heartburn and that she knows what heartburn feels like and she is 54 years old and she would know it was heartburn and this is not heartburn so she disagrees with that diagnosis and wants a second opinion.  History Silvio PateShelia has a past medical history of Chronic constipation; MVP (mitral valve prolapse); Vitamin D deficiency; Depression; Arthritis; Numbness in left leg; Hypertension; Supraventricular tachycardia; and Chronic back pain.   She has past surgical history that includes AV node ablation (1998); Partial hysterectomy (1993); stimulation system implant (07/31/00); Back surgery (2001); Repair extensor tendon (Left, 03/06/2013); and Spine surgery.   Her family history includes Colon polyps in her father; Coronary artery disease in her father; Hodgkin's lymphoma in her son; Liver cancer in her paternal uncle; Prostate cancer in  her maternal grandfather. There is no history of Colon cancer.She reports that she has never smoked. She has never used smokeless tobacco. She reports that she does not drink alcohol or use illicit drugs.  Outpatient Prescriptions Prior to Visit  Medication Sig Dispense Refill  . amphetamine-dextroamphetamine (ADDERALL) 10 MG tablet Take 10 mg by mouth 2 (two) times daily with a meal.     . aspirin 81 MG tablet Take 81 mg by mouth daily.    Marland Kitchen. buPROPion (WELLBUTRIN SR) 150 MG 12 hr tablet Take 150 mg by mouth 2 (two) times daily.     Marland Kitchen. estradiol (ESTRACE) 1 MG tablet Take 1 mg by mouth daily.    . furosemide (LASIX) 40 MG tablet TAKE 1 TABLET DAILY, MAY TAKE ADDITIONAL TABLET 1 OR 2 TIMES WEEKLY FOR INCREASED SWELLING (Patient taking differently: Take 40 mg by mouth 2 (two) times daily. ) 45 tablet 3  . lactulose (CHRONULAC) 10 GM/15ML solution Take 30 mLs (20 g total) by mouth 3 (three) times daily. (Patient taking differently: Take 20 g by mouth daily as needed for moderate constipation. ) 240 mL 6  . meloxicam (MOBIC) 7.5 MG tablet Take 1 tablet (7.5 mg total) by mouth daily. 30 tablet 3  . morphine (MSIR) 15 MG tablet Take 15 mg by mouth 3 (three) times daily.     . Omega 3-6-9 Fatty Acids (OMEGA 3-6-9 COMPLEX) CAPS Take 1 capsule by mouth daily.      . Potassium 99 MG TABS Take 1 tablet by mouth daily.     .Marland Kitchen  tiZANidine (ZANAFLEX) 4 MG tablet Take 4 mg by mouth at bedtime as needed for muscle spasms.     . vitamin B-12 (CYANOCOBALAMIN) 1000 MCG tablet Take 1,000 mcg by mouth daily.      . pantoprazole (PROTONIX) 40 MG tablet Take 1 tablet (40 mg total) by mouth 2 (two) times daily. (Patient not taking: Reported on 08/05/2014) 28 tablet 0  . montelukast (SINGULAIR) 10 MG tablet Take 1 tablet (10 mg total) by mouth at bedtime. (Patient not taking: Reported on 08/05/2014) 90 tablet 3  . Vitamin D, Ergocalciferol, (DRISDOL) 50000 UNITS CAPS capsule Take 1 capsule (50,000 Units total) by mouth every 7  (seven) days. (Patient not taking: Reported on 08/05/2014) 12 capsule 3   No facility-administered medications prior to visit.    ROS Review of Systems  Constitutional: Negative for fever, chills, diaphoresis, appetite change, fatigue and unexpected weight change.  HENT: Negative for congestion, ear pain, hearing loss, postnasal drip, rhinorrhea, sneezing, sore throat and trouble swallowing.   Eyes: Negative for pain.  Respiratory: Negative for cough, chest tightness and shortness of breath.   Cardiovascular: Negative for chest pain and palpitations.  Gastrointestinal:       See history of present illness  Genitourinary: Negative for dysuria, frequency and menstrual problem.  Musculoskeletal: Negative for joint swelling and arthralgias.  Skin: Negative for rash.  Neurological: Negative for dizziness, weakness, numbness and headaches.  Psychiatric/Behavioral: Negative for dysphoric mood and agitation.    Objective:  BP 107/68 mmHg  Pulse 85  Temp(Src) 97.2 F (36.2 C) (Oral)  Ht  (1.727 m)  Wt 196 lb 12.8 oz (89.268 kg)  BMI 29.93 kg/m2  BP Readings from Last 3 Encounters:  08/05/14 107/68  08/04/14 114/68  06/21/14 118/78    Wt Readings from Last 3 Encounters:  08/05/14 196 lb 12.8 oz (89.268 kg)  08/04/14 193 lb (87.544 kg)  06/21/14 196 lb (88.905 kg)     Physical Exam  Constitutional: She is oriented to person, place, and time. She appears well-developed and well-nourished. No distress.  HENT:  Head: Normocephalic and atraumatic.  Right Ear: External ear normal.  Left Ear: External ear normal.  Nose: Nose normal.  Mouth/Throat: Oropharynx is clear and moist.  Eyes: Conjunctivae and EOM are normal. Pupils are equal, round, and reactive to light.  Neck: Normal range of motion. Neck supple. No thyromegaly present.  Cardiovascular: Normal rate, regular rhythm and normal heart sounds.   No murmur heard. Pulmonary/Chest: Effort normal and breath sounds normal.  No respiratory distress. She has no wheezes. She has no rales.  Abdominal: Soft. Bowel sounds are normal. She exhibits no distension and no mass. There is tenderness (mild at epigastrium). There is no rebound and no guarding.  Musculoskeletal: Normal range of motion.  Lymphadenopathy:    She has no cervical adenopathy.  Neurological: She is alert and oriented to person, place, and time. She has normal reflexes.  Skin: Skin is warm and dry.  Psychiatric: She has a normal mood and affect. Her behavior is normal. Judgment and thought content normal.    No results found for: HGBA1C  Lab Results  Component Value Date   WBC 6.0 08/04/2014   HGB 13.3 08/04/2014   HCT 40.3 08/04/2014   PLT 330 08/04/2014   GLUCOSE 95 08/04/2014   CHOL 181 01/21/2013   TRIG 42 01/21/2013   HDL 72 01/21/2013   LDLCALC 101* 01/21/2013   ALT 18 08/04/2014   AST 20 08/04/2014   NA  137 08/04/2014   K 3.6 08/04/2014   CL 101 08/04/2014   CREATININE 0.84 08/04/2014   BUN 13 08/04/2014   CO2 29 08/04/2014   TSH 1.290 06/21/2014    Dg Chest 2 View  08/04/2014   CLINICAL DATA:  54 year old female with 2 week history of chest pain  EXAM: CHEST  2 VIEW  COMPARISON:  Prior chest x-ray 09/29/2013  FINDINGS: The lungs are clear and negative for focal airspace consolidation, pulmonary edema or suspicious pulmonary nodule. No pleural effusion or pneumothorax. Cardiac and mediastinal contours are within normal limits. No acute fracture or lytic or blastic osseous lesions. The visualized upper abdominal bowel gas pattern is unremarkable. Epidural spinal stimulator in unchanged position compared to prior imaging.  IMPRESSION: No active cardiopulmonary disease.   Electronically Signed   By: Malachy Moan M.D.   On: 08/04/2014 19:49   US Abdomen Limited Ruq  08/04/2014   CLINICAL DATA:  Right upper quadrant abdominal and chest pain. Initial encounter.  EXAM: US ABDOMEN LIMITED - RIGHT UPPER QUADRANT  COMPARISON:  None.   FINDINGS: Gallbladder:  No gallstones or wall thickening visualized. No sonographic Murphy sign noted. Small gallbladder fold noted incidentally.  Common bile duct:  Diameter: 2.2 mm  Liver:  Mildly heterogeneous in echotexture without focal abnormality demonstrated. No ascites.  IMPRESSION: 1. No evidence of gallbladder or biliary disease. 2. Mildly heterogeneous hepatic echotexture without focal abnormality.   Electronically Signed   By: Carey Bullocks M.D.   On: 08/04/2014 21:32    Assessment & Plan:   Shawanda was seen today for abdominal pain.  Diagnoses and all orders for this visit:  Abdominal pain, epigastric Orders: -     CT Abdomen Pelvis W Contrast; Future  I have discontinued Ms. Moehle's montelukast and Vitamin D (Ergocalciferol). I am also having her maintain her morphine, tiZANidine, buPROPion, OMEGA 3-6-9 COMPLEX, vitamin B-12, Potassium, amphetamine-dextroamphetamine, aspirin, estradiol, furosemide, meloxicam, lactulose, and pantoprazole.  No orders of the defined types were placed in this encounter.     Follow-up: Return in about 2 weeks (around 08/19/2014), or if symptoms worsen or fail to improve.  Mechele Claude, M.D.

## 2014-08-06 ENCOUNTER — Ambulatory Visit
Admission: RE | Admit: 2014-08-06 | Discharge: 2014-08-06 | Disposition: A | Discharge status: Home / Self Care | Payer: Medicare HMO | Source: Ambulatory Visit

## 2014-08-06 DIAGNOSIS — Z1231 Encounter for screening mammogram for malignant neoplasm of breast: Secondary | ICD-10-CM

## 2014-08-18 ENCOUNTER — Telehealth: Payer: Self-pay | Admitting: Family Medicine

## 2014-08-19 ENCOUNTER — Ambulatory Visit: Payer: Medicare HMO | Admitting: Family Medicine

## 2014-08-23 ENCOUNTER — Ambulatory Visit (HOSPITAL_COMMUNITY)
Admission: RE | Admit: 2014-08-23 | Discharge: 2014-08-23 | Disposition: A | Discharge status: Home / Self Care | Payer: Medicare HMO | Source: Ambulatory Visit | Attending: Family Medicine | Admitting: Family Medicine

## 2014-08-23 DIAGNOSIS — R208 Other disturbances of skin sensation: Secondary | ICD-10-CM | POA: Insufficient documentation

## 2014-08-23 DIAGNOSIS — R933 Abnormal findings on diagnostic imaging of other parts of digestive tract: Secondary | ICD-10-CM | POA: Diagnosis not present

## 2014-08-23 DIAGNOSIS — R1010 Upper abdominal pain, unspecified: Secondary | ICD-10-CM | POA: Diagnosis present

## 2014-08-23 DIAGNOSIS — R1013 Epigastric pain: Secondary | ICD-10-CM

## 2014-08-23 MED ORDER — IOHEXOL 300 MG/ML  SOLN
100.0000 mL | Freq: Once | INTRAMUSCULAR | Status: AC | PRN
  Administered 2014-08-23: 100 mL via INTRAVENOUS

## 2014-08-24 ENCOUNTER — Other Ambulatory Visit: Payer: Self-pay | Admitting: Family Medicine

## 2014-08-24 MED ORDER — DEXLANSOPRAZOLE 60 MG PO CPDR: 60.0000 mg | DELAYED_RELEASE_CAPSULE | Freq: Two times a day (BID) | ORAL | Status: DC

## 2014-08-25 ENCOUNTER — Telehealth: Payer: Self-pay | Admitting: *Deleted

## 2014-08-25 NOTE — Telephone Encounter (Signed)
-----   Message from Mechele ClaudeWarren Stacks, MD sent at 08/24/2014 10:59 AM EDT ----- Tell patient that the CT scan is most consistent with an ulcer. Since it did not respond appropriately to pantoprazole in the past, I would like her to switch to Dexilant twice daily on an empty stomach. This is a much more potent medicine. However we may have to get prior authorization. Take the pantoprazole until the Dexilant is improved.

## 2014-08-26 ENCOUNTER — Telehealth: Payer: Self-pay | Admitting: *Deleted

## 2014-08-26 NOTE — Telephone Encounter (Signed)
Pt notified of results Verbalizes understanding 

## 2014-08-26 NOTE — Telephone Encounter (Signed)
-----   Message from Warren Stacks, MD sent at 08/24/2014 10:59 AM EDT ----- Tell patient that the CT scan is most consistent with an ulcer. Since it did not respond appropriately to pantoprazole in the past, I would like her to switch to Dexilant twice daily on an empty stomach. This is a much more potent medicine. However we may have to get prior authorization. Take the pantoprazole until the Dexilant is improved. 

## 2014-09-06 ENCOUNTER — Encounter: Payer: Self-pay | Admitting: Gastroenterology

## 2014-09-18 ENCOUNTER — Other Ambulatory Visit: Payer: Self-pay | Admitting: Family

## 2014-10-06 ENCOUNTER — Other Ambulatory Visit: Payer: Self-pay | Admitting: Family

## 2014-10-17 ENCOUNTER — Other Ambulatory Visit: Payer: Self-pay | Admitting: Family

## 2014-11-05 ENCOUNTER — Other Ambulatory Visit: Payer: Self-pay | Admitting: Family

## 2014-11-11 ENCOUNTER — Ambulatory Visit (INDEPENDENT_AMBULATORY_CARE_PROVIDER_SITE_OTHER): Payer: Medicare HMO | Admitting: *Deleted

## 2014-11-11 DIAGNOSIS — Z23 Encounter for immunization: Secondary | ICD-10-CM | POA: Diagnosis not present

## 2014-12-06 ENCOUNTER — Other Ambulatory Visit: Payer: Self-pay | Admitting: Family

## 2014-12-07 NOTE — Telephone Encounter (Signed)
Last seen 08/05/14  Dr Stacks 

## 2014-12-09 DIAGNOSIS — Z6828 Body mass index (BMI) 28.0-28.9, adult: Secondary | ICD-10-CM | POA: Diagnosis not present

## 2014-12-09 DIAGNOSIS — Z01419 Encounter for gynecological examination (general) (routine) without abnormal findings: Secondary | ICD-10-CM | POA: Diagnosis not present

## 2014-12-19 ENCOUNTER — Other Ambulatory Visit: Payer: Self-pay | Admitting: Family

## 2014-12-23 ENCOUNTER — Ambulatory Visit: Payer: Medicare HMO | Admitting: Family

## 2015-02-24 ENCOUNTER — Other Ambulatory Visit: Payer: Self-pay | Admitting: Family

## 2015-02-24 NOTE — Telephone Encounter (Signed)
Last seen 08/05/14 Dr Darlyn Read  Last Vit D 06/21/14  28.8

## 2015-03-02 DIAGNOSIS — R69 Illness, unspecified: Secondary | ICD-10-CM | POA: Diagnosis not present

## 2015-03-02 DIAGNOSIS — M5432 Sciatica, left side: Secondary | ICD-10-CM | POA: Diagnosis not present

## 2015-03-02 DIAGNOSIS — G8921 Chronic pain due to trauma: Secondary | ICD-10-CM | POA: Diagnosis not present

## 2015-03-02 DIAGNOSIS — M545 Low back pain: Secondary | ICD-10-CM | POA: Diagnosis not present

## 2015-05-27 DIAGNOSIS — M545 Low back pain: Secondary | ICD-10-CM | POA: Diagnosis not present

## 2015-05-27 DIAGNOSIS — G8921 Chronic pain due to trauma: Secondary | ICD-10-CM | POA: Diagnosis not present

## 2015-05-27 DIAGNOSIS — G8929 Other chronic pain: Secondary | ICD-10-CM | POA: Diagnosis not present

## 2015-05-27 DIAGNOSIS — R69 Illness, unspecified: Secondary | ICD-10-CM | POA: Diagnosis not present

## 2015-05-27 DIAGNOSIS — M25572 Pain in left ankle and joints of left foot: Secondary | ICD-10-CM | POA: Diagnosis not present

## 2015-05-30 ENCOUNTER — Telehealth: Payer: Self-pay | Admitting: Family Medicine

## 2015-07-05 ENCOUNTER — Telehealth: Payer: Self-pay | Admitting: Family Medicine

## 2015-09-07 ENCOUNTER — Ambulatory Visit (INDEPENDENT_AMBULATORY_CARE_PROVIDER_SITE_OTHER): Payer: Medicare HMO

## 2015-09-07 ENCOUNTER — Ambulatory Visit (INDEPENDENT_AMBULATORY_CARE_PROVIDER_SITE_OTHER): Payer: Medicare HMO | Admitting: Family

## 2015-09-07 ENCOUNTER — Encounter: Payer: Self-pay | Admitting: Family

## 2015-09-07 VITALS — BP 102/69 | HR 83 | Temp 97.5°F | Ht 68.0 in | Wt 192.6 lb

## 2015-09-07 DIAGNOSIS — Z780 Asymptomatic menopausal state: Secondary | ICD-10-CM

## 2015-09-07 DIAGNOSIS — F329 Major depressive disorder, single episode, unspecified: Secondary | ICD-10-CM

## 2015-09-07 DIAGNOSIS — R600 Localized edema: Secondary | ICD-10-CM

## 2015-09-07 DIAGNOSIS — M8580 Other specified disorders of bone density and structure, unspecified site: Secondary | ICD-10-CM | POA: Diagnosis not present

## 2015-09-07 DIAGNOSIS — Z1159 Encounter for screening for other viral diseases: Secondary | ICD-10-CM

## 2015-09-07 DIAGNOSIS — R609 Edema, unspecified: Secondary | ICD-10-CM | POA: Diagnosis not present

## 2015-09-07 DIAGNOSIS — IMO0001 Reserved for inherently not codable concepts without codable children: Secondary | ICD-10-CM

## 2015-09-07 DIAGNOSIS — M5442 Lumbago with sciatica, left side: Secondary | ICD-10-CM

## 2015-09-07 DIAGNOSIS — F411 Generalized anxiety disorder: Secondary | ICD-10-CM | POA: Diagnosis not present

## 2015-09-07 DIAGNOSIS — Z136 Encounter for screening for cardiovascular disorders: Secondary | ICD-10-CM | POA: Diagnosis not present

## 2015-09-07 DIAGNOSIS — Z13220 Encounter for screening for lipoid disorders: Secondary | ICD-10-CM

## 2015-09-07 DIAGNOSIS — E559 Vitamin D deficiency, unspecified: Secondary | ICD-10-CM

## 2015-09-07 DIAGNOSIS — F32A Depression, unspecified: Secondary | ICD-10-CM

## 2015-09-07 DIAGNOSIS — K5900 Constipation, unspecified: Secondary | ICD-10-CM | POA: Diagnosis not present

## 2015-09-07 DIAGNOSIS — R69 Illness, unspecified: Secondary | ICD-10-CM | POA: Diagnosis not present

## 2015-09-07 MED ORDER — LINACLOTIDE 145 MCG PO CAPS: 145.0000 ug | ORAL_CAPSULE | Freq: Every day | ORAL | 1 refills | Status: DC

## 2015-09-07 MED ORDER — FUROSEMIDE 40 MG PO TABS: ORAL_TABLET | ORAL | 3 refills | Status: DC

## 2015-09-07 MED ORDER — TIZANIDINE HCL 4 MG PO TABS: 4.0000 mg | ORAL_TABLET | Freq: Every evening | ORAL | 1 refills | Status: DC | PRN

## 2015-09-07 NOTE — Patient Instructions (Signed)

## 2015-09-07 NOTE — Progress Notes (Signed)
Subjective:    Patient ID: Molly Castaneda, female    DOB: 23-Dec-1960, 55 y.o.   MRN: [id number]  Pt presents to the office today for medication refill and lab work. Pt is followed by pain clinic every 3 months for chronic low back pain with left leg pain.  Medication Refill  Associated symptoms include nausea (Some times). Pertinent negatives include no headaches.  Anxiety  Presents for follow-up visit. Onset was 1 to 6 months ago. The problem has been waxing and waning. Symptoms include excessive worry, nausea (Some times) and nervous/anxious behavior. Patient reports no decreased concentration, hyperventilation, insomnia, irritability, palpitations, panic or shortness of breath. Symptoms occur occasionally. The severity of symptoms is moderate. The symptoms are aggravated by family issues. The quality of sleep is fair. Nighttime awakenings: several.   Her past medical history is significant for anxiety/panic attacks and depression. Past treatments include benzodiazephines and SSRIs. The treatment provided moderate relief. Compliance with prior treatments has been good.  Constipation  This is a chronic problem. The current episode started more than 1 year ago. The problem has been waxing and waning since onset. Her stool frequency is 1 time per week or less. The patient is on a high fiber diet. There has been adequate water intake. Associated symptoms include back pain and nausea (Some times). Pertinent negatives include no diarrhea or weight loss. She has tried diet changes, stool softeners and laxatives for the symptoms. The treatment provided mild relief.  Back Pain  This is a chronic problem. The current episode started more than 1 year ago. The problem occurs constantly. The problem is unchanged. The pain is at a severity of 7/10. The pain is moderate. The symptoms are aggravated by standing and bending. Pertinent negatives include no headaches or weight loss. She has tried analgesics for the  symptoms.  Edema PT states she has swelling in bilateral legs, breasts, and arms. Pt states it is "all over".  Pt states she takes lasix daily , but has been taking 2-3 a day. Osteopenia Pt's last Bone Density 02/11/2013.  Review of Systems  Constitutional: Negative.  Negative for irritability and weight loss.  HENT: Negative.   Eyes: Negative.   Respiratory: Negative.  Negative for shortness of breath.   Cardiovascular: Negative.  Negative for palpitations.  Gastrointestinal: Positive for constipation and nausea (Some times). Negative for diarrhea.  Endocrine: Negative.   Genitourinary: Negative.   Musculoskeletal: Positive for back pain.  Neurological: Negative.  Negative for headaches.  Hematological: Negative.   Psychiatric/Behavioral: Negative for decreased concentration. The patient is nervous/anxious. The patient does not have insomnia.   All other systems reviewed and are negative.      Objective:   Physical Exam  Constitutional: She is oriented to person, place, and time. She appears well-developed and well-nourished. No distress.  HENT:  Head: Normocephalic and atraumatic.  Right Ear: External ear normal.  Left Ear: External ear normal.  Nose: Nose normal.  Mouth/Throat: Oropharynx is clear and moist.  Eyes: Pupils are equal, round, and reactive to light.  Neck: Normal range of motion. Neck supple. No thyromegaly present.  Cardiovascular: Normal rate, regular rhythm, normal heart sounds and intact distal pulses.   No murmur heard. Pulmonary/Chest: Effort normal and breath sounds normal. No respiratory distress. She has no wheezes.  Abdominal: Soft. Bowel sounds are normal. She exhibits no distension. There is no tenderness.  Musculoskeletal: Normal range of motion. She exhibits edema (trace amt in bilateral LE). She exhibits no tenderness.  Neurological: She is alert and oriented to person, place, and time. She has normal reflexes. No cranial nerve deficit.  Skin:  Skin is warm and dry.  Psychiatric: She has a normal mood and affect. Her behavior is normal. Judgment and thought content normal.  Vitals reviewed.   BP 102/69   Pulse 83   Temp 97.5 F (36.4 C) (Oral)   Ht _0  (1.727 m)   Wt 192 lb 9.6 oz (87.4 kg)   BMI 29.28 kg/m        Assessment & Plan:  1. Left-sided low back pain with left-sided sciatica - CMP14+EGFR - tiZANidine (ZANAFLEX) 4 MG tablet; Take 1 tablet (4 mg total) by mouth at bedtime as needed for muscle spasms.  Dispense: 30 tablet; Refill: 1  2. Depression - CMP14+EGFR  3. GAD (generalized anxiety disorder) - CMP14+EGFR  4. Vitamin D deficiency - CMP14+EGFR  5. Osteopenia - CMP14+EGFR  6. Constipation, unspecified constipation type - CMP14+EGFR - linaclotide (LINZESS) 145 MCG CAPS capsule; Take 1 capsule (145 mcg total) by mouth daily before breakfast.  Dispense: 90 capsule; Refill: 1  7. Peripheral edem - CMP14+EGFR - furosemide (LASIX) 40 MG tablet; TAKE 1 TABLET DAILY, MAY TAKE ADDITIONAL TABLET 1 OR 2 TIMES WEEKLY FO  R INCREASED SWELLING  Dispense: 45 tablet; Refill: 3 - Brain natriuretic peptide  8. Encounter for cholesteral screening for cardiovascular disease - CMP14+EGFR - Lipid panel  9. Need for hepatitis C screening test - Hepatitis C antibody  10. Post-menopause - DG [facility] DEXA   Continue all meds Labs pending Health Maintenance reviewed Diet and exercise encouraged RTO 6 months  Evelina Dun, FNP

## 2015-09-08 LAB — CMP14+EGFR
A/G RATIO: 1.7 (ref 1.2–2.2)
ALBUMIN: 4.3 g/dL (ref 3.5–5.5)
ALK PHOS: 86 IU/L (ref 39–117)
ALT: 18 IU/L (ref 0–32)
AST: 17 IU/L (ref 0–40)
BUN/Creatinine Ratio: 19 (ref 9–23)
BUN: 17 mg/dL (ref 6–24)
Bilirubin Total: 0.7 mg/dL (ref 0.0–1.2)
CO2: 27 mmol/L (ref 18–29)
Calcium: 9.2 mg/dL (ref 8.7–10.2)
Chloride: 98 mmol/L (ref 96–106)
Creatinine, Ser: 0.89 mg/dL (ref 0.57–1.00)
GFR calc Af Amer: 83 mL/min/{1.73_m2} (ref >59)
GFR calc non Af Amer: 72 mL/min/{1.73_m2} (ref >59)
GLOBULIN, TOTAL: 2.5 g/dL (ref 1.5–4.5)
Glucose: 82 mg/dL (ref 65–99)
POTASSIUM: 4 mmol/L (ref 3.5–5.2)
SODIUM: 140 mmol/L (ref 134–144)
Total Protein: 6.8 g/dL (ref 6.0–8.5)

## 2015-09-08 LAB — LIPID PANEL
CHOLESTEROL TOTAL: 198 mg/dL (ref 100–199)
Chol/HDL Ratio: 2.4 ratio units (ref 0.0–4.4)
HDL: 84 mg/dL (ref >39)
LDL CALC: 104 mg/dL — AB (ref 0–99)
Triglycerides: 49 mg/dL (ref 0–149)
VLDL Cholesterol Cal: 10 mg/dL (ref 5–40)

## 2015-09-08 LAB — BRAIN NATRIURETIC PEPTIDE: BNP: 15.1 pg/mL (ref 0.0–100.0)

## 2015-09-08 LAB — HEPATITIS C ANTIBODY: Hep C Virus Ab: <0.1 s/co ratio (ref 0.0–0.9)

## 2015-09-09 DIAGNOSIS — M5432 Sciatica, left side: Secondary | ICD-10-CM | POA: Diagnosis not present

## 2015-09-09 DIAGNOSIS — G8929 Other chronic pain: Secondary | ICD-10-CM | POA: Diagnosis not present

## 2015-09-09 DIAGNOSIS — M545 Low back pain: Secondary | ICD-10-CM | POA: Diagnosis not present

## 2015-09-09 DIAGNOSIS — R69 Illness, unspecified: Secondary | ICD-10-CM | POA: Diagnosis not present

## 2015-09-09 DIAGNOSIS — Z79891 Long term (current) use of opiate analgesic: Secondary | ICD-10-CM | POA: Diagnosis not present

## 2015-09-09 DIAGNOSIS — M25572 Pain in left ankle and joints of left foot: Secondary | ICD-10-CM | POA: Diagnosis not present

## 2015-12-01 DIAGNOSIS — M545 Low back pain: Secondary | ICD-10-CM | POA: Diagnosis not present

## 2015-12-01 DIAGNOSIS — G8929 Other chronic pain: Secondary | ICD-10-CM | POA: Diagnosis not present

## 2015-12-01 DIAGNOSIS — R69 Illness, unspecified: Secondary | ICD-10-CM | POA: Diagnosis not present

## 2015-12-01 DIAGNOSIS — M25572 Pain in left ankle and joints of left foot: Secondary | ICD-10-CM | POA: Diagnosis not present

## 2015-12-01 DIAGNOSIS — G8921 Chronic pain due to trauma: Secondary | ICD-10-CM | POA: Diagnosis not present

## 2015-12-14 ENCOUNTER — Encounter: Payer: Self-pay | Admitting: Pharmacist

## 2015-12-14 ENCOUNTER — Ambulatory Visit (INDEPENDENT_AMBULATORY_CARE_PROVIDER_SITE_OTHER): Payer: Medicare HMO | Admitting: Pharmacist

## 2015-12-14 ENCOUNTER — Ambulatory Visit (INDEPENDENT_AMBULATORY_CARE_PROVIDER_SITE_OTHER): Payer: Medicare HMO

## 2015-12-14 VITALS — BP 136/78 | HR 84 | Ht 68.0 in | Wt 195.0 lb

## 2015-12-14 DIAGNOSIS — R7989 Other specified abnormal findings of blood chemistry: Secondary | ICD-10-CM | POA: Diagnosis not present

## 2015-12-14 DIAGNOSIS — Z23 Encounter for immunization: Secondary | ICD-10-CM | POA: Diagnosis not present

## 2015-12-14 DIAGNOSIS — M8589 Other specified disorders of bone density and structure, multiple sites: Secondary | ICD-10-CM | POA: Diagnosis not present

## 2015-12-14 MED ORDER — CALCIUM CARBONATE 500 MG PO CHEW: 500.0000 mg | CHEWABLE_TABLET | Freq: Every day | ORAL | Status: AC

## 2015-12-14 MED ORDER — VITAMIN D (CHOLECALCIFEROL) 25 MCG (1000 UT) PO TABS: 1000.0000 [IU] | ORAL_TABLET | Freq: Every day | ORAL | Status: AC

## 2015-12-14 NOTE — Progress Notes (Signed)
Patient ID: Molly HarperShelia A Castaneda, female   DOB: 12-15-1960, 55 y.o.   MRN: [id number]   Osteoporosis Clinic Current Height: Height: 5\' 8"  (172.7 cm)      Max Lifetime Height:  5\' 8"  Current Weight: Weight: 195 lb (88.5 kg)       Ethnicity:African American  BP: BP: 136/78     HR:  Pulse Rate: 84      HPI: Does pt already have a diagnosis of:  Osteopenia?  Yes Osteoporosis?  No  Back Pain?  Yes       Kyphosis?  No Prior fracture?  No Med(s) for Osteoporosis/Osteopenia:  none Med(s) previously tried for Osteoporosis/Osteopenia:  none                                                             PMH: Age at menopause:  Partial hysterectomy - Surgical 1990's Hysterectomy?  Yes Oophorectomy?  No HRT? Yes - Current.  Type/duration: estradiol 1mg  qd - has taken for about 2 years Steroid Use?  No Thyroid med?  No History of cancer?  No History of digestive disorders (ie Crohn's)?  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  No Last Vitamin D Result:  28.8 (06/21/2014) Last GFR Result:  72 (09/07/2015)   FH/SH: Family history of osteoporosis?  No Parent with history of hip fracture?  No Family history of breast cancer?  No Exercise?  No Smoking?  No Alcohol?  No    Calcium Assessment Calcium Intake  # of servings/day  Calcium mg  Milk (8 oz) 0  x  300  = 0  Yogurt (4 oz) 0 x  200 = 0  Cheese (1 oz) 1 x  200 = 200mg   Other Calcium sources   250mg   Ca supplement 0 = 0   Estimated calcium intake per day 450mg     DEXA Results Date of Test T-Score for AP Spine L1-L4 T-Score for Neck of Left Hip Right Forearm / Radius  09/07/2015 -1.2 -1.3 0.2  02/11/2013 -0.9 -1.1   11/07/2011 -0.9 -0.9   07/08/2008 -0.8 -0.7    FRAX 10 year estimate: Total FX risk:  2.9%  (consider medication if >/= 20%) Hip FX risk:  0.2%  (consider medication if >/= 3%)  Assessment: Osteopenia with low FRAX estimated risk and low calcium intake  Low serum  vitamin D  Recommendations: 1.   Discussed BMD  / DEXA results and discussed fracture risk.  Continue estradiol 1mg  qd - if stops recommended recheck DEXA 6 months after 2.  recommend calcium 1200mg  daily through supplementation or diet.  Discussed getting calcium from diet to decrease constipation.  For supplement make sure to get calcium with magnesium in it to help with decrease constipation. 3.  recommend weight bearing exercise - 30 minutes at least 4 days per week.   4.  Counseled and educated about fall risk and prevention. 5.  Start vitamin D 1000IU qd - recheck in 3 months  Recheck DEXA:  2 years  Time spent counseling patient:  30 minutes

## 2015-12-14 NOTE — Patient Instructions (Signed)
Calcium & Vitamin D: The Facts  Why is calcium and vitamin D consumption important? Calcium: . Most Americans do not consume adequate amounts of calcium! Calcium is required for proper muscle function, nerve communication, bone support, and many other functions in the body.  . The body uses bones as a source of calcium. Bones 'remodel' themselves continuously - the body constantly breaks bone down to release calcium and rebuilds bones by replacing calcium in the bone later.  . As we get older, the rate of bone breakdown occurs faster than bone rebuilding which could lead to osteopenia, osteoporosis, and possible fractures.   Vitamin D: . People naturally make vitamin D in the body when sunlight hits the skin and triggers a process that leads to vitamin D production. This natural vitamin D production requires about 10-15 minutes of sun exposure on the hands, arms, and face at least 2-3 times per week. However, due to decreased sun exposure and the use of sunscreen, most people will need to get additional vitamin D from foods or supplements. Your doctor can measure your body's vitamin D level through a simple blood test to determine your daily vitamin D needs.  . Vitamin D is used to help the body absorb calcium, maintain bone health, help the immune system, and reduce inflammation. It also plays a role in muscle performance, balance and risk of falling.  . Vitamin D deficiency can lead to osteomalacia or softening of the bones, bone pain, and muscle weakness.   The recommended daily allowance of Calcium and Vitamin D varies for different age groups. Age group Calcium (mg) Vitamin D (IU)  Females and Males: Age 1-50 1000 mg 600 IU  Females: Age 16- 35 1200 mg 600 IU  Males: Age 83-70 1000 mg 600 IU  Females and Males: Age 19+ 1200 mg 800 IU  Pregnant/lactating Females age 29-50 1000 mg 600 IU   How much Calcium do you get in your diet? Calcium Intake # of servings per day  Total calcium (mg)   Skim milk, 2% milk (1 cup) _________ x 300 mg   Yogurt (1 small container) _________ x 200 mg   Cheese (1oz) _________ x 200 mg   Cottage Cheese (1 cup)       ________ x 150 mg   Almond milk (1 cup) _________ x 450 mg   Fortified Orange Juice (1 cup) _________ x 300 mg   Broccoli or spinach ( 1 cup) _________ x 100 mg   Salmon (3 oz) _________ x 150 mg    Almonds (1/4 cup) _______ x 90 mg      How do we get Calcium and Vitamin D in our diet? Calcium: . Obtaining calcium from the diet is the most preferred way to reach the recommended daily goal. If this goal is not reached through diet, calcium supplements are available.  . Calcium is found in many foods including: dairy products, dark leafy vegetables (like broccoli, kale, and spinach), fish, and fortified products like juices and cereals.  . The food label will have a %DV (percent daily value) listed showing the amount of calcium per serving. To determine the total mg per serving, simply replace the % with zero (0).  For example, Almond Breeze almond milk contains 45% DV of calcium or 493m per 1 cup.  . You can increase the amount of calcium in your diet by using more calcium products in your daily meals. Use yogurt and fruit to make smoothies or use yogurt to  top baked potatoes or make whipped potatoes. Sprinkle low fat cheese onto salads or into egg white omelets. You can even add non-fat dry milk powder ('300mg'$  calcium per 1/3 cup) to hot cereals, meat loaf, soups, or potatoes.  . Calcium supplements come in many forms including tablets, chewables, and gummies. Be sure to read the label to determine the correct number of tablets per serving and whether or not to take the supplement with food.  . Calcium carbonate products (Oscal, Caltrate, and Viactiv) are generally better absorbed when taken with food while calcium citrate products like Citracal can be taken with or without food.  . The body can only absorb about 600 mg of calcium at one  time. It is recommended to take calcium supplements in small amounts several times per day.  However, taking it all at once is better than not taking it at all. . Increasing your intake of calcium is essential for bone health, but may also lead to some side effects like constipation, increased gas, bloating or abdominal cramping. To help reduce these side effects, start with 1 tablet per day and slowly increase your intake of the supplement to the recommended doses. It is also recommended that you drink plenty of water each day. Or find a calcium supplement with magnesium.  Vitamin D: . Very few foods naturally contain vitamin D. However, it is found in saltwater fish (like tuna, salmon and mackerel), beef liver, egg yolks, cheese and vitamin D fortified foods (like yogurt, cereals, orange juice and milk) . The amount of vitamin D in each food or product is listed as %DV on the product label. To determine the total amount of vitamin D per serving, drop the % sign and multiply the number by 4. For example, 1 cup of Almond Breeze almond milk contains 25% DV vitamin D or 100 IU per serving (25 x 4 =100). . Vitamin D is also found in multivitamins and supplements and may be listed as ergocalciferol (vitamin D2) or cholecalciferol (vitamin D3). Each of these forms of vitamin D are equivalent and the daily recommended intake will vary based on your age and the vitamin D levels in your body. Follow your doctor's recommendation for vitamin D intake.                   Exercise for Strong Bones  Exercise is important to build and maintain strong bones / bone density.  There are 2 types of exercises that are important to building and maintaining strong bones:  Weight- bearing and muscle-stregthening.  Weight-bearing Exercises  These exercises include activities that make you move against gravity while staying upright. Weight-bearing exercises can be high-impact or low-impact.  High-impact weight-bearing  exercises help build bones and keep them strong. If you have broken a bone due to osteoporosis or are at risk of breaking a bone, you may need to avoid high-impact exercises. If you're not sure, you should check with your healthcare provider.  Examples of high-impact weight-bearing exercises are: Dancing  Doing high-impact aerobics  Hiking  Jogging/running  Jumping Rope  Stair climbing  Tennis  Low-impact weight-bearing exercises can also help keep bones strong and are a safe alternative if you cannot do high-impact exercises.   Examples of low-impact weight-bearing exercises are: Using elliptical training machines  Doing low-impact aerobics  Using stair-step machines  Fast walking on a treadmill or outside   Muscle-Strengthening Exercises These exercises include activities where you move your body, a weight or some other  resistance against gravity. They are also known as resistance exercises and include: Lifting weights  Using elastic exercise bands  Using weight machines  Lifting your own body weight  Functional movements, such as standing and rising up on your toes  Yoga and Pilates can also improve strength, balance and flexibility. However, certain positions may not be safe for people with osteoporosis or those at increased risk of broken bones. For example, exercises that have you bend forward may increase the chance of breaking a bone in the spine.   Non-Impact Exercises There are other types of exercises that can help prevent falls.  Non-impact exercises can help you to improve balance, posture and how well you move in everyday activities. Some of these exercises include: Balance exercises that strengthen your legs and test your balance, such as Tai Chi, can decrease your risk of falls.  Posture exercises that improve your posture and reduce rounded or "sloping" shoulders can help you decrease the chance of breaking a bone, especially in the spine.  Functional exercises that  improve how well you move can help you with everyday activities and decrease your chance of falling and breaking a bone. For example, if you have trouble getting up from a chair or climbing stairs, you should do these activities as exercises.   **A physical therapist can teach you balance, posture and functional exercises. He/she can also help you learn which exercises are safe and appropriate for you.  [facility] has a physical therapy office in [place] in front of our office and referrals can be made for assessments and treatment as needed and strength and balance training.  If you would like to have an assessment with [facility] and our physical therapy team please let a nurse or provider know.   Fall Prevention in the Home  Falls can cause injuries and can affect people from all age groups. There are many simple things that you can do to make your home safe and to help prevent falls. WHAT CAN I DO ON THE OUTSIDE OF MY HOME?  Regularly repair the edges of walkways and driveways and fix any cracks.  Remove high doorway thresholds.  Trim any shrubbery on the main path into your home.  Use bright outdoor lighting.  Clear walkways of debris and clutter, including tools and rocks.  Regularly check that handrails are securely fastened and in good repair. Both sides of any steps should have handrails.  Install guardrails along the edges of any raised decks or porches.  Have leaves, snow, and ice cleared regularly.  Use sand or salt on walkways during winter months.  In the garage, clean up any spills right away, including grease or oil spills. WHAT CAN I DO IN THE BATHROOM?  Use night lights.  Install grab bars by the toilet and in the tub and shower. Do not use towel bars as grab bars.  Use non-skid mats or decals on the floor of the tub or shower.  If you need to sit down while you are in the shower, use a plastic, non-slip stool.[employer]  Keep the floor dry. Immediately clean up any water  that spills on the floor.  Remove soap buildup in the tub or shower on a regular basis.  Attach bath mats securely with double-sided non-slip rug tape.  Remove throw rugs and other tripping hazards from the floor. WHAT CAN I DO IN THE BEDROOM?  Use night lights.  Make sure that a bedside light is easy to reach.  Do  not use oversized bedding that drapes onto the floor.  Have a firm chair that has side arms to use for getting dressed.  Remove throw rugs and other tripping hazards from the floor. WHAT CAN I DO IN THE KITCHEN?   Clean up any spills right away.  Avoid walking on wet floors.  Place frequently used items in easy-to-reach places.  If you need to reach for something above you, use a sturdy step stool that has a grab bar.  Keep electrical cables out of the way.  Do not use floor polish or wax that makes floors slippery. If you have to use wax, make sure that it is non-skid floor wax.  Remove throw rugs and other tripping hazards from the floor. WHAT CAN I DO IN THE STAIRWAYS?  Do not leave any items on the stairs.  Make sure that there are handrails on both sides of the stairs. Fix handrails that are broken or loose. Make sure that handrails are as long as the stairways.  Check any carpeting to make sure that it is firmly attached to the stairs. Fix any carpet that is loose or worn.  Avoid having throw rugs at the top or bottom of stairways, or secure the rugs with carpet tape to prevent them from moving.  Make sure that you have a light switch at the top of the stairs and the bottom of the stairs. If you do not have them, have them installed. WHAT ARE SOME OTHER FALL PREVENTION TIPS?  Wear closed-toe shoes that fit well and support your feet. Wear shoes that have rubber soles or low heels.  When you use a stepladder, make sure that it is completely opened and that the sides are firmly locked. Have someone hold the ladder while you are using it. Do not climb a  closed stepladder.  Add color or contrast paint or tape to grab bars and handrails in your home. Place contrasting color strips on the first and last steps.  Use mobility aids as needed, such as canes, walkers, scooters, and crutches.  Turn on lights if it is dark. Replace any light bulbs that burn out.  Set up furniture so that there are clear paths. Keep the furniture in the same spot.  Fix any uneven floor surfaces.  Choose a carpet design that does not hide the edge of steps of a stairway.  Be aware of any and all pets.  Review your medicines with your healthcare provider. Some medicines can cause dizziness or changes in blood pressure, which increase your risk of falling. Talk with your health care provider about other ways that you can decrease your risk of falls. This may include working with a physical therapist or trainer to improve your strength, balance, and endurance.   This information is not intended to replace advice given to you by your health care provider. Make sure you discuss any questions you have with your health care provider.   Document Released: 01/12/2002 Document Revised: 06/08/2014 Document Reviewed: 02/26/2014 Elsevier Interactive Patient Education [employer].

## 2016-03-02 DIAGNOSIS — G8929 Other chronic pain: Secondary | ICD-10-CM | POA: Diagnosis not present

## 2016-03-02 DIAGNOSIS — R69 Illness, unspecified: Secondary | ICD-10-CM | POA: Diagnosis not present

## 2016-03-02 DIAGNOSIS — M25572 Pain in left ankle and joints of left foot: Secondary | ICD-10-CM | POA: Diagnosis not present

## 2016-03-13 ENCOUNTER — Encounter: Payer: Self-pay | Admitting: Family

## 2016-03-13 ENCOUNTER — Ambulatory Visit (INDEPENDENT_AMBULATORY_CARE_PROVIDER_SITE_OTHER): Payer: Medicare HMO | Admitting: Family

## 2016-03-13 VITALS — BP 118/72 | HR 96 | Temp 98.0°F | Ht 68.0 in | Wt 202.0 lb

## 2016-03-13 DIAGNOSIS — F331 Major depressive disorder, recurrent, moderate: Secondary | ICD-10-CM | POA: Diagnosis not present

## 2016-03-13 DIAGNOSIS — F411 Generalized anxiety disorder: Secondary | ICD-10-CM

## 2016-03-13 DIAGNOSIS — E559 Vitamin D deficiency, unspecified: Secondary | ICD-10-CM | POA: Diagnosis not present

## 2016-03-13 DIAGNOSIS — M8589 Other specified disorders of bone density and structure, multiple sites: Secondary | ICD-10-CM | POA: Diagnosis not present

## 2016-03-13 DIAGNOSIS — K5900 Constipation, unspecified: Secondary | ICD-10-CM

## 2016-03-13 DIAGNOSIS — R6889 Other general symptoms and signs: Secondary | ICD-10-CM

## 2016-03-13 DIAGNOSIS — R69 Illness, unspecified: Secondary | ICD-10-CM | POA: Diagnosis not present

## 2016-03-13 DIAGNOSIS — G8929 Other chronic pain: Secondary | ICD-10-CM

## 2016-03-13 DIAGNOSIS — M5442 Lumbago with sciatica, left side: Secondary | ICD-10-CM | POA: Diagnosis not present

## 2016-03-13 DIAGNOSIS — R609 Edema, unspecified: Secondary | ICD-10-CM | POA: Diagnosis not present

## 2016-03-13 LAB — VERITOR FLU A/B WAIVED
INFLUENZA A: NEGATIVE
Influenza B: NEGATIVE

## 2016-03-13 MED ORDER — MELOXICAM 7.5 MG PO TABS: ORAL_TABLET | ORAL | 1 refills | Status: DC

## 2016-03-13 MED ORDER — OSELTAMIVIR PHOSPHATE 75 MG PO CAPS: 75.0000 mg | ORAL_CAPSULE | Freq: Two times a day (BID) | ORAL | 0 refills | Status: DC

## 2016-03-13 NOTE — Patient Instructions (Signed)

## 2016-03-13 NOTE — Progress Notes (Signed)
Subjective:    Patient ID: Molly Castaneda, female    DOB: 05-04-1960, 56 y.o.   MRN: [id number]  Pt presents to the office today for medication refill and lab work. Pt is followed by pain clinic for chronic low back pain with left leg pain.  Anxiety  Presents for follow-up visit. Symptoms include excessive worry and nervous/anxious behavior. Patient reports no decreased concentration, hyperventilation, insomnia, irritability, nausea, palpitations, panic or shortness of breath. Symptoms occur occasionally. The severity of symptoms is moderate.    Constipation  This is a chronic problem. The current episode started more than 1 year ago. The problem has been waxing and waning since onset. Her stool frequency is 2 to 3 times per week. The patient is on a high fiber diet. There has been adequate water intake. Associated symptoms include back pain. Pertinent negatives include no diarrhea, fever, nausea or weight loss. She has tried diet changes and stool softeners for the symptoms. The treatment provided mild relief.  Cough  This is a new problem. The current episode started in the past 7 days. The problem has been gradually worsening. The problem occurs every few minutes. The cough is non-productive. Associated symptoms include chills, myalgias and a sore throat. Pertinent negatives include no ear congestion, ear pain, fever, headaches, rhinorrhea, shortness of breath or weight loss. The symptoms are aggravated by lying down. She has tried rest and OTC cough suppressant for the symptoms. The treatment provided no relief.  Edema PT states she has swelling in bilateral legs, breasts, and arms. Pt states it is "all over".  Pt states she takes lasix daily. Osteopenia Pt current taking Calcium and Vit D daily. Last Bone Density 09/07/15.   Review of Systems  Constitutional: Positive for chills. Negative for fever, irritability and weight loss.  HENT: Positive for sore throat. Negative for ear pain and  rhinorrhea.   Eyes: Negative.   Respiratory: Positive for cough. Negative for shortness of breath.   Cardiovascular: Negative.  Negative for palpitations.  Gastrointestinal: Positive for constipation. Negative for diarrhea and nausea.  Endocrine: Negative.   Genitourinary: Negative.   Musculoskeletal: Positive for back pain and myalgias.  Neurological: Negative.  Negative for headaches.  Hematological: Negative.   Psychiatric/Behavioral: Negative for decreased concentration. The patient is nervous/anxious. The patient does not have insomnia.   All other systems reviewed and are negative.      Objective:   Physical Exam  Constitutional: She is oriented to person, place, and time. She appears well-developed and well-nourished. No distress.  HENT:  Head: Normocephalic and atraumatic.  Right Ear: External ear normal.  Left Ear: External ear normal.  Nose: Mucosal edema and rhinorrhea present.  Mouth/Throat: Posterior oropharyngeal erythema present.  Eyes: Pupils are equal, round, and reactive to light.  Neck: Normal range of motion. Neck supple. No thyromegaly present.  Cardiovascular: Normal rate, regular rhythm, normal heart sounds and intact distal pulses.   No murmur heard. Pulmonary/Chest: Effort normal and breath sounds normal. No respiratory distress. She has no wheezes.  Abdominal: Soft. Bowel sounds are normal. She exhibits no distension. There is no tenderness.  Musculoskeletal: Normal range of motion. She exhibits edema (trace amt in bilateral LE). She exhibits no tenderness.  Neurological: She is alert and oriented to person, place, and time. She has normal reflexes. No cranial nerve deficit.  Skin: Skin is warm and dry.  Psychiatric: She has a normal mood and affect. Her behavior is normal. Judgment and thought content normal.  Vitals reviewed.  BP 118/72   Pulse 96   Temp 98 F (36.7 C) (Oral)   Ht 5' 8"  (1.727 m)   Wt 202 lb (91.6 kg)   BMI 30.71 kg/m         Assessment & Plan:  1. Osteopenia of multiple sites - CMP14+EGFR  2. Constipation, unspecified constipation type - CMP14+EGFR  3. Vitamin D deficiency  - CMP14+EGFR - VITAMIN D 25 Hydroxy (Vit-D Deficiency, Fractures)  4. Chronic left-sided low back pain with left-sided sciatica  - CMP14+EGFR  5. Moderate episode of recurrent major depressive disorder (HCC) - CMP14+EGFR  6. GAD (generalized anxiety disorder)  - CMP14+EGFR  7. Peripheral edema  - CMP14+EGFR  8. Flu-like symptoms  - Veritor Flu A/B Waived - CMP14+EGFR - oseltamivir (TAMIFLU) 75 MG capsule; Take 1 capsule (75 mg total) by mouth 2 (two) times daily.  Dispense: 10 capsule; Refill: 0   Continue all meds Labs pending Health Maintenance reviewed Diet and exercise encouraged RTO 6 months  Evelina Dun, FNP

## 2016-03-14 LAB — CMP14+EGFR
ALT: 17 IU/L (ref 0–32)
AST: 15 IU/L (ref 0–40)
Albumin/Globulin Ratio: 1.4 (ref 1.2–2.2)
Albumin: 3.9 g/dL (ref 3.5–5.5)
Alkaline Phosphatase: 91 IU/L (ref 39–117)
BUN/Creatinine Ratio: 16 (ref 9–23)
BUN: 13 mg/dL (ref 6–24)
Bilirubin Total: 0.3 mg/dL (ref 0.0–1.2)
CALCIUM: 8.6 mg/dL — AB (ref 8.7–10.2)
CO2: 29 mmol/L (ref 18–29)
Chloride: 96 mmol/L (ref 96–106)
Creatinine, Ser: 0.82 mg/dL (ref 0.57–1.00)
GFR calc Af Amer: 92 mL/min/{1.73_m2} (ref >59)
GFR, EST NON AFRICAN AMERICAN: 80 mL/min/{1.73_m2} (ref >59)
Globulin, Total: 2.7 g/dL (ref 1.5–4.5)
Glucose: 85 mg/dL (ref 65–99)
Potassium: 4.3 mmol/L (ref 3.5–5.2)
Sodium: 140 mmol/L (ref 134–144)
Total Protein: 6.6 g/dL (ref 6.0–8.5)

## 2016-03-14 LAB — VITAMIN D 25 HYDROXY (VIT D DEFICIENCY, FRACTURES): Vit D, 25-Hydroxy: 27 ng/mL — ABNORMAL LOW (ref 30.0–100.0)

## 2016-03-15 ENCOUNTER — Other Ambulatory Visit: Payer: Self-pay | Admitting: Family

## 2016-03-15 MED ORDER — VITAMIN D (ERGOCALCIFEROL) 1.25 MG (50000 UNIT) PO CAPS: 50000.0000 [IU] | ORAL_CAPSULE | ORAL | 3 refills | Status: DC

## 2016-05-24 DIAGNOSIS — R2681 Unsteadiness on feet: Secondary | ICD-10-CM | POA: Diagnosis not present

## 2016-05-24 DIAGNOSIS — R600 Localized edema: Secondary | ICD-10-CM | POA: Diagnosis not present

## 2016-05-24 DIAGNOSIS — M5117 Intervertebral disc disorders with radiculopathy, lumbosacral region: Secondary | ICD-10-CM | POA: Diagnosis not present

## 2016-05-24 DIAGNOSIS — G589 Mononeuropathy, unspecified: Secondary | ICD-10-CM | POA: Diagnosis not present

## 2016-05-24 DIAGNOSIS — R531 Weakness: Secondary | ICD-10-CM | POA: Diagnosis not present

## 2016-05-24 DIAGNOSIS — M5432 Sciatica, left side: Secondary | ICD-10-CM | POA: Diagnosis not present

## 2016-05-24 DIAGNOSIS — Z79899 Other long term (current) drug therapy: Secondary | ICD-10-CM | POA: Diagnosis not present

## 2016-05-24 DIAGNOSIS — G629 Polyneuropathy, unspecified: Secondary | ICD-10-CM | POA: Diagnosis not present

## 2016-05-24 DIAGNOSIS — R200 Anesthesia of skin: Secondary | ICD-10-CM | POA: Diagnosis not present

## 2016-05-24 DIAGNOSIS — G8921 Chronic pain due to trauma: Secondary | ICD-10-CM | POA: Diagnosis not present

## 2016-05-24 DIAGNOSIS — R69 Illness, unspecified: Secondary | ICD-10-CM | POA: Diagnosis not present

## 2016-07-03 ENCOUNTER — Telehealth: Payer: Self-pay | Admitting: Family Medicine

## 2016-07-06 ENCOUNTER — Other Ambulatory Visit: Payer: Self-pay | Admitting: Family

## 2016-07-06 DIAGNOSIS — R609 Edema, unspecified: Secondary | ICD-10-CM

## 2016-07-12 ENCOUNTER — Telehealth: Payer: Self-pay | Admitting: Family Medicine

## 2016-07-12 ENCOUNTER — Encounter: Payer: Self-pay | Admitting: Physician Assistant

## 2016-07-12 ENCOUNTER — Other Ambulatory Visit: Payer: Self-pay | Admitting: Physician Assistant

## 2016-07-12 ENCOUNTER — Ambulatory Visit (INDEPENDENT_AMBULATORY_CARE_PROVIDER_SITE_OTHER): Payer: Medicare HMO | Admitting: Physician Assistant

## 2016-07-12 VITALS — BP 110/63 | HR 96 | Temp 98.2°F | Ht 68.0 in | Wt 201.0 lb

## 2016-07-12 DIAGNOSIS — J069 Acute upper respiratory infection, unspecified: Secondary | ICD-10-CM

## 2016-07-12 DIAGNOSIS — W57XXXA Bitten or stung by nonvenomous insect and other nonvenomous arthropods, initial encounter: Secondary | ICD-10-CM | POA: Diagnosis not present

## 2016-07-12 DIAGNOSIS — S20469A Insect bite (nonvenomous) of unspecified back wall of thorax, initial encounter: Secondary | ICD-10-CM

## 2016-07-12 MED ORDER — BENZONATATE 100 MG PO CAPS: 100.0000 mg | ORAL_CAPSULE | Freq: Three times a day (TID) | ORAL | 0 refills | Status: DC | PRN

## 2016-07-12 MED ORDER — DOXYCYCLINE HYCLATE 100 MG PO TABS: 100.0000 mg | ORAL_TABLET | Freq: Two times a day (BID) | ORAL | 0 refills | Status: DC

## 2016-07-12 NOTE — Patient Instructions (Signed)
Tick Bite Information Introduction Ticks are insects that attach themselves to the skin. There are many types of ticks. Common types include wood ticks and deer ticks. Sometimes, ticks carry diseases that can make a person very ill. The most common places for ticks to attach themselves are the scalp, neck, armpits, waist, and groin. HOW CAN YOU PREVENT TICK BITES? Take these steps to help prevent tick bites when you are outdoors:  Wear long sleeves and long pants.  Wear white clothes so you can see ticks more easily.  Tuck your pant legs into your socks.  If walking on a trail, stay in the middle of the trail to avoid brushing against bushes.  Avoid walking through areas with long grass.  Put bug spray on all skin that is showing and along boot tops, pant legs, and sleeve cuffs.  Check clothes, hair, and skin often and before going inside.  Brush off any ticks that are not attached.  Take a shower or bath as soon as possible after being outdoors.  HOW SHOULD YOU REMOVE A TICK? Ticks should be removed as soon as possible to help prevent diseases. 1. If latex gloves are available, put them on before trying to remove a tick. 2. Use tweezers to grasp the tick as close to the skin as possible. You may also use curved forceps or a tick removal tool. Grasp the tick as close to its head as possible. Avoid grasping the tick on its body. 3. Pull gently upward until the tick lets go. Do not twist the tick or jerk it suddenly. This may break off the tick's head or mouth parts. 4. Do not squeeze or crush the tick's body. This could force disease-carrying fluids from the tick into your body. 5. After the tick is removed, wash the bite area and your hands with soap and water or alcohol. 6. Apply a small amount of antiseptic cream or ointment to the bite site. 7. Wash any tools that were used.  Do not try to remove a tick by applying a hot match, petroleum jelly, or fingernail polish to the tick.  These methods do not work. They may also increase the chances of disease being spread from the tick bite. WHEN SHOULD YOU SEEK HELP? Contact your health care provider if you are unable to remove a tick or if a part of the tick breaks off in the skin. After a tick bite, you need to watch for signs and symptoms of diseases that can be spread by ticks. Contact your health care provider if you develop any of the following:  Fever.  Rash.  Redness and puffiness (swelling) in the area of the tick bite.  Tender, puffy lymph glands.  Watery poop (diarrhea).  Weight loss.  Cough.  Feeling more tired than normal (fatigue).  Muscle, joint, or bone pain.  Belly (abdominal) pain.  Headache.  Change in your level of consciousness.  Trouble walking or moving your legs.  Loss of feeling (numbness) in the legs.  Loss of movement (paralysis).  Shortness of breath.  Confusion.  Throwing up (vomiting) many times.  This information is not intended to replace advice given to you by your health care provider. Make sure you discuss any questions you have with your health care provider. Document Released: 04/18/2009 Document Revised: 06/30/2015 Document Reviewed: 07/02/2012 Elsevier Interactive Patient Education  2018 [employer]  

## 2016-07-12 NOTE — Progress Notes (Signed)
Subjective:     Patient ID: Shannan HarperShelia A Byers, female   DOB: [birth year], 58 y.o.   MRN: [id number]  HPI Pt with a tick bite last week to the L upper back  + pruritus to the area  Now with cough and congestion  Review of Systems  Constitutional: Positive for activity change and fatigue. Negative for chills and fever.  HENT: Positive for congestion, postnasal drip, rhinorrhea and sore throat.   Respiratory: Positive for cough. Negative for shortness of breath and wheezing.   Cardiovascular: Negative.   Hematological: Negative for adenopathy.  + pruritus to bite area on the L upper back     Objective:   Physical Exam  Constitutional: She appears well-developed and well-nourished.  HENT:  Right Ear: External ear normal.  Left Ear: External ear normal.  Mouth/Throat: Oropharynx is clear and moist. No oropharyngeal exudate.  Neck: Neck supple.  Cardiovascular: Normal rate, regular rhythm and normal heart sounds.   No murmur heard. Pulmonary/Chest: Effort normal and breath sounds normal. She has no wheezes.  Lymphadenopathy:    She has cervical adenopathy.       Assessment:     Upper respiratory tract infection, unspecified type  Tick bite, initial encounter      Plan:        Fluids  Rest OTC antihistamines Doxyccyline 100mg  bid x 10 days- sun precaut reviewed F/U prn

## 2016-07-16 ENCOUNTER — Telehealth: Payer: Self-pay | Admitting: Family Medicine

## 2016-07-16 MED ORDER — FLUCONAZOLE 150 MG PO TABS: 150.0000 mg | ORAL_TABLET | Freq: Once | ORAL | 0 refills | Status: AC

## 2016-07-16 NOTE — Telephone Encounter (Signed)
Please advise 

## 2016-07-16 NOTE — Telephone Encounter (Signed)
Pt aware.

## 2016-08-24 DIAGNOSIS — M5432 Sciatica, left side: Secondary | ICD-10-CM | POA: Diagnosis not present

## 2016-08-24 DIAGNOSIS — R69 Illness, unspecified: Secondary | ICD-10-CM | POA: Diagnosis not present

## 2016-08-24 DIAGNOSIS — M545 Low back pain: Secondary | ICD-10-CM | POA: Diagnosis not present

## 2016-09-01 ENCOUNTER — Other Ambulatory Visit: Payer: Self-pay | Admitting: Family

## 2016-09-01 ENCOUNTER — Other Ambulatory Visit: Payer: Self-pay | Admitting: Family Medicine

## 2016-09-01 DIAGNOSIS — R609 Edema, unspecified: Secondary | ICD-10-CM

## 2016-09-04 NOTE — Telephone Encounter (Signed)
Last seen 07/12/16 Molly HoraBill Webster  Do not see this med on her med list    Dr Darlyn ReadStacks PCP

## 2016-09-11 ENCOUNTER — Encounter (HOSPITAL_COMMUNITY): Payer: Self-pay | Admitting: Emergency Medicine

## 2016-09-11 DIAGNOSIS — Y9389 Activity, other specified: Secondary | ICD-10-CM | POA: Insufficient documentation

## 2016-09-11 DIAGNOSIS — Z79899 Other long term (current) drug therapy: Secondary | ICD-10-CM | POA: Insufficient documentation

## 2016-09-11 DIAGNOSIS — R079 Chest pain, unspecified: Secondary | ICD-10-CM | POA: Insufficient documentation

## 2016-09-11 DIAGNOSIS — Z7982 Long term (current) use of aspirin: Secondary | ICD-10-CM | POA: Diagnosis not present

## 2016-09-11 DIAGNOSIS — I10 Essential (primary) hypertension: Secondary | ICD-10-CM | POA: Diagnosis not present

## 2016-09-11 DIAGNOSIS — Y92410 Unspecified street and highway as the place of occurrence of the external cause: Secondary | ICD-10-CM | POA: Insufficient documentation

## 2016-09-11 DIAGNOSIS — Y999 Unspecified external cause status: Secondary | ICD-10-CM | POA: Insufficient documentation

## 2016-09-11 DIAGNOSIS — M79605 Pain in left leg: Secondary | ICD-10-CM | POA: Diagnosis not present

## 2016-09-11 DIAGNOSIS — M542 Cervicalgia: Secondary | ICD-10-CM | POA: Diagnosis present

## 2016-09-11 DIAGNOSIS — S199XXA Unspecified injury of neck, initial encounter: Secondary | ICD-10-CM | POA: Diagnosis not present
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
## 2016-09-12 ENCOUNTER — Emergency Department (HOSPITAL_COMMUNITY)
Admission: EM | Admit: 2016-09-12 | Discharge: 2016-09-12 | Disposition: A | Discharge status: Home / Self Care | Payer: Medicare HMO | Attending: Emergency Medicine | Admitting: Emergency Medicine

## 2016-09-12 ENCOUNTER — Emergency Department (HOSPITAL_COMMUNITY): Payer: Medicare HMO

## 2016-09-12 DIAGNOSIS — Z7982 Long term (current) use of aspirin: Secondary | ICD-10-CM | POA: Diagnosis not present

## 2016-09-12 DIAGNOSIS — M7918 Myalgia, other site: Secondary | ICD-10-CM

## 2016-09-12 DIAGNOSIS — Z79899 Other long term (current) drug therapy: Secondary | ICD-10-CM | POA: Diagnosis not present

## 2016-09-12 DIAGNOSIS — M542 Cervicalgia: Secondary | ICD-10-CM | POA: Diagnosis not present

## 2016-09-12 DIAGNOSIS — S199XXA Unspecified injury of neck, initial encounter: Secondary | ICD-10-CM | POA: Diagnosis not present

## 2016-09-12 DIAGNOSIS — I10 Essential (primary) hypertension: Secondary | ICD-10-CM | POA: Diagnosis not present

## 2016-09-12 DIAGNOSIS — R079 Chest pain, unspecified: Secondary | ICD-10-CM | POA: Diagnosis not present

## 2016-09-12 DIAGNOSIS — M5442 Lumbago with sciatica, left side: Secondary | ICD-10-CM

## 2016-09-12 DIAGNOSIS — M79605 Pain in left leg: Secondary | ICD-10-CM | POA: Diagnosis not present

## 2016-09-12 MED ORDER — TIZANIDINE HCL 4 MG PO TABS
4.0000 mg | ORAL_TABLET | Freq: Once | ORAL | Status: AC
  Administered 2016-09-12: 4 mg via ORAL
  Filled 2016-09-12: qty 1

## 2016-09-12 MED ORDER — TIZANIDINE HCL 4 MG PO TABS: 4.0000 mg | ORAL_TABLET | Freq: Every evening | ORAL | 0 refills | Status: DC | PRN

## 2016-09-12 MED ORDER — MORPHINE SULFATE 15 MG PO TABS
15.0000 mg | ORAL_TABLET | ORAL | Status: DC | PRN
  Administered 2016-09-12: 15 mg via ORAL
  Filled 2016-09-12: qty 1
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
## 2016-09-12 NOTE — ED Notes (Signed)
Pt placed in paper scrubs and given warm blankets

## 2016-09-12 NOTE — ED Notes (Signed)
Discharge instructions given.  Verbalizes understanding.

## 2016-09-14 ENCOUNTER — Ambulatory Visit: Payer: Medicare HMO | Admitting: Family

## 2016-09-17 ENCOUNTER — Encounter: Payer: Self-pay | Admitting: Family Medicine

## 2016-09-17 ENCOUNTER — Ambulatory Visit (INDEPENDENT_AMBULATORY_CARE_PROVIDER_SITE_OTHER): Payer: Medicare HMO | Admitting: Family Medicine

## 2016-09-17 ENCOUNTER — Ambulatory Visit (INDEPENDENT_AMBULATORY_CARE_PROVIDER_SITE_OTHER): Payer: Medicare HMO

## 2016-09-17 VITALS — BP 116/73 | HR 87 | Temp 97.9°F | Ht 66.0 in | Wt 199.0 lb

## 2016-09-17 DIAGNOSIS — R0789 Other chest pain: Secondary | ICD-10-CM

## 2016-09-17 DIAGNOSIS — S134XXA Sprain of ligaments of cervical spine, initial encounter: Secondary | ICD-10-CM

## 2016-09-17 MED ORDER — TIZANIDINE HCL 4 MG PO TABS: 4.0000 mg | ORAL_TABLET | Freq: Every evening | ORAL | 1 refills | Status: DC | PRN
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
## 2016-09-28 ENCOUNTER — Ambulatory Visit (INDEPENDENT_AMBULATORY_CARE_PROVIDER_SITE_OTHER): Payer: Medicare HMO

## 2016-09-28 ENCOUNTER — Ambulatory Visit (INDEPENDENT_AMBULATORY_CARE_PROVIDER_SITE_OTHER): Payer: Medicare HMO | Admitting: Physician Assistant

## 2016-09-28 VITALS — BP 122/71 | HR 98 | Temp 97.9°F | Ht 66.0 in | Wt 204.0 lb

## 2016-09-28 DIAGNOSIS — S8991XA Unspecified injury of right lower leg, initial encounter: Secondary | ICD-10-CM

## 2016-09-28 DIAGNOSIS — L03116 Cellulitis of left lower limb: Secondary | ICD-10-CM

## 2016-09-28 DIAGNOSIS — M25561 Pain in right knee: Secondary | ICD-10-CM

## 2016-09-28 MED ORDER — FLUCONAZOLE 150 MG PO TABS: ORAL_TABLET | ORAL | 0 refills | Status: DC

## 2016-09-28 MED ORDER — CEPHALEXIN 500 MG PO CAPS: 500.0000 mg | ORAL_CAPSULE | Freq: Four times a day (QID) | ORAL | 0 refills | Status: DC

## 2016-09-28 NOTE — Patient Instructions (Signed)
Patellar Dislocation and Subluxation, Phase I Rehab After Surgery Ask your health care provider which exercises are safe for you. Do exercises exactly as told by your health care provider and adjust them as directed. It is normal to feel mild stretching, pulling, tightness, or discomfort as you do these exercises, but you should stop right away if you feel sudden pain or your pain gets worse. Do not begin these exercises until told by your health care provider. If told by your health care provider, wear your brace while you do these exercises. Stretching and range of motion exercises These exercises warm up your muscles and joints and improve the movement and flexibility of your knee. These exercises also help to relieve pain and stiffness. Exercise A: Knee extension, passive 1. Sit with your left / right heel propped on a chair, a coffee table, or a footstool. Do not have anything under your knee to support it. 2. Allow your leg muscles to relax, letting gravity straighten out your knee. You should feel a stretch behind your left / right knee. 3. If told by your health care provider, deepen the stretch by placing a __________ weight on your thigh, just above your kneecap. 4. Hold this position for__________ seconds. Repeat __________ times. Complete this stretch __________ times a day. Exercise B: Knee flexion, passive (supine)  1. Start this exercise in one of these positions: ? Lying on the floor in front of an open doorway with your left / right heel and foot lightly touching the wall. ? Lying on your bed with both of your feet on the wall or headboard. 2. Without using any effort, allow gravity to let your foot slide down the wall slowly until you feel a gentle stretch in the front of your left / right knee,or until your knee reaches the angle that your health care provider tells you. 3. Hold this stretch for __________ seconds. 4. Return the leg to the starting position, using your healthy  leg to do the work or to help if needed. Repeat __________ times. Complete this stretch __________ times a day. Strengthening exercises These exercises build strength and endurance in your knee. Endurance is the ability to use your muscles for a long time, even after they get tired. Exercise C: Quadriceps, isometric  1. Lie on your back with your left / right leg extended and your other knee bent. 2. Gradually tense the muscles in the front of your left / right thigh. You should see your kneecap slide up toward your hip or see increased dimpling just above the knee. This motion will push the back of your knee down toward the floor. 3. Hold the muscle as tight as you can without increasing your pain for __________ seconds. 4. Relax the muscles slowly and completely. Repeat __________ times. Complete this exercise __________ times a day. Exercise D: Straight leg raises ( quadriceps) 1. Lie on your back with your left / right leg extended and your other knee bent. 2. Tense the muscles in the front of your left / right thigh. 3. Keep these muscles tight as you raise your leg 4-6 inches (10-15 cm) off the floor. Do not let your knee bend. 4. Hold for __________ seconds. 5. Keep these muscles tense as you lower your leg. 6. Relax the muscles slowly and completely. Repeat __________ times. Complete this exercise __________ times a day. Exercise E: Straight leg raises ( hip abductors) 1. Lie on your side with your left / right leg in the  top position. Lie so your head, shoulder, knee, and hip line up. You may bend your lower knee to help you keep your balance. 2. Lift your top leg 4-6 inches (10-15 cm) while keeping your toes pointed straight ahead. 3. Hold this position for __________ seconds. 4. Slowly lower your leg to the starting position. 5. Let your muscles relax completely. Repeat __________ times. Complete this exercise __________ times a day. Exercise F: Straight leg raises ( hip  extensors) 1. Lie on your abdomen on a firm surface. 2. Tense the muscles in your buttocks and lift your left / right leg about 4 inches (10 cm). Keep your knee straight as you lift your leg. If you cannot lift your leg 4 inches (10 cm) without arching your back, place a pillow under your hips. 3. Hold this position for __________ seconds. 4. Slowly lower your leg to the starting position. 5. Let your leg relax completely. Repeat __________ times. Complete this exercise __________ times a day. This information is not intended to replace advice given to you by your health care provider. Make sure you discuss any questions you have with your health care provider. Document Released: 01/22/2005 Document Revised: 09/29/2015 Document Reviewed: 02/05/2015 Elsevier Interactive Patient Education  [employer].

## 2016-09-29 ENCOUNTER — Encounter: Payer: Self-pay | Admitting: Physician Assistant
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
## 2016-10-16 ENCOUNTER — Ambulatory Visit (INDEPENDENT_AMBULATORY_CARE_PROVIDER_SITE_OTHER): Payer: Medicare HMO | Admitting: Family

## 2016-10-16 ENCOUNTER — Encounter: Payer: Self-pay | Admitting: Family

## 2016-10-16 VITALS — BP 128/79 | HR 80 | Temp 97.1°F | Ht 66.0 in | Wt 203.2 lb

## 2016-10-16 DIAGNOSIS — R609 Edema, unspecified: Secondary | ICD-10-CM | POA: Diagnosis not present

## 2016-10-16 DIAGNOSIS — R69 Illness, unspecified: Secondary | ICD-10-CM | POA: Diagnosis not present

## 2016-10-16 DIAGNOSIS — G8929 Other chronic pain: Secondary | ICD-10-CM

## 2016-10-16 DIAGNOSIS — E559 Vitamin D deficiency, unspecified: Secondary | ICD-10-CM

## 2016-10-16 DIAGNOSIS — F411 Generalized anxiety disorder: Secondary | ICD-10-CM | POA: Diagnosis not present

## 2016-10-16 DIAGNOSIS — M5442 Lumbago with sciatica, left side: Secondary | ICD-10-CM | POA: Diagnosis not present

## 2016-10-16 DIAGNOSIS — K5900 Constipation, unspecified: Secondary | ICD-10-CM

## 2016-10-16 DIAGNOSIS — F331 Major depressive disorder, recurrent, moderate: Secondary | ICD-10-CM | POA: Diagnosis not present

## 2016-10-16 DIAGNOSIS — S81802D Unspecified open wound, left lower leg, subsequent encounter: Secondary | ICD-10-CM | POA: Diagnosis not present

## 2016-10-16 LAB — CMP14+EGFR
ALBUMIN: 4.2 g/dL (ref 3.5–5.5)
ALK PHOS: 90 IU/L (ref 39–117)
ALT: 17 IU/L (ref 0–32)
AST: 21 IU/L (ref 0–40)
Albumin/Globulin Ratio: 1.8 (ref 1.2–2.2)
BUN/Creatinine Ratio: 19 (ref 9–23)
BUN: 13 mg/dL (ref 6–24)
Bilirubin Total: 0.7 mg/dL (ref 0.0–1.2)
CO2: 23 mmol/L (ref 20–29)
CREATININE: 0.69 mg/dL (ref 0.57–1.00)
Calcium: 9.1 mg/dL (ref 8.7–10.2)
Chloride: 102 mmol/L (ref 96–106)
GFR calc Af Amer: 111 mL/min/{1.73_m2} (ref >59)
GFR, EST NON AFRICAN AMERICAN: 96 mL/min/{1.73_m2} (ref >59)
GLOBULIN, TOTAL: 2.4 g/dL (ref 1.5–4.5)
Glucose: 81 mg/dL (ref 65–99)
Potassium: 4.7 mmol/L (ref 3.5–5.2)
Sodium: 140 mmol/L (ref 134–144)
Total Protein: 6.6 g/dL (ref 6.0–8.5)

## 2016-10-16 MED ORDER — MUPIROCIN 2 % EX OINT: 1.0000 "application " | TOPICAL_OINTMENT | Freq: Two times a day (BID) | CUTANEOUS | 0 refills | Status: DC

## 2016-10-16 NOTE — Patient Instructions (Signed)
How to Change Your Dressing A dressing is a material that is placed in and over wounds. A dressing helps your wound to heal by protecting it from bacteria, further injury, and becoming too dry or too wet. What are the risks? The adhesive tape that is used with a dressing may make your skin sore or irritated or cause a rash. These are the most common problems. However, more serious problems can develop, such as:  Bleeding.  Infection.  How to change your dressing How often you change your dressing will depend on your wound. Change the dressing as often as told by your health care provider. Preparing to Change Your Dressing  Take a shower before you do the first dressing change of the day. If your health care provider does not want your wound to get wet and your dressing is not waterproof, you may need to apply plastic leak-proof sealing wrap to your dressing for protection.  If needed, take pain medicine 30 minutes before the dressing change as prescribed by your health care provider.  Set up a clean station for wound care. You will need: ? A disposable garbage bag that is open and ready to use. ? Hand sanitizer. ? Wound cleanser or salt-water solution (saline) as told by your health care provider. ? New dressing material or bandages. Make sure to open the dressing package so the dressing remains on the inside of the package. You may also need the following in your clean station:  A box of vinyl gloves.  Tape.  Skin protectant. This may be a wipe, film, or spray.  Clean or germ-free (sterile) scissors.  A cotton-tipped applicator.  Removing Your Old Dressing  Wash your hands with soap and water. Dry your hands with a clean towel. If soap and water are not available, use hand sanitizer.  If you are using gloves, put the gloves on before you remove the dressing.  Gently remove any adhesive or tape by pulling it off in the direction of your hair growth. Only touch the outside  edges of the dressing.  Take off the dressing. If the dressing sticks to your skin, use a sterile salt-water solution to wet the dressing. This helps it to come off more easily.  Remove any gauze or packing in your wound.  Throw the old dressing supplies into the ready garbage bag.  Remove each glove by grabbing the cuff with the opposite hand and turning the glove inside out. Place the gloves in the trash immediately.  Wash your hands with soap and water. Dry your hands with a clean towel. If soap and water are not available, use hand sanitizer. Cleaning Your Wound  Follow instructions from your health care provider about how to clean your wound. This may include using a saline or recommended wound cleanser.  Do not use over-the-counter medicated or antiseptic creams, sprays, liquids, or dressings unless told to do so by your health care provider.  Use a clean gauze pad to clean the area thoroughly with the recommended saline solution or wound cleanser.  Throw the gauze pad into the garbage bag.  Wash your hands with soap and water. Dry your hands with a clean towel. If soap and water are not available, use hand sanitizer. Applying the Dressing  If your health care provider recommended a skin protectant, apply it to the skin around the wound.  Cover the wound with the recommended dressing, such as a nonstick gauze or bandage. Make sure to touch only the outside   edges of the dressing. Do not touch the inside of the dressing.  Secure the dressing so all sides stay in place. You may do this with the attached medical adhesive, roll gauze, or tape. If you use tape, do not wrap the tape all the way around your arm or leg.  Take off your gloves. Put them in the plastic bag with the old dressing. Tie the bag shut and throw it away.  Wash your hands with soap and water. Dry your hands with a clean towel. If soap and water are not available, use hand sanitizer. Contact a health care provider  if:   You have new pain.  You develop irritation, a rash, or itching around the wound or dressing.  Changing your dressing causes pain or a lot of bleeding. Get help right away if:  You have severe pain.  You have signs of infection, such as: ? More redness, swelling, or pain. ? More fluid or blood. ? Warmth. ? Pus or a bad smell. ? Red streaks leading from wound. ? A fever. This information is not intended to replace advice given to you by your health care provider. Make sure you discuss any questions you have with your health care provider. Document Released: 03/01/2004 Document Revised: 06/22/2015 Document Reviewed: 10/28/2014 Elsevier Interactive Patient Education  2018 [employer]  
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
## 2016-10-17 ENCOUNTER — Other Ambulatory Visit: Payer: Self-pay | Admitting: Family Medicine

## 2016-10-25 ENCOUNTER — Ambulatory Visit (INDEPENDENT_AMBULATORY_CARE_PROVIDER_SITE_OTHER): Payer: Medicare HMO | Admitting: Pediatrics

## 2016-10-25 ENCOUNTER — Telehealth: Payer: Self-pay | Admitting: Family

## 2016-10-25 VITALS — BP 120/82 | HR 86 | Temp 96.5°F | Ht 66.0 in | Wt 203.0 lb

## 2016-10-25 DIAGNOSIS — K5900 Constipation, unspecified: Secondary | ICD-10-CM | POA: Diagnosis not present

## 2016-10-25 DIAGNOSIS — L0390 Cellulitis, unspecified: Secondary | ICD-10-CM | POA: Diagnosis not present

## 2016-10-25 MED ORDER — LINACLOTIDE 145 MCG PO CAPS: 145.0000 ug | ORAL_CAPSULE | Freq: Every day | ORAL | 1 refills | Status: DC

## 2016-10-25 MED ORDER — DOXYCYCLINE HYCLATE 100 MG PO TABS: 100.0000 mg | ORAL_TABLET | Freq: Two times a day (BID) | ORAL | 0 refills | Status: DC

## 2016-10-25 NOTE — Telephone Encounter (Signed)
Pt given an appt this afternoon in afterhours clinic at 6pm.
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
## 2016-11-09 ENCOUNTER — Other Ambulatory Visit: Payer: Self-pay | Admitting: Family Medicine

## 2016-11-09 DIAGNOSIS — R0789 Other chest pain: Secondary | ICD-10-CM

## 2016-11-09 DIAGNOSIS — S134XXA Sprain of ligaments of cervical spine, initial encounter: Secondary | ICD-10-CM

## 2016-11-22 DIAGNOSIS — M545 Low back pain: Secondary | ICD-10-CM | POA: Diagnosis not present

## 2016-11-22 DIAGNOSIS — M79605 Pain in left leg: Secondary | ICD-10-CM | POA: Diagnosis not present

## 2016-11-22 DIAGNOSIS — M25572 Pain in left ankle and joints of left foot: Secondary | ICD-10-CM | POA: Diagnosis not present

## 2016-11-22 DIAGNOSIS — R69 Illness, unspecified: Secondary | ICD-10-CM | POA: Diagnosis not present

## 2016-11-22 DIAGNOSIS — G8921 Chronic pain due to trauma: Secondary | ICD-10-CM | POA: Diagnosis not present

## 2016-11-22 DIAGNOSIS — G8929 Other chronic pain: Secondary | ICD-10-CM | POA: Diagnosis not present

## 2016-11-22 DIAGNOSIS — M5432 Sciatica, left side: Secondary | ICD-10-CM | POA: Diagnosis not present

## 2016-12-03 DIAGNOSIS — R69 Illness, unspecified: Secondary | ICD-10-CM | POA: Diagnosis not present

## 2016-12-11 ENCOUNTER — Other Ambulatory Visit: Payer: Self-pay | Admitting: Family Medicine

## 2016-12-11 DIAGNOSIS — Z1231 Encounter for screening mammogram for malignant neoplasm of breast: Secondary | ICD-10-CM

## 2016-12-14 ENCOUNTER — Ambulatory Visit
Admission: RE | Admit: 2016-12-14 | Discharge: 2016-12-14 | Disposition: A | Discharge status: Home / Self Care | Payer: Medicare HMO | Source: Ambulatory Visit | Attending: Family Medicine | Admitting: Family Medicine

## 2016-12-14 ENCOUNTER — Ambulatory Visit: Payer: Medicare HMO | Admitting: Family

## 2016-12-14 ENCOUNTER — Encounter: Payer: Self-pay | Admitting: Family

## 2016-12-14 VITALS — BP 122/83 | HR 98 | Temp 96.7°F | Ht 66.0 in | Wt 202.0 lb

## 2016-12-14 DIAGNOSIS — S8991XS Unspecified injury of right lower leg, sequela: Secondary | ICD-10-CM

## 2016-12-14 DIAGNOSIS — Z1231 Encounter for screening mammogram for malignant neoplasm of breast: Secondary | ICD-10-CM | POA: Diagnosis not present

## 2016-12-14 DIAGNOSIS — M25561 Pain in right knee: Secondary | ICD-10-CM | POA: Diagnosis not present
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
## 2016-12-14 NOTE — Patient Instructions (Signed)

## 2016-12-20 DIAGNOSIS — Z01419 Encounter for gynecological examination (general) (routine) without abnormal findings: Secondary | ICD-10-CM | POA: Diagnosis not present

## 2016-12-26 DIAGNOSIS — G8929 Other chronic pain: Secondary | ICD-10-CM | POA: Diagnosis not present

## 2016-12-26 DIAGNOSIS — M25561 Pain in right knee: Secondary | ICD-10-CM | POA: Diagnosis not present

## 2016-12-28 ENCOUNTER — Encounter: Payer: Self-pay | Admitting: Family Medicine

## 2016-12-28 ENCOUNTER — Ambulatory Visit: Payer: Medicare HMO | Admitting: Family Medicine

## 2016-12-28 VITALS — Temp 98.7°F | Ht 66.0 in | Wt 206.0 lb

## 2016-12-28 DIAGNOSIS — J029 Acute pharyngitis, unspecified: Secondary | ICD-10-CM | POA: Diagnosis not present

## 2016-12-28 DIAGNOSIS — R6889 Other general symptoms and signs: Secondary | ICD-10-CM

## 2016-12-28 LAB — VERITOR FLU A/B WAIVED
INFLUENZA A: NEGATIVE
Influenza B: NEGATIVE

## 2016-12-28 LAB — RAPID STREP SCREEN (MED CTR MEBANE ONLY): Strep Gp A Ag, IA W/Reflex: POSITIVE — AB

## 2016-12-28 MED ORDER — AMOXICILLIN-POT CLAVULANATE 875-125 MG PO TABS: 1.0000 | ORAL_TABLET | Freq: Two times a day (BID) | ORAL | 0 refills | Status: DC

## 2016-12-28 NOTE — Progress Notes (Addendum)
Patient ID: Molly HarperShelia A Castaneda, female   DOB: [birth year], 58 y.o.   MRN: [id number] Patient here today for sore throat, body aches, cough and congestion that started about 5 days ago.  She has not had any measurable fever.  Cough is nonproductive.  She denies sinus congestion or postnasal drainage.        Patient Active Problem List   Diagnosis Date Noted  . Peripheral edema 09/07/2015  . Vitamin D deficiency 06/22/2014  . Depression 06/21/2014  . GAD (generalized anxiety disorder) 06/21/2014  . Back pain 06/21/2014  . Osteopenia 02/11/2013  . Constipation 05/08/2010   Outpatient Encounter Medications as of 12/28/2016  Medication Sig  . amphetamine-dextroamphetamine (ADDERALL) 10 MG tablet Take 10 mg by mouth 2 (two) times daily with a meal.   . aspirin 81 MG tablet Take 81 mg by mouth daily.  Marland Kitchen. buPROPion (WELLBUTRIN SR) 150 MG 12 hr tablet Take 150 mg by mouth 2 (two) times daily.   . Calcium Carbonate 500 MG CHEW Chew 1 tablet (500 mg total) by mouth daily.  Marland Kitchen. estradiol (ESTRACE) 1 MG tablet Take 1 mg by mouth daily.  . furosemide (LASIX) 40 MG tablet Take 1 Tablet by mouth once daily - May take an additonal tablet once or twice weekly for increased swelling.  . linaclotide (LINZESS) 145 MCG CAPS capsule Take 1 capsule (145 mcg total) by mouth daily before breakfast.  . meloxicam (MOBIC) 7.5 MG tablet Take 1 Tablet by mouth once daily  . morphine (MS CONTIN) 15 MG 12 hr tablet Take 15 mg by mouth 3 (three) times daily.  . Omega 3-6-9 Fatty Acids (OMEGA 3-6-9 COMPLEX) CAPS Take 1 capsule by mouth daily.    Marland Kitchen. tiZANidine (ZANAFLEX) 4 MG tablet Take 1 Tablet by mouth at bedtime as needed FOR MUSCLE SPASMS  . vitamin B-12 (CYANOCOBALAMIN) 1000 MCG tablet Take 1,000 mcg by mouth daily.    . Vitamin D, Cholecalciferol, 1000 units TABS Take 1,000 Units by mouth daily.  . Vitamin D, Ergocalciferol, (DRISDOL) 50000 units CAPS capsule Take 50,000 Units by mouth once a week.  . [DISCONTINUED]  NUCYNTA 75 MG tablet Take 75 mg by mouth every 6 (six) hours as needed. for pain   No facility-administered encounter medications on file as of 12/28/2016.           Temp 98.7 F (37.1 C) (Oral)   Ht 5\' 6"  (1.676 m)   Wt 206 lb (93.4 kg)   BMI 33.25 kg/m   Patient is nontoxic-appearing Throat-2+ injected with tender submandibular nodes. Sinus-nontender to percussion Chest-lungs are clear to percussion and auscultation  RST positive  A,P: Strep throat Treat with  Amoxiciliin x 10 days

## 2017-02-06 DIAGNOSIS — R69 Illness, unspecified: Secondary | ICD-10-CM | POA: Diagnosis not present

## 2017-02-13 DIAGNOSIS — R69 Illness, unspecified: Secondary | ICD-10-CM | POA: Diagnosis not present

## 2017-02-14 ENCOUNTER — Ambulatory Visit (INDEPENDENT_AMBULATORY_CARE_PROVIDER_SITE_OTHER): Payer: Medicare HMO | Admitting: Family

## 2017-02-14 ENCOUNTER — Encounter: Payer: Self-pay | Admitting: Family

## 2017-02-14 ENCOUNTER — Ambulatory Visit: Payer: Medicare HMO | Admitting: Family

## 2017-02-14 VITALS — BP 117/77 | HR 91 | Temp 97.4°F | Ht 66.0 in | Wt 207.0 lb

## 2017-02-14 DIAGNOSIS — M25561 Pain in right knee: Secondary | ICD-10-CM | POA: Diagnosis not present

## 2017-02-14 MED ORDER — DICLOFENAC SODIUM 1 % TD GEL: 2.0000 g | Freq: Four times a day (QID) | TRANSDERMAL | 6 refills | Status: DC
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
## 2017-02-14 NOTE — Patient Instructions (Signed)

## 2017-02-22 DIAGNOSIS — M545 Low back pain: Secondary | ICD-10-CM | POA: Diagnosis not present

## 2017-02-22 DIAGNOSIS — M25561 Pain in right knee: Secondary | ICD-10-CM | POA: Diagnosis not present

## 2017-02-22 DIAGNOSIS — M5432 Sciatica, left side: Secondary | ICD-10-CM | POA: Diagnosis not present

## 2017-02-22 DIAGNOSIS — G8929 Other chronic pain: Secondary | ICD-10-CM | POA: Diagnosis not present

## 2017-02-22 DIAGNOSIS — R69 Illness, unspecified: Secondary | ICD-10-CM | POA: Diagnosis not present

## 2017-02-22 DIAGNOSIS — G8921 Chronic pain due to trauma: Secondary | ICD-10-CM | POA: Diagnosis not present

## 2017-02-22 DIAGNOSIS — M79605 Pain in left leg: Secondary | ICD-10-CM | POA: Diagnosis not present

## 2017-02-22 DIAGNOSIS — Z79891 Long term (current) use of opiate analgesic: Secondary | ICD-10-CM | POA: Diagnosis not present

## 2017-03-05 DIAGNOSIS — M25561 Pain in right knee: Secondary | ICD-10-CM | POA: Diagnosis not present

## 2017-03-05 DIAGNOSIS — G8929 Other chronic pain: Secondary | ICD-10-CM | POA: Diagnosis not present

## 2017-03-07 DIAGNOSIS — R69 Illness, unspecified: Secondary | ICD-10-CM | POA: Diagnosis not present

## 2017-03-21 ENCOUNTER — Telehealth: Payer: Self-pay | Admitting: Family

## 2017-03-21 DIAGNOSIS — R69 Illness, unspecified: Secondary | ICD-10-CM | POA: Diagnosis not present

## 2017-03-21 DIAGNOSIS — G8929 Other chronic pain: Secondary | ICD-10-CM

## 2017-03-21 DIAGNOSIS — M25561 Pain in right knee: Principal | ICD-10-CM

## 2017-03-22 NOTE — Telephone Encounter (Signed)
Please address

## 2017-03-22 NOTE — Telephone Encounter (Signed)
Physical therapy orders placed.

## 2017-04-02 ENCOUNTER — Other Ambulatory Visit: Payer: Self-pay

## 2017-04-02 ENCOUNTER — Ambulatory Visit: Payer: Medicare HMO | Attending: Family | Admitting: Physical Therapy

## 2017-04-02 ENCOUNTER — Ambulatory Visit (INDEPENDENT_AMBULATORY_CARE_PROVIDER_SITE_OTHER): Payer: Medicare HMO | Admitting: Family

## 2017-04-02 ENCOUNTER — Encounter: Payer: Self-pay | Admitting: Family

## 2017-04-02 VITALS — BP 119/75 | HR 82 | Temp 97.6°F | Ht 66.0 in | Wt 210.6 lb

## 2017-04-02 DIAGNOSIS — G8929 Other chronic pain: Secondary | ICD-10-CM | POA: Diagnosis not present

## 2017-04-02 DIAGNOSIS — R6889 Other general symptoms and signs: Secondary | ICD-10-CM

## 2017-04-02 DIAGNOSIS — M25561 Pain in right knee: Secondary | ICD-10-CM | POA: Diagnosis not present

## 2017-04-02 DIAGNOSIS — J209 Acute bronchitis, unspecified: Secondary | ICD-10-CM | POA: Diagnosis not present

## 2017-04-02 MED ORDER — PREDNISONE 10 MG (21) PO TBPK: ORAL_TABLET | ORAL | 0 refills | Status: DC

## 2017-04-02 MED ORDER — BENZONATATE 200 MG PO CAPS: 200.0000 mg | ORAL_CAPSULE | Freq: Three times a day (TID) | ORAL | 1 refills | Status: DC | PRN
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
## 2017-04-02 NOTE — Patient Instructions (Signed)
   Sinan Tuch, PT, DPT [facility] [address] [place], [place], [address] Phone: [phone]   Fax:  [phone]  

## 2017-04-02 NOTE — Patient Instructions (Signed)

## 2017-04-02 NOTE — Progress Notes (Signed)
   Subjective:    Patient ID: Molly HarperShelia A Castaneda, female    DOB: [birth year], 57 y.o.   MRN: [id number]  Cough  This is a new problem. The current episode started 1 to 4 weeks ago. The problem has been waxing and waning. The problem occurs every few minutes. The cough is non-productive. Associated symptoms include chills, headaches, myalgias, nasal congestion and postnasal drip. Pertinent negatives include no ear congestion, ear pain, fever, sore throat or shortness of breath. She has tried rest and OTC cough suppressant for the symptoms. The treatment provided mild relief.      Review of Systems  Constitutional: Positive for chills. Negative for fever.  HENT: Positive for postnasal drip. Negative for ear pain and sore throat.   Respiratory: Positive for cough. Negative for shortness of breath.   Musculoskeletal: Positive for myalgias.  Neurological: Positive for headaches.  All other systems reviewed and are negative.      Objective:   Physical Exam  Constitutional: She is oriented to person, place, and time. She appears well-developed and well-nourished. No distress.  HENT:  Head: Normocephalic and atraumatic.  Right Ear: External ear normal.  Left Ear: External ear normal.  Nose: Mucosal edema and rhinorrhea present.  Mouth/Throat: Posterior oropharyngeal erythema present.  Eyes: Pupils are equal, round, and reactive to light.  Neck: Normal range of motion. Neck supple. No thyromegaly present.  Cardiovascular: Normal rate, regular rhythm, normal heart sounds and intact distal pulses.  No murmur heard. Pulmonary/Chest: Effort normal and breath sounds normal. No respiratory distress. She has no wheezes.  Constant nonproductive cough  Abdominal: Soft. Bowel sounds are normal. She exhibits no distension. There is no tenderness.  Musculoskeletal: Normal range of motion. She exhibits no edema or tenderness.  Neurological: She is alert and oriented to person, place, and time.  Skin: Skin  is warm and dry.  Psychiatric: She has a normal mood and affect. Her behavior is normal. Judgment and thought content normal.  Vitals reviewed.     BP 119/75   Pulse 82   Temp 97.6 F (36.4 C) (Oral)   Ht 5\' 6"  (1.676 m)   Wt 210 lb 9.6 oz (95.5 kg)   BMI 33.99 kg/m      Assessment & Plan:  1. Flu-like symptoms  2. Acute bronchitis, unspecified organism - Take meds as prescribed - Use a cool mist humidifier  -Use saline nose sprays frequently -Force fluids -For any cough or congestion  Use plain Mucinex- regular strength or max strength is fine -For fever or aces or pains- take tylenol or ibuprofen. -Throat lozenges if help -New toothbrush in 3 days RTO prn or if symptoms do not improve or worsen - predniSONE (STERAPRED UNI-PAK 21 TAB) 10 MG (21) TBPK tablet; Use as directed  Dispense: 21 tablet; Refill: 0 - benzonatate (TESSALON) 200 MG capsule; Take 1 capsule (200 mg total) by mouth 3 (three) times daily as needed.  Dispense: 30 capsule; Refill: 1    [name], FNP

## 2017-04-05 DIAGNOSIS — G8929 Other chronic pain: Secondary | ICD-10-CM | POA: Diagnosis not present

## 2017-04-05 DIAGNOSIS — M25561 Pain in right knee: Secondary | ICD-10-CM | POA: Diagnosis not present

## 2017-04-05 DIAGNOSIS — M1711 Unilateral primary osteoarthritis, right knee: Secondary | ICD-10-CM | POA: Diagnosis not present

## 2017-04-09 ENCOUNTER — Ambulatory Visit: Payer: Medicare HMO | Attending: Family | Admitting: Physical Therapy

## 2017-04-09 DIAGNOSIS — G8929 Other chronic pain: Secondary | ICD-10-CM

## 2017-04-09 DIAGNOSIS — M25561 Pain in right knee: Secondary | ICD-10-CM | POA: Insufficient documentation

## 2017-04-09 NOTE — Therapy (Signed)
[facility] [address] [place], [place], [address] Phone: [phone]   Fax:  [phone]  Physical Therapy Treatment  Patient Details  Name: Molly HarperShelia A Buntin MRN: [id number] Date of Birth: 30-Sep-1960 Referring Provider: Jannifer RodneyHawks, Christy   Encounter Date: 04/09/2017  PT End of Session - 04/09/17 0952    Visit Number  2    Number of Visits  12    Date for PT Re-Evaluation  05/14/17    PT Start Time  0947    PT Stop Time  1045    PT Time Calculation (min)  58 min    Activity Tolerance  Patient tolerated treatment well    Behavior During Therapy  [facility] for tasks assessed/performed       Past Medical History:  Diagnosis Date  . Arthritis   . Chronic back pain   . Chronic constipation   . Depression   . Hypertension   . MVP (mitral valve prolapse)    had ablation 1998  . Numbness in left leg    s/p spinal surgery  . Supraventricular tachycardia (HCC)    ablation 1998  . Vitamin D deficiency     Past Surgical History:  Procedure Laterality Date  . AV NODE ABLATION  1998   for svt  . BACK SURGERY  2001   lumbar fusion  . PARTIAL HYSTERECTOMY  1993  . REPAIR EXTENSOR TENDON Left 03/06/2013   Procedure: LEFT LONG BOUTONNIERE REPAIR;  Surgeon: Wyn Forsterobert V Sypher Jr., MD;  Location: [facility];  Service: Orthopedics;  Laterality: Left;  . SPINE SURGERY    . stimulation system implant  07/31/00   lumbar nerve stimulator-none funtioning now    There were no vitals filed for this visit.  Subjective Assessment - 04/09/17 0950    Subjective  Patient states she received knee injections on Friday, March 1. She states she is unsure of how many they performed but she had 5 band-aids around her knee. Patient states the shots helped but the knee feels stiff.     Limitations  House hold activities    Patient Stated Goals  get down on my knee and have no pain    Currently in Pain?  No/denies    Pain Score  0-No pain          [facility] PT Assessment - 04/09/17 0001      Assessment   Medical Diagnosis  Chronic Right knee pain    Onset Date/Surgical Date  09/11/17                  [facility] Adult PT Treatment/Exercise - 04/09/17 0001      Exercises   Exercises  Knee/Hip      Knee/Hip Exercises: Aerobic   Stationary Bike  Level 1 x10 minutes      Knee/Hip Exercises: Standing   Heel Raises  Both;20 reps followed by toe raises x10    Hip Flexion  AROM;Right;20 reps;Knee bent with UE support      Knee/Hip Exercises: Seated   Long Arc Quad  Right;20 reps;Weights    Long Arc Quad Weight  1 lbs.    Long [employer] Quad Limitations  2 second hold    Hamstring Curl  Strengthening;Right;20 reps red Tband      Knee/Hip Exercises: Supine   Bridges  Strengthening;Both;15 reps      Modalities   Modalities  Vasopneumatic      Vasopneumatic   Number Minutes Vasopneumatic   15 minutes  Vasopnuematic Location   Knee    Vasopneumatic Pressure  Low                  PT Long Term Goals - 04/02/17 1311      PT LONG TERM GOAL #1   Title  Patient will be independent with HEP    Time  6    Period  Weeks    Status  New    Target Date  05/14/17      PT LONG TERM GOAL #2   Title  Patient will improve tandem stance to greater than 10 seconds with distant supervision for improved balance.    Time  6    Period  Weeks    Status  New    Target Date  05/14/17      PT LONG TERM GOAL #3   Title  Patient will kneel for 10 minutes with knee pads with less than 2/10 pain to perform home activities.     Time  6    Period  Weeks    Status  New    Target Date  05/14/17      PT LONG TERM GOAL #4   Title  Patient will improve knee flexion and extension MMT to 5/5 for improved stability during home activities.    Time  6    Period  Weeks    Status  New    Target Date  05/14/17      PT LONG TERM GOAL #5   Title  Patient will improve R hip abduction MMT to 5/5 for improved knee stability during gait  and home activities.    Time  6    Period  Weeks    Status  New            Plan - 04/09/17 1032    Clinical Impression Statement  Patient able to complete treatment with minimal increase of R knee pain. Patient stated it was a discomfort in R knee in middle of knee cap and it felt stiff and tight to move it. Patient required rest breaks secondary to chronic pain of low back and L LE. Patient demonstrated strong carryover of cuing as patient was able to correct form and verbalize how to correct her form when she was not performing exercise correctly. Patient educated to continue HEP to maintain ROM and prevent knee stiffness. Patient also educated knee soreness/stiffness after injection is normal and it takes some time to heal. Pt reported understanding. No adverse effects noted upon removal of vasopneumatic device.    Clinical Presentation  Stable    Clinical Decision Making  Low    Rehab Potential  Good    PT Frequency  2x / week    PT Duration  6 weeks    PT Treatment/Interventions  Cryotherapy;ADLs/Self Care Home Management;Moist Heat;Therapeutic exercise;Therapeutic activities;Balance training;Neuromuscular re-education;Patient/family education;Manual techniques;Passive range of motion    PT Next Visit Plan  Continue knee strengthening exercises to pt tolerance to return to PLOF.    Consulted and Agree with Plan of Care  Patient       Patient will benefit from skilled therapeutic intervention in order to improve the following deficits and impairments:  Pain, Decreased range of motion, Decreased strength  Visit Diagnosis: Chronic pain of right knee     Problem List Patient Active Problem List   Diagnosis Date Noted  . Peripheral edema 09/07/2015  . Vitamin D deficiency 06/22/2014  . Depression 06/21/2014  . GAD (generalized  anxiety disorder) 06/21/2014  . Back pain 06/21/2014  . Osteopenia 02/11/2013  . Constipation 05/08/2010   Guss Bunde, PT, DPT 04/09/2017,  10:47 AM  [facility] [address] [place], [place], [address] Phone: [phone]   Fax:  [phone]  Name: MARIBEL HADLEY MRN: [id number] Date of Birth: 01/13/1961

## 2017-04-10 DIAGNOSIS — R69 Illness, unspecified: Secondary | ICD-10-CM | POA: Diagnosis not present

## 2017-04-12 ENCOUNTER — Ambulatory Visit: Payer: Medicare HMO | Admitting: Physical Therapy

## 2017-04-12 DIAGNOSIS — M25561 Pain in right knee: Secondary | ICD-10-CM | POA: Diagnosis not present

## 2017-04-12 DIAGNOSIS — G8929 Other chronic pain: Secondary | ICD-10-CM | POA: Diagnosis not present

## 2017-04-12 NOTE — Therapy (Signed)
[facility] [address] [place], [place], [address] Phone: [phone]   Fax:  [phone]  Physical Therapy Treatment  Patient Details  Name: Molly Castaneda MRN: [id number] Date of Birth: 1960-04-27 Referring Provider: [name]   Encounter Date: 04/12/2017  PT End of Session - 04/12/17 0818    Visit Number  3    Number of Visits  12    Date for PT Re-Evaluation  05/14/17    PT Start Time  0816    PT Stop Time  0907    PT Time Calculation (min)  51 min    Activity Tolerance  Patient tolerated treatment well    Behavior During Therapy  [facility] for tasks assessed/performed       Past Medical History:  Diagnosis Date  . Arthritis   . Chronic back pain   . Chronic constipation   . Depression   . Hypertension   . MVP (mitral valve prolapse)    had ablation 1998  . Numbness in left leg    s/p spinal surgery  . Supraventricular tachycardia (HCC)    ablation 1998  . Vitamin D deficiency     Past Surgical History:  Procedure Laterality Date  . AV NODE ABLATION  1998   for svt  . BACK SURGERY  2001   lumbar fusion  . PARTIAL HYSTERECTOMY  1993  . REPAIR EXTENSOR TENDON Left 03/06/2013   Procedure: LEFT LONG BOUTONNIERE REPAIR;  Surgeon: Wyn Forster., MD;  Location: [facility];  Service: Orthopedics;  Laterality: Left;  . SPINE SURGERY    . stimulation system implant  07/31/00   lumbar nerve stimulator-none funtioning now    There were no vitals filed for this visit.  Subjective Assessment - 04/12/17 0917    Subjective  Patient reports pain is about a 5/10 today.    Pertinent History  chronic pain, depression, internal stimulator but "dead"    Limitations  House hold activities    Patient Stated Goals  get down on my knee and have no pain    Currently in Pain?  Yes    Pain Score  5     Pain Location  Knee    Pain Orientation  Right    Pain Descriptors / Indicators  Sore    Pain Type  Chronic pain     Pain Onset  More than a month ago         [facility] PT Assessment - 04/12/17 0001      Assessment   Medical Diagnosis  Chronic Right knee pain    Onset Date/Surgical Date  09/11/17                  [facility] Adult PT Treatment/Exercise - 04/12/17 0001      Knee/Hip Exercises: Aerobic   Nustep  Level 2 x10 minutes      Knee/Hip Exercises: Supine   Straight Leg Raises  Right;Strengthening;2 sets;10 reps    Other Supine Knee/Hip Exercises  Supine clamshells red Theraband x20    Other Supine Knee/Hip Exercises  Ball squeeze x20 with 5 second hold      Modalities   Modalities  Vasopneumatic      Vasopneumatic   Number Minutes Vasopneumatic   10 minutes    Vasopnuematic Location   Knee    Vasopneumatic Pressure  Medium      Manual Therapy   Manual Therapy  Soft tissue mobilization;Joint mobilization    Soft tissue  mobilization  STW/M to right knee and patella with patella joint mobs                  PT Long Term Goals - 04/02/17 1311      PT LONG TERM GOAL #1   Title  Patient will be independent with HEP    Time  6    Period  Weeks    Status  New    Target Date  05/14/17      PT LONG TERM GOAL #2   Title  Patient will improve tandem stance to greater than 10 seconds with distant supervision for improved balance.    Time  6    Period  Weeks    Status  New    Target Date  05/14/17      PT LONG TERM GOAL #3   Title  Patient will kneel for 10 minutes with knee pads with less than 2/10 pain to perform home activities.     Time  6    Period  Weeks    Status  New    Target Date  05/14/17      PT LONG TERM GOAL #4   Title  Patient will improve knee flexion and extension MMT to 5/5 for improved stability during home activities.    Time  6    Period  Weeks    Status  New    Target Date  05/14/17      PT LONG TERM GOAL #5   Title  Patient will improve R hip abduction MMT to 5/5 for improved knee stability during gait and home activities.    Time  6     Period  Weeks    Status  New            Plan - 04/12/17 0900    Clinical Impression Statement  Patient was able to complete treatment with minimal knee pain. Patient stated SLR made her "tired" but she was able to complete the set with rest breaks. Patient instructed to continue movement to prevent knee stiffnening and light massage to improve blood flow to patella brusing. Normal response to vasopneumatic device upon removal.    Clinical Presentation  Stable    Clinical Decision Making  Low    Rehab Potential  Good    PT Frequency  2x / week    PT Duration  6 weeks    PT Treatment/Interventions  Cryotherapy;ADLs/Self Care Home Management;Moist Heat;Therapeutic exercise;Therapeutic activities;Balance training;Neuromuscular re-education;Patient/family education;Manual techniques;Passive range of motion    PT Next Visit Plan  Continue knee strengthening exercises to pt tolerance to return to PLOF.    Consulted and Agree with Plan of Care  Patient       Patient will benefit from skilled therapeutic intervention in order to improve the following deficits and impairments:  Pain, Decreased range of motion, Decreased strength  Visit Diagnosis: Chronic pain of right knee     Problem List Patient Active Problem List   Diagnosis Date Noted  . Peripheral edema 09/07/2015  . Vitamin D deficiency 06/22/2014  . Depression 06/21/2014  . GAD (generalized anxiety disorder) 06/21/2014  . Back pain 06/21/2014  . Osteopenia 02/11/2013  . Constipation 05/08/2010   Guss BundeKrystle Michah Minton, PT, DPT 04/12/2017, 9:23 AM  [facility] [address] [place], [place], [address] Phone: [phone]   Fax:  [phone]  Name: Shannan HarperShelia A Cassis MRN: [id number] Date of Birth: 12-14-1960

## 2017-04-17 ENCOUNTER — Ambulatory Visit: Payer: Medicare HMO | Admitting: Physical Therapy

## 2017-04-17 DIAGNOSIS — G8929 Other chronic pain: Secondary | ICD-10-CM | POA: Diagnosis not present

## 2017-04-17 DIAGNOSIS — M25561 Pain in right knee: Principal | ICD-10-CM

## 2017-04-17 NOTE — Therapy (Signed)
[facility] [address] [place], [place], [address] Phone: [phone]   Fax:  [phone]  Physical Therapy Treatment  Patient Details  Name: Molly Castaneda MRN: [id number] Date of Birth: 12/05/1960 Referring Provider: [name]   Encounter Date: 04/17/2017  PT End of Session - 04/17/17 0817    Visit Number  4    Number of Visits  12    Date for PT Re-Evaluation  05/14/17    PT Start Time  0815    PT Stop Time  0854    PT Time Calculation (min)  39 min    Activity Tolerance  Patient tolerated treatment well    Behavior During Therapy  [facility] for tasks assessed/performed       Past Medical History:  Diagnosis Date  . Arthritis   . Chronic back pain   . Chronic constipation   . Depression   . Hypertension   . MVP (mitral valve prolapse)    had ablation 1998  . Numbness in left leg    s/p spinal surgery  . Supraventricular tachycardia (HCC)    ablation 1998  . Vitamin D deficiency     Past Surgical History:  Procedure Laterality Date  . AV NODE ABLATION  1998   for svt  . BACK SURGERY  2001   lumbar fusion  . PARTIAL HYSTERECTOMY  1993  . REPAIR EXTENSOR TENDON Left 03/06/2013   Procedure: LEFT LONG BOUTONNIERE REPAIR;  Surgeon: Wyn Forster., MD;  Location: [facility];  Service: Orthopedics;  Laterality: Left;  . SPINE SURGERY    . stimulation system implant  07/31/00   lumbar nerve stimulator-none funtioning now    There were no vitals filed for this visit.  Subjective Assessment - 04/17/17 0818    Subjective  Patient reports feeling "okay" the knee is about 5/10 in the patella.    Pertinent History  chronic pain, depression, internal stimulator but "dead"    Limitations  House hold activities    Patient Stated Goals  get down on my knee and have no pain    Currently in Pain?  Yes    Pain Score  5     Pain Location  Knee    Pain Orientation  Right    Pain Descriptors / Indicators  Sore     Pain Type  Chronic pain    Pain Onset  More than a month ago         [facility] PT Assessment - 04/17/17 0001      Assessment   Medical Diagnosis  Chronic Right knee pain    Onset Date/Surgical Date  09/11/17                  [facility] Adult PT Treatment/Exercise - 04/17/17 0001      Exercises   Exercises  Knee/Hip      Knee/Hip Exercises: Aerobic   Stationary Bike  L4 x12 minutes      Knee/Hip Exercises: Supine   Bridges  Strengthening;Both;20 reps      Knee/Hip Exercises: Sidelying   Hip ABduction  AROM;Strengthening;Both;2 sets;10 reps only x10 with R LE      Modalities   Modalities  Moist Heat      Moist Heat Therapy   Number Minutes Moist Heat  10 Minutes    Moist Heat Location  Knee      Manual Therapy   Manual Therapy  Soft tissue mobilization;Joint mobilization    Soft  tissue mobilization  STW/M to right knee and patella with patella joint mobs                  PT Long Term Goals - 04/02/17 1311      PT LONG TERM GOAL #1   Title  Patient will be independent with HEP    Time  6    Period  Weeks    Status  New    Target Date  05/14/17      PT LONG TERM GOAL #2   Title  Patient will improve tandem stance to greater than 10 seconds with distant supervision for improved balance.    Time  6    Period  Weeks    Status  New    Target Date  05/14/17      PT LONG TERM GOAL #3   Title  Patient will kneel for 10 minutes with knee pads with less than 2/10 pain to perform home activities.     Time  6    Period  Weeks    Status  New    Target Date  05/14/17      PT LONG TERM GOAL #4   Title  Patient will improve knee flexion and extension MMT to 5/5 for improved stability during home activities.    Time  6    Period  Weeks    Status  New    Target Date  05/14/17      PT LONG TERM GOAL #5   Title  Patient will improve R hip abduction MMT to 5/5 for improved knee stability during gait and home activities.    Time  6    Period  Weeks     Status  New            Plan - 04/17/17 0856    Clinical Impression Statement  Patient noted with increased difficulty to performing TEs secondary to chronic pain. Patient was only able to complete one set of right sidelying hip abduction. Patient noted with decreased pain in knee after STW/M. Patient instructed to use vitamin E or lotion to massage at home. Patient reported understanding.     Clinical Presentation  Stable    Clinical Decision Making  Low    Rehab Potential  Good    PT Frequency  2x / week    PT Duration  6 weeks    PT Treatment/Interventions  Cryotherapy;ADLs/Self Care Home Management;Moist Heat;Therapeutic exercise;Therapeutic activities;Balance training;Neuromuscular re-education;Patient/family education;Manual techniques;Passive range of motion    PT Next Visit Plan  Continue knee strengthening exercises to pt tolerance to return to PLOF.    Consulted and Agree with Plan of Care  Patient       Patient will benefit from skilled therapeutic intervention in order to improve the following deficits and impairments:  Pain, Decreased range of motion, Decreased strength  Visit Diagnosis: Chronic pain of right knee     Problem List Patient Active Problem List   Diagnosis Date Noted  . Peripheral edema 09/07/2015  . Vitamin D deficiency 06/22/2014  . Depression 06/21/2014  . GAD (generalized anxiety disorder) 06/21/2014  . Back pain 06/21/2014  . Osteopenia 02/11/2013  . Constipation 05/08/2010   Guss BundeKrystle Suvan Stcyr, PT, DPT 04/17/2017, 9:24 AM  [facility] [address] [place], [place], [address] Phone: [phone]   Fax:  [phone]  Name: Shannan HarperShelia A Petron MRN: [id number] Date of Birth: 20-Oct-1960

## 2017-04-18 ENCOUNTER — Other Ambulatory Visit: Payer: Self-pay | Admitting: Family Medicine

## 2017-04-18 DIAGNOSIS — L03031 Cellulitis of right toe: Secondary | ICD-10-CM | POA: Diagnosis not present

## 2017-04-18 DIAGNOSIS — M79674 Pain in right toe(s): Secondary | ICD-10-CM | POA: Diagnosis not present

## 2017-04-18 DIAGNOSIS — M79671 Pain in right foot: Secondary | ICD-10-CM | POA: Diagnosis not present

## 2017-04-18 DIAGNOSIS — L600 Ingrowing nail: Secondary | ICD-10-CM | POA: Diagnosis not present

## 2017-04-19 ENCOUNTER — Encounter: Payer: Medicare HMO | Admitting: Physical Therapy

## 2017-04-19 ENCOUNTER — Other Ambulatory Visit: Payer: Self-pay | Admitting: *Deleted

## 2017-04-19 DIAGNOSIS — R609 Edema, unspecified: Secondary | ICD-10-CM

## 2017-04-19 MED ORDER — FUROSEMIDE 40 MG PO TABS: ORAL_TABLET | ORAL | 0 refills | Status: DC

## 2017-04-23 ENCOUNTER — Encounter: Payer: Medicare HMO | Admitting: Physical Therapy

## 2017-04-25 DIAGNOSIS — R69 Illness, unspecified: Secondary | ICD-10-CM | POA: Diagnosis not present

## 2017-04-26 ENCOUNTER — Encounter: Payer: Self-pay | Admitting: *Deleted

## 2017-04-26 ENCOUNTER — Ambulatory Visit: Payer: Medicare HMO | Admitting: *Deleted

## 2017-04-26 DIAGNOSIS — M25561 Pain in right knee: Principal | ICD-10-CM

## 2017-04-26 DIAGNOSIS — G8929 Other chronic pain: Secondary | ICD-10-CM

## 2017-04-26 NOTE — Therapy (Signed)
[facility] [address] [place], [place], [address] Phone: [phone]   Fax:  [phone]  Physical Therapy Treatment  Patient Details  Name: Molly HarperShelia A Polo MRN: [id number] Date of Birth: Feb 25, 1960 Referring Provider: Jannifer RodneyHawks, Christy   Encounter Date: 04/26/2017  PT End of Session - 04/26/17 0936    Visit Number  5    Number of Visits  12    Date for PT Re-Evaluation  05/14/17    PT Start Time  0900    PT Stop Time  0950    PT Time Calculation (min)  50 min       Past Medical History:  Diagnosis Date  . Arthritis   . Chronic back pain   . Chronic constipation   . Depression   . Hypertension   . MVP (mitral valve prolapse)    had ablation 1998  . Numbness in left leg    s/p spinal surgery  . Supraventricular tachycardia (HCC)    ablation 1998  . Vitamin D deficiency     Past Surgical History:  Procedure Laterality Date  . AV NODE ABLATION  1998   for svt  . BACK SURGERY  2001   lumbar fusion  . PARTIAL HYSTERECTOMY  1993  . REPAIR EXTENSOR TENDON Left 03/06/2013   Procedure: LEFT LONG BOUTONNIERE REPAIR;  Surgeon: Wyn Forsterobert V Sypher Jr., MD;  Location: [facility];  Service: Orthopedics;  Laterality: Left;  . SPINE SURGERY    . stimulation system implant  07/31/00   lumbar nerve stimulator-none funtioning now    There were no vitals filed for this visit.  Subjective Assessment - 04/26/17 0809    Subjective  Patient reports feeling "okay" the knee is about 5/10 in the patella.    Pertinent History  chronic pain, depression, internal stimulator but "dead"    Limitations  House hold activities    Patient Stated Goals  get down on my knee and have no pain    Currently in Pain?  Yes    Pain Score  4     Pain Orientation  Right    Pain Descriptors / Indicators  Sore    Pain Type  Chronic pain    Pain Onset  More than a month ago                      [facility] Adult PT Treatment/Exercise -  04/26/17 0001      Exercises   Exercises  Knee/Hip      Knee/Hip Exercises: Aerobic   Stationary Bike  L4 x15 minutes      Knee/Hip Exercises: Standing   Terminal Knee Extension  Strengthening;Right;3 sets;10 reps    Rocker Board  3 minutes calf stretching      Knee/Hip Exercises: Seated   Long Arc Quad  Right;3 sets;Strengthening    Long Arc Quad Weight  2 lbs.      Modalities   Modalities  Moist Heat      Moist Heat Therapy   Number Minutes Moist Heat  10 Minutes    Moist Heat Location  Knee      Manual Therapy   Manual Therapy  Soft tissue mobilization;Joint mobilization    Soft tissue mobilization  STW/M to right knee and patella with patella joint mobs                  PT Long Term Goals - 04/02/17 1311      PT  LONG TERM GOAL #1   Title  Patient will be independent with HEP    Time  6    Period  Weeks    Status  New    Target Date  05/14/17      PT LONG TERM GOAL #2   Title  Patient will improve tandem stance to greater than 10 seconds with distant supervision for improved balance.    Time  6    Period  Weeks    Status  New    Target Date  05/14/17      PT LONG TERM GOAL #3   Title  Patient will kneel for 10 minutes with knee pads with less than 2/10 pain to perform home activities.     Time  6    Period  Weeks    Status  New    Target Date  05/14/17      PT LONG TERM GOAL #4   Title  Patient will improve knee flexion and extension MMT to 5/5 for improved stability during home activities.    Time  6    Period  Weeks    Status  New    Target Date  05/14/17      PT LONG TERM GOAL #5   Title  Patient will improve R hip abduction MMT to 5/5 for improved knee stability during gait and home activities.    Time  6    Period  Weeks    Status  New            Plan - 04/26/17 1610    Clinical Impression Statement  Pt arrived today after having an ingrown toenail cut on on LT foot ,but was able to perform strengthening and therex for RT  knee with minimal c/o pain. Sore during LAQs, but did well.    Clinical Presentation  Stable    Clinical Decision Making  Low    Rehab Potential  Good    PT Frequency  2x / week    PT Duration  6 weeks    PT Treatment/Interventions  Cryotherapy;ADLs/Self Care Home Management;Moist Heat;Therapeutic exercise;Therapeutic activities;Balance training;Neuromuscular re-education;Patient/family education;Manual techniques;Passive range of motion    PT Next Visit Plan  Continue knee strengthening exercises to pt tolerance to return to PLOF.    Consulted and Agree with Plan of Care  Patient       Patient will benefit from skilled therapeutic intervention in order to improve the following deficits and impairments:  Pain, Decreased range of motion, Decreased strength  Visit Diagnosis: Chronic pain of right knee     Problem List Patient Active Problem List   Diagnosis Date Noted  . Peripheral edema 09/07/2015  . Vitamin D deficiency 06/22/2014  . Depression 06/21/2014  . GAD (generalized anxiety disorder) 06/21/2014  . Back pain 06/21/2014  . Osteopenia 02/11/2013  . Constipation 05/08/2010    Joan Herschberger,CHRIS, PTA 04/26/2017, 10:20 AM  [facility] [address] [place], [place], [address] Phone: [phone]   Fax:  [phone]  Name: Molly Castaneda MRN: [id number] Date of Birth: 07-19-1960

## 2017-04-30 ENCOUNTER — Ambulatory Visit: Payer: Medicare HMO | Admitting: Physical Therapy

## 2017-04-30 DIAGNOSIS — M25561 Pain in right knee: Principal | ICD-10-CM

## 2017-04-30 DIAGNOSIS — G8929 Other chronic pain: Secondary | ICD-10-CM

## 2017-04-30 NOTE — Therapy (Signed)
[facility] [address] [place], [place], [address] Phone: [phone]   Fax:  [phone]  Physical Therapy Treatment  Patient Details  Name: Molly HarperShelia A Castaneda MRN: [id number] Date of Birth: 10-23-1960 Referring Provider: Jannifer RodneyHawks, Christy   Encounter Date: 04/30/2017  PT End of Session - 04/30/17 0820    Visit Number  6    Number of Visits  12    Date for PT Re-Evaluation  05/14/17    PT Start Time  0815    PT Stop Time  0911    PT Time Calculation (min)  56 min    Activity Tolerance  Patient tolerated treatment well    Behavior During Therapy  [facility] for tasks assessed/performed       Past Medical History:  Diagnosis Date  . Arthritis   . Chronic back pain   . Chronic constipation   . Depression   . Hypertension   . MVP (mitral valve prolapse)    had ablation 1998  . Numbness in left leg    s/p spinal surgery  . Supraventricular tachycardia (HCC)    ablation 1998  . Vitamin D deficiency     Past Surgical History:  Procedure Laterality Date  . AV NODE ABLATION  1998   for svt  . BACK SURGERY  2001   lumbar fusion  . PARTIAL HYSTERECTOMY  1993  . REPAIR EXTENSOR TENDON Left 03/06/2013   Procedure: LEFT LONG BOUTONNIERE REPAIR;  Surgeon: Wyn Forsterobert V Sypher Jr., MD;  Location: [facility];  Service: Orthopedics;  Laterality: Left;  . SPINE SURGERY    . stimulation system implant  07/31/00   lumbar nerve stimulator-none funtioning now    There were no vitals filed for this visit.  Subjective Assessment - 04/30/17 0820    Subjective  Patient reports the knee is feeling pretty good because she hasn't been on it because of her ingrown toe. She stated last visits exercises felt pretty good.    Pertinent History  chronic pain, depression, internal stimulator but "dead"    Limitations  House hold activities    Patient Stated Goals  get down on my knee and have no pain    Currently in Pain?  Yes    Pain Score  3      Pain Location  Knee    Pain Orientation  Right    Pain Descriptors / Indicators  Sore    Pain Type  Chronic pain    Pain Onset  More than a month ago         [facility] - 04/30/17 0001      Assessment   Medical Diagnosis  Chronic Right knee pain    Onset Date/Surgical Date  09/11/17            No data recorded       [facility] Adult PT Treatment/Exercise - 04/30/17 0001      Knee/Hip Exercises: Aerobic   Nustep  Level 5, x15 minutes      Knee/Hip Exercises: Standing   Terminal Knee Extension  Strengthening;Right;2 sets;10 reps    Rocker Board  3 minutes calf stretching      Knee/Hip Exercises: Seated   Hamstring Curl  Strengthening;Right;20 reps red theraband      Knee/Hip Exercises: Supine   Other Supine Knee/Hip Exercises  Supine clamshells red Theraband x20      Modalities   Modalities  Moist Heat      Moist Heat Therapy  Number Minutes Moist Heat  10 Minutes    Moist Heat Location  Knee      Manual Therapy   Manual Therapy  Soft tissue mobilization;Joint mobilization    Soft tissue mobilization  STW/M to right knee and patella with patella joint mobs                  PT Long Term Goals - 04/02/17 1311      PT LONG TERM GOAL #1   Title  Patient will be independent with HEP    Time  6    Period  Weeks    Status  New    Target Date  05/14/17      PT LONG TERM GOAL #2   Title  Patient will improve tandem stance to greater than 10 seconds with distant supervision for improved balance.    Time  6    Period  Weeks    Status  New    Target Date  05/14/17      PT LONG TERM GOAL #3   Title  Patient will kneel for 10 minutes with knee pads with less than 2/10 pain to perform home activities.     Time  6    Period  Weeks    Status  New    Target Date  05/14/17      PT LONG TERM GOAL #4   Title  Patient will improve knee flexion and extension MMT to 5/5 for improved stability during home activities.    Time  6    Period  Weeks     Status  New    Target Date  05/14/17      PT LONG TERM GOAL #5   Title  Patient will improve R hip abduction MMT to 5/5 for improved knee stability during gait and home activities.    Time  6    Period  Weeks    Status  New            Plan - 04/30/17 0911    Clinical Impression Statement  Patient was able to tolerate treatment well. She reported crepitus during nustep but stated she did not feel any increase of pain. Patient continues to have tenderness along patella during STW/M. Normal response to modalities upon removal    Clinical Presentation  Stable    Clinical Decision Making  Low    Rehab Potential  Good    PT Frequency  2x / week    PT Duration  6 weeks    PT Treatment/Interventions  Cryotherapy;ADLs/Self Care Home Management;Moist Heat;Therapeutic exercise;Therapeutic activities;Balance training;Neuromuscular re-education;Patient/family education;Manual techniques;Passive range of motion    PT Next Visit Plan  Continue knee strengthening exercises to pt tolerance to return to PLOF.    Consulted and Agree with Plan of Care  Patient       Patient will benefit from skilled therapeutic intervention in order to improve the following deficits and impairments:  Pain, Decreased range of motion, Decreased strength  Visit Diagnosis: Chronic pain of right knee     Problem List Patient Active Problem List   Diagnosis Date Noted  . Peripheral edema 09/07/2015  . Vitamin D deficiency 06/22/2014  . Depression 06/21/2014  . GAD (generalized anxiety disorder) 06/21/2014  . Back pain 06/21/2014  . Osteopenia 02/11/2013  . Constipation 05/08/2010   Guss Bunde, PT, DPT 04/30/2017, 9:24 AM  [facility] [address] [place], [place], [address] Phone: [phone]   Fax:  [phone]  Name: Molly Castaneda MRN: [id number] Date of Birth: 1960/08/20

## 2017-05-02 DIAGNOSIS — L600 Ingrowing nail: Secondary | ICD-10-CM | POA: Diagnosis not present

## 2017-05-02 DIAGNOSIS — M79671 Pain in right foot: Secondary | ICD-10-CM | POA: Diagnosis not present

## 2017-05-02 DIAGNOSIS — M79674 Pain in right toe(s): Secondary | ICD-10-CM | POA: Diagnosis not present

## 2017-05-02 DIAGNOSIS — L03031 Cellulitis of right toe: Secondary | ICD-10-CM | POA: Diagnosis not present

## 2017-05-03 ENCOUNTER — Ambulatory Visit: Payer: Medicare HMO | Admitting: Physical Therapy

## 2017-05-03 ENCOUNTER — Encounter: Payer: Self-pay | Admitting: Physical Therapy

## 2017-05-03 DIAGNOSIS — G8929 Other chronic pain: Secondary | ICD-10-CM

## 2017-05-03 DIAGNOSIS — M25561 Pain in right knee: Principal | ICD-10-CM

## 2017-05-03 NOTE — Therapy (Signed)
[facility] [address] [place], [place], [address] Phone: [phone]   Fax:  [phone]  Physical Therapy Treatment  Patient Details  Name: Molly Castaneda MRN: [id number] Date of Birth: [birth year] Referring Provider: [name]   Encounter Date: 05/03/2017  PT End of Session - 05/03/17 0820    Visit Number  7    Number of Visits  12    Date for PT Re-Evaluation  05/14/17    PT Start Time  0818    PT Stop Time  0900 2 units secondary to only moist heat used    PT Time Calculation (min)  42 min    Activity Tolerance  Patient tolerated treatment well    Behavior During Therapy  [facility] for tasks assessed/performed       Past Medical History:  Diagnosis Date  . Arthritis   . Chronic back pain   . Chronic constipation   . Depression   . Hypertension   . MVP (mitral valve prolapse)    had ablation 1998  . Numbness in left leg    s/p spinal surgery  . Supraventricular tachycardia (HCC)    ablation 1998  . Vitamin D deficiency     Past Surgical History:  Procedure Laterality Date  . AV NODE ABLATION  1998   for svt  . BACK SURGERY  2001   lumbar fusion  . PARTIAL HYSTERECTOMY  1993  . REPAIR EXTENSOR TENDON Left 03/06/2013   Procedure: LEFT LONG BOUTONNIERE REPAIR;  Surgeon: Wyn Forster., MD;  Location: [facility];  Service: Orthopedics;  Laterality: Left;  . SPINE SURGERY    . stimulation system implant  07/31/00   lumbar nerve stimulator-none funtioning now    There were no vitals filed for this visit.  Subjective Assessment - 05/03/17 0818    Subjective  Reports that her knee is pretty good today and hasn't tried to bend on her knee due to her foot.    Pertinent History  chronic pain, depression, internal stimulator but "dead"    Limitations  House hold activities    Patient Stated Goals  get down on my knee and have no pain    Currently in Pain?  Yes    Pain Score  2     Pain Location  Knee     Pain Orientation  Right    Pain Descriptors / Indicators  Discomfort    Pain Type  Chronic pain    Pain Onset  More than a month ago         [place] PT Assessment - 05/03/17 0001      Assessment   Medical Diagnosis  Chronic Right knee pain    Onset Date/Surgical Date  09/11/16    Next MD Visit  05/14/2017    Prior Therapy  no      Precautions   Precautions  None            No data recorded       [facility] Adult PT Treatment/Exercise - 05/03/17 0001      Knee/Hip Exercises: Aerobic   Nustep  Level 3 x15 minutes      Knee/Hip Exercises: Standing   Terminal Knee Extension  Strengthening;Right;2 sets;10 reps;Theraband    Theraband Level (Terminal Knee Extension)  Level 2 (Red)    Other Standing Knee Exercises  UE slides with GS for quad activation x20 reps      Knee/Hip Exercises: Supine   Short Arc  Quad Sets  AROM;Right;20 reps    Straight Leg Raises  Strengthening;Right;10 reps    Other Supine Knee/Hip Exercises  Supine clamshells red Theraband x20      Modalities   Modalities  Moist Heat      Moist Heat Therapy   Number Minutes Moist Heat  10 Minutes    Moist Heat Location  Knee                  PT Long Term Goals - 04/02/17 1311      PT LONG TERM GOAL #1   Title  Patient will be independent with HEP    Time  6    Period  Weeks    Status  New    Target Date  05/14/17      PT LONG TERM GOAL #2   Title  Patient will improve tandem stance to greater than 10 seconds with distant supervision for improved balance.    Time  6    Period  Weeks    Status  New    Target Date  05/14/17      PT LONG TERM GOAL #3   Title  Patient will kneel for 10 minutes with knee pads with less than 2/10 pain to perform home activities.     Time  6    Period  Weeks    Status  New    Target Date  05/14/17      PT LONG TERM GOAL #4   Title  Patient will improve knee flexion and extension MMT to 5/5 for improved stability during home activities.    Time  6     Period  Weeks    Status  New    Target Date  05/14/17      PT LONG TERM GOAL #5   Title  Patient will improve R hip abduction MMT to 5/5 for improved knee stability during gait and home activities.    Time  6    Period  Weeks    Status  New            Plan - 05/03/17 1610    Clinical Impression Statement  Patient tolerated today's treatment fairly well with intermittant complaints of discomfort with PT treatment. Patient reported low back discomfort with standing UE slides with GS for quad activation as well as one instance of R knee popping during TKE. Light strengthening exercises completed in supine as well without complaints. Moist heat applied per patient preference. Patient reported intermittant R patellar tendon region discomfort upon end of treatment.    Rehab Potential  Good    PT Frequency  2x / week    PT Duration  6 weeks    PT Treatment/Interventions  Cryotherapy;ADLs/Self Care Home Management;Moist Heat;Therapeutic exercise;Therapeutic activities;Balance training;Neuromuscular re-education;Patient/family education;Manual techniques;Passive range of motion    PT Next Visit Plan  Continue knee strengthening exercises to pt tolerance to return to PLOF.    Consulted and Agree with Plan of Care  Patient       Patient will benefit from skilled therapeutic intervention in order to improve the following deficits and impairments:  Pain, Decreased range of motion, Decreased strength  Visit Diagnosis: Chronic pain of right knee     Problem List Patient Active Problem List   Diagnosis Date Noted  . Peripheral edema 09/07/2015  . Vitamin D deficiency 06/22/2014  . Depression 06/21/2014  . GAD (generalized anxiety disorder) 06/21/2014  . Back pain 06/21/2014  . Osteopenia 02/11/2013  .  Constipation 05/08/2010    Marvell FullerKelsey P Raidyn Breiner, PTA 05/03/2017, 9:19 AM  [facility] [address] [place], [place], [address] Phone:  [phone]   Fax:  [phone]  Name: Molly Castaneda MRN: [id number] Date of Birth: 1960/12/26

## 2017-05-07 ENCOUNTER — Ambulatory Visit: Payer: Medicare HMO | Attending: Family | Admitting: *Deleted

## 2017-05-07 DIAGNOSIS — M25561 Pain in right knee: Secondary | ICD-10-CM | POA: Diagnosis not present

## 2017-05-07 DIAGNOSIS — G8929 Other chronic pain: Secondary | ICD-10-CM | POA: Diagnosis not present

## 2017-05-07 NOTE — Therapy (Signed)
[facility] [address] [place], [place], [address] Phone: [phone]   Fax:  [phone]  Physical Therapy Treatment  Patient Details  Name: Molly Castaneda MRN: [id number] Date of Birth: 1960/12/08 Referring Provider: [name]   Encounter Date: 05/07/2017  PT End of Session - 05/07/17 0827    Visit Number  8    Number of Visits  12    Date for PT Re-Evaluation  05/14/17    PT Start Time  0815    PT Stop Time  0906    PT Time Calculation (min)  51 min       Past Medical History:  Diagnosis Date  . Arthritis   . Chronic back pain   . Chronic constipation   . Depression   . Hypertension   . MVP (mitral valve prolapse)    had ablation 1998  . Numbness in left leg    s/p spinal surgery  . Supraventricular tachycardia (HCC)    ablation 1998  . Vitamin D deficiency     Past Surgical History:  Procedure Laterality Date  . AV NODE ABLATION  1998   for svt  . BACK SURGERY  2001   lumbar fusion  . PARTIAL HYSTERECTOMY  1993  . REPAIR EXTENSOR TENDON Left 03/06/2013   Procedure: LEFT LONG BOUTONNIERE REPAIR;  Surgeon: Wyn Forster., MD;  Location: [facility];  Service: Orthopedics;  Laterality: Left;  . SPINE SURGERY    . stimulation system implant  07/31/00   lumbar nerve stimulator-none funtioning now    There were no vitals filed for this visit.  Subjective Assessment - 05/07/17 0813    Subjective  Reports that her knee is pretty good today, but has some burning and stinging    Pertinent History  chronic pain, depression, internal stimulator but "dead"    Limitations  House hold activities    Patient Stated Goals  get down on my knee and have no pain    Currently in Pain?  Yes    Pain Score  2     Pain Location  Knee    Pain Orientation  Right    Pain Descriptors / Indicators  Burning    Pain Type  Chronic pain    Pain Onset  More than a month ago    Pain Frequency  Intermittent                        [facility] Adult PT Treatment/Exercise - 05/07/17 0001      Knee/Hip Exercises: Aerobic   Nustep  Level 3 x20 minutes      Knee/Hip Exercises: Standing   Terminal Knee Extension  Strengthening;Right;10 reps;Theraband;3 sets    Theraband Level (Terminal Knee Extension)  -- XTS pink    Rocker Board  3 minutes calf stretching      Knee/Hip Exercises: Supine   Short Arc Quad Sets  AROM;Right;3 sets;10 reps 3#    Straight Leg Raises  Strengthening;Right;10 reps    Other Supine Knee/Hip Exercises  Supine clamshells red Theraband x20      Modalities   Modalities  Moist Heat      Moist Heat Therapy   Number Minutes Moist Heat  10 Minutes    Moist Heat Location  Knee                  PT Long Term Goals - 04/02/17 1311      PT  LONG TERM GOAL #1   Title  Patient will be independent with HEP    Time  6    Period  Weeks    Status  New    Target Date  05/14/17      PT LONG TERM GOAL #2   Title  Patient will improve tandem stance to greater than 10 seconds with distant supervision for improved balance.    Time  6    Period  Weeks    Status  New    Target Date  05/14/17      PT LONG TERM GOAL #3   Title  Patient will kneel for 10 minutes with knee pads with less than 2/10 pain to perform home activities.     Time  6    Period  Weeks    Status  New    Target Date  05/14/17      PT LONG TERM GOAL #4   Title  Patient will improve knee flexion and extension MMT to 5/5 for improved stability during home activities.    Time  6    Period  Weeks    Status  New    Target Date  05/14/17      PT LONG TERM GOAL #5   Title  Patient will improve R hip abduction MMT to 5/5 for improved knee stability during gait and home activities.    Time  6    Period  Weeks    Status  New            Plan - 05/07/17 [id number]    Clinical Impression Statement  Pt arrived today doing fairly well with RT knee. Pain today was mainlt a stinging and burning pain  front of knee. She was able to perform all therex today without complaints with focus on quad activation and control. HMP at end to 10 mins    Clinical Presentation  Stable    Rehab Potential  Good    PT Frequency  2x / week    PT Duration  6 weeks    PT Treatment/Interventions  Cryotherapy;ADLs/Self Care Home Management;Moist Heat;Therapeutic exercise;Therapeutic activities;Balance training;Neuromuscular re-education;Patient/family education;Manual techniques;Passive range of motion    PT Next Visit Plan  Continue knee strengthening exercises to pt tolerance to return to PLOF.    Consulted and Agree with Plan of Care  Patient       Patient will benefit from skilled therapeutic intervention in order to improve the following deficits and impairments:  Pain, Decreased range of motion, Decreased strength  Visit Diagnosis: Chronic pain of right knee     Problem List Patient Active Problem List   Diagnosis Date Noted  . Peripheral edema 09/07/2015  . Vitamin D deficiency 06/22/2014  . Depression 06/21/2014  . GAD (generalized anxiety disorder) 06/21/2014  . Back pain 06/21/2014  . Osteopenia 02/11/2013  . Constipation 05/08/2010    Delsin Copen,CHRIS, PTA 05/07/2017, 10:01 AM  [facility] [address] [place], [place], [address] Phone: [phone]   Fax:  [phone]  Name: Molly Castaneda MRN: [id number] Date of Birth: 09-Jul-1960

## 2017-05-09 ENCOUNTER — Ambulatory Visit: Payer: Medicare HMO | Admitting: Physical Therapy

## 2017-05-09 DIAGNOSIS — M25561 Pain in right knee: Principal | ICD-10-CM

## 2017-05-09 DIAGNOSIS — G8929 Other chronic pain: Secondary | ICD-10-CM

## 2017-05-09 NOTE — Therapy (Signed)
[facility] [address] [place], [place], [address] Phone: [phone]   Fax:  [phone]  Physical Therapy Treatment  Patient Details  Name: Molly Castaneda MRN: [id number] Date of Birth: 05-15-1960 Referring Provider: [name]   Encounter Date: 05/09/2017  PT End of Session - 05/09/17 0817    Visit Number  9    Number of Visits  12    Date for PT Re-Evaluation  05/14/17    PT Start Time  0817    PT Stop Time  0909    PT Time Calculation (min)  52 min    Activity Tolerance  Patient tolerated treatment well    Behavior During Therapy  [facility] for tasks assessed/performed       Past Medical History:  Diagnosis Date  . Arthritis   . Chronic back pain   . Chronic constipation   . Depression   . Hypertension   . MVP (mitral valve prolapse)    had ablation 1998  . Numbness in left leg    s/p spinal surgery  . Supraventricular tachycardia (HCC)    ablation 1998  . Vitamin D deficiency     Past Surgical History:  Procedure Laterality Date  . AV NODE ABLATION  1998   for svt  . BACK SURGERY  2001   lumbar fusion  . PARTIAL HYSTERECTOMY  1993  . REPAIR EXTENSOR TENDON Left 03/06/2013   Procedure: LEFT LONG BOUTONNIERE REPAIR;  Surgeon: Wyn Forster., MD;  Location: [facility];  Service: Orthopedics;  Laterality: Left;  . SPINE SURGERY    . stimulation system implant  07/31/00   lumbar nerve stimulator-none funtioning now    There were no vitals filed for this visit.  Subjective Assessment - 05/09/17 0819    Subjective  Patient reported feeling okay. When asked how her pain was, she reported "good."     Pertinent History  chronic pain, depression, internal stimulator but "dead"    Limitations  House hold activities    Patient Stated Goals  get down on my knee and have no pain         [facility] PT Assessment - 05/09/17 0001      Assessment   Medical Diagnosis  Chronic Right knee pain    Onset  Date/Surgical Date  09/11/16    Next MD Visit  05/14/2017    Prior Therapy  no      Precautions   Precautions  None                   [facility] Adult PT Treatment/Exercise - 05/09/17 0001      Knee/Hip Exercises: Aerobic   Nustep  Level 4 x15 minutes      Knee/Hip Exercises: Standing   Forward Lunges  Both;2 sets;10 reps mini lunges f/b deep lunges, knee onto airex on 4"    Terminal Knee Extension  Strengthening;Right;3 sets;10 reps    Theraband Level (Terminal Knee Extension)  -- Pink XTS    Hip Abduction  Stengthening;Both;2 sets;10 reps;Knee straight red theraband    Abduction Limitations  red TB    Forward Step Up  Both;10 reps;Hand Hold: 2;Step Height: 6"    Rocker Board  3 minutes      Modalities   Modalities  Moist Heat      Moist Heat Therapy   Number Minutes Moist Heat  10 Minutes    Moist Heat Location  Knee  PT Long Term Goals - 05/09/17 [id number]      PT LONG TERM GOAL #1   Title  Patient will be independent with HEP    Period  Weeks    Status  Achieved      PT LONG TERM GOAL #2   Title  Patient will improve tandem stance to greater than 10 seconds with distant supervision for improved balance.    Time  6    Period  Weeks    Status  Achieved      PT LONG TERM GOAL #3   Title  Patient will kneel for 10 minutes with knee pads with less than 2/10 pain to perform home activities.     Time  6    Period  Weeks    Status  On-going      PT LONG TERM GOAL #4   Title  Patient will improve knee flexion and extension MMT to 5/5 for improved stability during home activities.    Time  6    Period  Weeks    Status  On-going      PT LONG TERM GOAL #5   Title  Patient will improve R hip abduction MMT to 5/5 for improved knee stability during gait and home activities.    Time  6    Period  Weeks    Status  On-going            Plan - 05/09/17 1214    Clinical Impression Statement  Patient was able to perform exercises with  minimal right knee discomfort. Patient was able to kneel x10 with minimal knee pain and minimal foot/toe pain secondary to ingrown toe nail procedure. Patient instructed to buy knee pads tocushion her knee when she kneels in her kitchen. Patient reported agreement. Normal response to moist heat at end of session.     Clinical Presentation  Stable    Clinical Decision Making  Low    Rehab Potential  Good    PT Frequency  2x / week    PT Duration  6 weeks    PT Treatment/Interventions  Cryotherapy;ADLs/Self Care Home Management;Moist Heat;Therapeutic exercise;Therapeutic activities;Balance training;Neuromuscular re-education;Patient/family education;Manual techniques;Passive range of motion    PT Next Visit Plan  Assess LTG #3-5. Continue kneeling on airex. Continue knee strengthening exercises to pt tolerance to return to PLOF.    Consulted and Agree with Plan of Care  Patient       Patient will benefit from skilled therapeutic intervention in order to improve the following deficits and impairments:     Visit Diagnosis: Chronic pain of right knee     Problem List Patient Active Problem List   Diagnosis Date Noted  . Peripheral edema 09/07/2015  . Vitamin D deficiency 06/22/2014  . Depression 06/21/2014  . GAD (generalized anxiety disorder) 06/21/2014  . Back pain 06/21/2014  . Osteopenia 02/11/2013  . Constipation 05/08/2010    Guss BundeKrystle Dessire Grimes, PT, DPT 05/09/2017, 12:28 PM  [facility] [address] [place], [place], [address] Phone: [phone]   Fax:  [phone]  Name: Molly Castaneda MRN: [id number] Date of Birth: 1960-06-23

## 2017-05-10 ENCOUNTER — Encounter: Payer: Medicare HMO | Admitting: *Deleted

## 2017-05-10 DIAGNOSIS — M79605 Pain in left leg: Secondary | ICD-10-CM | POA: Diagnosis not present

## 2017-05-10 DIAGNOSIS — G8921 Chronic pain due to trauma: Secondary | ICD-10-CM | POA: Diagnosis not present

## 2017-05-10 DIAGNOSIS — R69 Illness, unspecified: Secondary | ICD-10-CM | POA: Diagnosis not present

## 2017-05-10 DIAGNOSIS — M545 Low back pain: Secondary | ICD-10-CM | POA: Diagnosis not present

## 2017-05-14 ENCOUNTER — Ambulatory Visit: Payer: Medicare HMO | Admitting: *Deleted

## 2017-05-14 DIAGNOSIS — G8929 Other chronic pain: Secondary | ICD-10-CM | POA: Diagnosis not present

## 2017-05-14 DIAGNOSIS — M25561 Pain in right knee: Principal | ICD-10-CM

## 2017-05-14 NOTE — Therapy (Signed)
[facility] [address] [place], [place], [address] Phone: [phone]   Fax:  [phone]  Physical Therapy Treatment  Patient Details  Name: Molly HarperShelia A Castaneda MRN: [id number] Date of Birth: 1960-04-06 Referring Provider: Jannifer RodneyHawks, Christy   Encounter Date: 05/14/2017  PT End of Session - 05/14/17 0823    Visit Number  10    Number of Visits  12    Date for PT Re-Evaluation  05/14/17    PT Start Time  0815    PT Stop Time  0905    PT Time Calculation (min)  50 min       Past Medical History:  Diagnosis Date  . Arthritis   . Chronic back pain   . Chronic constipation   . Depression   . Hypertension   . MVP (mitral valve prolapse)    had ablation 1998  . Numbness in left leg    s/p spinal surgery  . Supraventricular tachycardia (HCC)    ablation 1998  . Vitamin D deficiency     Past Surgical History:  Procedure Laterality Date  . AV NODE ABLATION  1998   for svt  . BACK SURGERY  2001   lumbar fusion  . PARTIAL HYSTERECTOMY  1993  . REPAIR EXTENSOR TENDON Left 03/06/2013   Procedure: LEFT LONG BOUTONNIERE REPAIR;  Surgeon: Wyn Forsterobert V Sypher Jr., MD;  Location: [facility];  Service: Orthopedics;  Laterality: Left;  . SPINE SURGERY    . stimulation system implant  07/31/00   lumbar nerve stimulator-none funtioning now    There were no vitals filed for this visit.  Subjective Assessment - 05/14/17 0818    Subjective  RT kne feeling stiff today.    Pertinent History  chronic pain, depression, internal stimulator but "dead"    Patient Stated Goals  get down on my knee and have no pain    Currently in Pain?  Yes    Pain Score  4     Pain Orientation  Right    Pain Descriptors / Indicators  Burning    Pain Type  Chronic pain    Pain Onset  More than a month ago                       [facility]/Exercise - 05/14/17 0001      Knee/Hip Exercises: Aerobic   Nustep  Level 4 x 20  minutes      Knee/Hip Exercises: Standing   Forward Lunges  --    Terminal Knee Extension  --    Theraband Level (Terminal Knee Extension)  -- Pink XTS    Hip Abduction  Stengthening;Both;10 reps;Knee straight;3 sets;Right red theraband    Abduction Limitations  2#    Forward Step Up  Both;10 reps;Hand Hold: 2;Step Height: 6"    Rocker Board  3 minutes      Knee/Hip Exercises: Seated   Long Arc Quad  Right;3 sets;Strengthening    Long Arc Quad Weight  2 lbs.      Modalities   Modalities  Cryotherapy      Moist Heat Therapy   Number Minutes Moist Heat  10 Minutes    Moist Heat Location  Knee                  PT Long Term Goals - 05/09/17 [id number]      PT LONG TERM GOAL #1   Title  Patient will be  independent with HEP    Period  Weeks    Status  Achieved      PT LONG TERM GOAL #2   Title  Patient will improve tandem stance to greater than 10 seconds with distant supervision for improved balance.    Time  6    Period  Weeks    Status  Achieved      PT LONG TERM GOAL #3   Title  Patient will kneel for 10 minutes with knee pads with less than 2/10 pain to perform home activities.     Time  6    Period  Weeks    Status  On-going      PT LONG TERM GOAL #4   Title  Patient will improve knee flexion and extension MMT to 5/5 for improved stability during home activities.    Time  6    Period  Weeks    Status  On-going      PT LONG TERM GOAL #5   Title  Patient will improve R hip abduction MMT to 5/5 for improved knee stability during gait and home activities.    Time  6    Period  Weeks    Status  On-going            Plan - 05/14/17 0908    Clinical Impression Statement  Pt arrived today doing fairly well with pain 3-4/10 RT knee . She was able to perform all therex for knee and hip strengthening with pain rising to 5/10. Ice used today due to MH not available.    Clinical Presentation  Stable    Clinical Decision Making  Low    Rehab Potential  Good     PT Frequency  2x / week    PT Duration  6 weeks    PT Treatment/Interventions  Cryotherapy;ADLs/Self Care Home Management;Moist Heat;Therapeutic exercise;Therapeutic activities;Balance training;Neuromuscular re-education;Patient/family education;Manual techniques;Passive range of motion    PT Next Visit Plan  Assess LTG #3-5. Continue kneeling on airex. Continue knee strengthening exercises to pt tolerance to return to PLOF.    Consulted and Agree with Plan of Care  Patient       Patient will benefit from skilled therapeutic intervention in order to improve the following deficits and impairments:  Pain, Decreased range of motion, Decreased strength  Visit Diagnosis: Chronic pain of right knee     Problem List Patient Active Problem List   Diagnosis Date Noted  . Peripheral edema 09/07/2015  . Vitamin D deficiency 06/22/2014  . Depression 06/21/2014  . GAD (generalized anxiety disorder) 06/21/2014  . Back pain 06/21/2014  . Osteopenia 02/11/2013  . Constipation 05/08/2010    Kayde Warehime,CHRIS, PTA 05/14/2017, 9:22 AM  [facility] [address] [place], [place], [address] Phone: [phone]   Fax:  [phone]  Name: Molly Castaneda MRN: [id number] Date of Birth: [birth year]

## 2017-05-17 ENCOUNTER — Ambulatory Visit: Payer: Medicare HMO | Admitting: *Deleted

## 2017-05-17 DIAGNOSIS — G8929 Other chronic pain: Secondary | ICD-10-CM | POA: Diagnosis not present

## 2017-05-17 DIAGNOSIS — M25561 Pain in right knee: Secondary | ICD-10-CM | POA: Diagnosis not present

## 2017-05-17 NOTE — Therapy (Addendum)
[facility] [place], [place], [address] Phone: [phone]   Fax:  [phone]  Physical Therapy Treatment  Patient Details  Name: Molly Castaneda MRN: [id number] Date of Birth: [birth year] Referring Provider: Evelina Dun   Encounter Date: 05/17/2017  PT End of Session - 05/17/17 0831    Visit Number  11    Number of Visits  12    Date for PT Re-Evaluation  05/14/17    PT Start Time  0815    PT Stop Time  0905    PT Time Calculation (min)  50 min    Activity Tolerance  Patient tolerated treatment well    Behavior During Therapy  [facility] for tasks assessed/performed       Past Medical History:  Diagnosis Date  . Arthritis   . Chronic back pain   . Chronic constipation   . Depression   . Hypertension   . MVP (mitral valve prolapse)    had ablation 1998  . Numbness in left leg    s/p spinal surgery  . Supraventricular tachycardia ([place])    ablation 1998  . Vitamin D deficiency     Past Surgical History:  Procedure Laterality Date  . [place]   for svt  . BACK SURGERY  2001   lumbar fusion  . PARTIAL HYSTERECTOMY  1993  . REPAIR EXTENSOR TENDON Left 03/06/2013   Procedure: LEFT LONG BOUTONNIERE REPAIR;  Surgeon: Cammie Sickle., MD;  Location: [facility];  Service: Orthopedics;  Laterality: Left;  . SPINE SURGERY    . stimulation system implant  07/31/00   lumbar nerve stimulator-none funtioning now    There were no vitals filed for this visit.  Subjective Assessment - 05/17/17 0832    Subjective  RT knee feeling tight "pins and needles"  Will make F/U appt with MD about PT  (Pended)     Pertinent History  chronic pain, depression, internal stimulator but "dead"    Limitations  House hold activities    Currently in Pain?  Yes    Pain Score  4     Pain Location  Knee    Pain Descriptors / Indicators  Burning    Pain Type  Chronic pain    Pain Onset  More than a month ago                        [facility] Adult PT Treatment/Exercise - 05/17/17 0001      Knee/Hip Exercises: Aerobic   Nustep  Level 4 x 20 minutes      Knee/Hip Exercises: Standing   Hip Abduction  Stengthening;Both;10 reps;Knee straight;3 sets;Right yellow tband      Knee/Hip Exercises: Seated   Long Arc Quad  Right;3 sets;Strengthening;10 reps pause at top    [employer]  5 lbs.      Knee/Hip Exercises: Supine   Other Supine Knee/Hip Exercises  Supine clamshells yellow Theraband 2x20 and red                  PT Long Term Goals - 05/17/17 9892      PT LONG TERM GOAL #1   Title  Patient will be independent with HEP    Period  Weeks    Status  Achieved      PT LONG TERM GOAL #2   Title  Patient will improve tandem stance to greater than 10  seconds with distant supervision for improved balance.    Time  6    Period  Weeks    Status  Achieved      PT LONG TERM GOAL #3   Title  Patient will kneel for 10 minutes with knee pads with less than 2/10 pain to perform home activities.     Time  6    Period  Weeks    Status  Not Met NM due to pain 5-6/10      PT LONG TERM GOAL #4   Title  Patient will improve knee flexion and extension MMT to 5/5 for improved stability during home activities.    Time  6    Period  Weeks    Status  Not Met NM 4/5      PT LONG TERM GOAL #5   Title  Patient will improve R hip abduction MMT to 5/5 for improved knee stability during gait and home activities.    Time  6    Period  Weeks    Status  Not Met NM 4/5            Plan - 05/17/17 0914    Clinical Impression Statement  Pt was able to complete 2 LTGs , but unable to meet strength goals due to mild weakness in Knee ext/flexion as well as hip abduction 4/5. Pt was able to complete all therex and is  independent with HEP. Pt to f/u with MD    Rehab Potential  Good    PT Frequency  2x / week    PT Duration  6 weeks    PT Treatment/Interventions   Cryotherapy;ADLs/Self Care Home Management;Moist Heat;Therapeutic exercise;Therapeutic activities;Balance training;Neuromuscular re-education;Patient/family education;Manual techniques;Passive range of motion    PT Next Visit Plan  Pt to F/U with MD about more PT or DC to HEP       Patient will benefit from skilled therapeutic intervention in order to improve the following deficits and impairments:  Pain, Decreased range of motion, Decreased strength  Visit Diagnosis: Chronic pain of right knee     Problem List Patient Active Problem List   Diagnosis Date Noted  . Peripheral edema 09/07/2015  . Vitamin D deficiency 06/22/2014  . Depression 06/21/2014  . GAD (generalized anxiety disorder) 06/21/2014  . Back pain 06/21/2014  . Osteopenia 02/11/2013  . Constipation 05/08/2010   PHYSICAL THERAPY DISCHARGE SUMMARY  Visits from Start of Care: 11  Current functional level related to goals / functional outcomes: See above   Remaining deficits: See goals   Education / Equipment: HEP Plan: Patient agrees to discharge.  Patient goals were partially met. Patient is being discharged due to not returning since the last visit.  ?????        RAMSEUR,CHRIS, PTA  05/17/2017, 9:25 AM  [facility] [place], [place], [address] Phone: [phone]   Fax:  [phone]  Name: Molly Castaneda MRN: [id number] Date of Birth: [birth year]

## 2017-05-20 ENCOUNTER — Encounter: Payer: Self-pay | Admitting: Family

## 2017-05-20 ENCOUNTER — Ambulatory Visit (INDEPENDENT_AMBULATORY_CARE_PROVIDER_SITE_OTHER): Payer: Medicare HMO | Admitting: Family

## 2017-05-20 VITALS — BP 123/78 | HR 90 | Temp 97.8°F | Ht 66.0 in | Wt 214.0 lb

## 2017-05-20 DIAGNOSIS — F331 Major depressive disorder, recurrent, moderate: Secondary | ICD-10-CM

## 2017-05-20 DIAGNOSIS — F411 Generalized anxiety disorder: Secondary | ICD-10-CM

## 2017-05-20 DIAGNOSIS — E559 Vitamin D deficiency, unspecified: Secondary | ICD-10-CM

## 2017-05-20 DIAGNOSIS — B002 Herpesviral gingivostomatitis and pharyngotonsillitis: Secondary | ICD-10-CM | POA: Diagnosis not present

## 2017-05-20 DIAGNOSIS — G8929 Other chronic pain: Secondary | ICD-10-CM | POA: Diagnosis not present

## 2017-05-20 DIAGNOSIS — M25561 Pain in right knee: Secondary | ICD-10-CM | POA: Diagnosis not present

## 2017-05-20 DIAGNOSIS — E669 Obesity, unspecified: Secondary | ICD-10-CM

## 2017-05-20 DIAGNOSIS — K5900 Constipation, unspecified: Secondary | ICD-10-CM | POA: Diagnosis not present

## 2017-05-20 DIAGNOSIS — R69 Illness, unspecified: Secondary | ICD-10-CM | POA: Diagnosis not present

## 2017-05-20 DIAGNOSIS — E663 Overweight: Secondary | ICD-10-CM | POA: Insufficient documentation

## 2017-05-20 DIAGNOSIS — R609 Edema, unspecified: Secondary | ICD-10-CM | POA: Diagnosis not present

## 2017-05-20 MED ORDER — VALACYCLOVIR HCL 1 G PO TABS: 2000.0000 mg | ORAL_TABLET | Freq: Two times a day (BID) | ORAL | 0 refills | Status: DC

## 2017-05-20 NOTE — Progress Notes (Signed)
Subjective:    Patient ID: Molly Castaneda, female    DOB: 1960-07-27, 57 y.o.   MRN: [id number]  PT presents to the office today to recheck right knee pain. PT was in [place] in 09/2016. She has completed physical therapy and is followed by Ortho. PT states she has an injection  04/05/17 and states this helped.   PT also complaining of oral lesion on her lower lip. States she noticed it Thursday and has tried OTC with no relief.  Knee Pain   The injury mechanism was a direct blow. The pain is present in the right knee. The pain is at a severity of 3/10. The pain is mild. The pain has been intermittent since onset. She reports no foreign bodies present. The symptoms are aggravated by weight bearing and palpation. She has tried NSAIDs, acetaminophen and rest for the symptoms. The treatment provided moderate relief.  Anxiety  Presents for follow-up visit. Symptoms include depressed mood, excessive worry, irritability, nervous/anxious behavior and restlessness. Symptoms occur constantly. The severity of symptoms is moderate.    Depression         This is a chronic problem.  The current episode started more than 1 year ago.   The problem occurs intermittently.  Associated symptoms include helplessness, irritable and restlessness.  Past medical history includes anxiety.   Constipation  This is a chronic problem. The current episode started more than 1 year ago. The problem has been waxing and waning since onset. She has tried laxatives for the symptoms. The treatment provided moderate relief.  Peripheral Edema PT takes Lasix 40 mg daily. Stable    Review of Systems  Constitutional: Positive for irritability.  Gastrointestinal: Positive for constipation.  Musculoskeletal: Positive for arthralgias.  Psychiatric/Behavioral: Positive for depression. The patient is nervous/anxious.   All other systems reviewed and are negative.      Objective:   Physical Exam  Constitutional: She is oriented to  person, place, and time. She appears well-developed and well-nourished. She is irritable. No distress.  HENT:  Head: Normocephalic.  Oral lesion on lower lip  Eyes: Pupils are equal, round, and reactive to light.  Neck: Normal range of motion. Neck supple. No thyromegaly present.  Cardiovascular: Normal rate, regular rhythm, normal heart sounds and intact distal pulses.  No murmur heard. Pulmonary/Chest: Effort normal and breath sounds normal. No respiratory distress. She has no wheezes.  Abdominal: Soft. Bowel sounds are normal. She exhibits no distension. There is no tenderness.  Musculoskeletal: Normal range of motion. She exhibits edema (trace in BLE). She exhibits no tenderness.  Neurological: She is alert and oriented to person, place, and time.  Skin: Skin is warm and dry.  Psychiatric: She has a normal mood and affect. Her behavior is normal. Judgment and thought content normal.  Vitals reviewed.   BP 123/78   Pulse 90   Temp 97.8 F (36.6 C) (Oral)   Ht 5' 6"  (1.676 m)   Wt 214 lb (97.1 kg)   BMI 34.54 kg/m      Assessment & Plan:  1. Chronic pain of right knee - CMP14+EGFR  2. Obesity (BMI 30.0-34.9) - CMP14+EGFR  3. Oral herpes  - CMP14+EGFR - valACYclovir (VALTREX) 1000 MG tablet; Take 2 tablets (2,000 mg total) by mouth 2 (two) times daily.  Dispense: 4 tablet; Refill: 0  4. GAD (generalized anxiety disorder) - CMP14+EGFR  5. Vitamin D deficiency  - CMP14+EGFR - VITAMIN D 25 Hydroxy (Vit-D Deficiency, Fractures)  6. Peripheral edema -  CMP14+EGFR  7. Moderate episode of recurrent major depressive disorder (HCC)  - CMP14+EGFR  8. Constipation, unspecified constipation type  - CMP14+EGFR   Continue all meds Labs pending Health Maintenance reviewed Diet and exercise encouraged RTO 6 months   Evelina Dun, FNP

## 2017-05-20 NOTE — Patient Instructions (Signed)
Cold Sore A cold sore, also called a fever blister, is a skin infection that causes small, fluid-filled sores to form inside of the mouth or on the lips, gums, nose, chin, or cheeks. Cold sores can spread to other parts of the body, such as the eyes or fingers. In some people with other medical conditions, cold sores can spread to multiple other body sites, including the genitals. Cold sores can be spread or passed from person to person (contagious) until the sores crust over completely. What are the causes? Cold sores are caused by the herpes simplex virus (HSV-1). HSV-1 is closely related to the virus that causes genital herpes (HSV-2), but these viruses are not the same. Once a person is infected with HSV-1, the virus remains permanently in the body. HSV-1 is spread from person to person through close contact, such as through kissing, touching the affected area, or sharing personal items such as lip balm, razors, or eating utensils. What increases the risk? A cold sore outbreak is more likely to develop in people who:  Are tired, stressed, or sick.  Are menstruating.  Are pregnant.  Take certain medicines.  Are exposed to cold weather or too much sun.  What are the signs or symptoms? Symptoms of a cold sore outbreak often go through different stages. Here is how a cold sore develops:  Tingling, itching, or burning is felt 1-2 days before the outbreak.  Fluid-filled blisters appear on the lips, inside the mouth, on the nose, or on the cheeks.  The blisters start to ooze clear fluid.  The blisters dry up and a yellow crust appears in its place.  The crust falls off.  Other symptoms include:  Fever.  Sore throat.  Headache.  Muscle aches.  Swollen neck glands.  You also may not have any symptoms. How is this diagnosed? This condition is often diagnosed based on your medical history and a physical exam. Your health care provider may swab your sore and then examine it in  the lab. Rarely, blood tests may be done to check for HSV-1. How is this treated? There is no cure for cold sores or HSV-1. There also is no vaccine for HSV-1. Most cold sores go away on their own without treatment within two weeks. Medicines cannot make the infection go away, but medicines can:  Help relieve some of the pain associated with the sores.  Work to stop the virus from multiplying.  Shorten healing time.  Medicines may be in the form of creams, gels, pills, or a shot. Follow these instructions at home: Medicines  Take or apply over-the-counter and prescription medicines only as told by your health care provider.  Use a cotton-tip swab to apply creams or gels to your sores. Sore Care  Do not touch the sores or pick the scabs.  Wash your hands often. Do not touch your eyes without washing your hands first.  Keep the sores clean and dry.  If directed, apply ice to the sores: ? Put ice in a plastic bag. ? Place a towel between your skin and the bag. ? Leave the ice on for 20 minutes, 2-3 times per day. Lifestyle  Do not kiss, have oral sex, or share personal items until your sores heal.  Eat a soft, bland diet. Avoid eating hot, cold, or salty foods. These can hurt your mouth.  Use a straw if it hurts to drink out of a glass.  Avoid the sun and limit your stress if these things   trigger outbreaks. If sun causes cold sores, apply sunscreen on your lips before being out in the sun. Contact a health care provider if:  You have symptoms for more than two weeks.  You have pus coming from the sores.  You have redness that is spreading.  You have pain or irritation in your eye.  You get sores on your genitals.  Your sores do not heal within two weeks.  You have frequent cold sore outbreaks. Get help right away if:  You have a fever and your symptoms suddenly get worse.  You have a headache and confusion. This information is not intended to replace advice  given to you by your health care provider. Make sure you discuss any questions you have with your health care provider. Document Released: 01/20/2000 Document Revised: 09/16/2015 Document Reviewed: 11/12/2014 Elsevier Interactive Patient Education  2018 [employer] Inc[employer]  

## 2017-05-21 ENCOUNTER — Encounter: Payer: Medicare HMO | Admitting: *Deleted

## 2017-05-21 LAB — CMP14+EGFR
A/G RATIO: 1.7 (ref 1.2–2.2)
ALT: 15 IU/L (ref 0–32)
AST: 16 IU/L (ref 0–40)
Albumin: 4.1 g/dL (ref 3.5–5.5)
Alkaline Phosphatase: 67 IU/L (ref 39–117)
BILIRUBIN TOTAL: 0.4 mg/dL (ref 0.0–1.2)
BUN/Creatinine Ratio: 19 (ref 9–23)
BUN: 16 mg/dL (ref 6–24)
CHLORIDE: 103 mmol/L (ref 96–106)
CO2: 25 mmol/L (ref 20–29)
Calcium: 9.3 mg/dL (ref 8.7–10.2)
Creatinine, Ser: 0.83 mg/dL (ref 0.57–1.00)
GFR calc non Af Amer: 78 mL/min/{1.73_m2} (ref >59)
GFR, EST AFRICAN AMERICAN: 90 mL/min/{1.73_m2} (ref >59)
Globulin, Total: 2.4 g/dL (ref 1.5–4.5)
Glucose: 81 mg/dL (ref 65–99)
POTASSIUM: 4.6 mmol/L (ref 3.5–5.2)
Sodium: 140 mmol/L (ref 134–144)
TOTAL PROTEIN: 6.5 g/dL (ref 6.0–8.5)

## 2017-05-21 LAB — VITAMIN D 25 HYDROXY (VIT D DEFICIENCY, FRACTURES): Vit D, 25-Hydroxy: 64 ng/mL (ref 30.0–100.0)

## 2017-05-23 DIAGNOSIS — R69 Illness, unspecified: Secondary | ICD-10-CM | POA: Diagnosis not present

## 2017-05-27 ENCOUNTER — Other Ambulatory Visit: Payer: Self-pay | Admitting: Family

## 2017-05-27 DIAGNOSIS — R609 Edema, unspecified: Secondary | ICD-10-CM

## 2017-05-28 ENCOUNTER — Telehealth: Payer: Self-pay | Admitting: *Deleted

## 2017-05-28 ENCOUNTER — Ambulatory Visit (INDEPENDENT_AMBULATORY_CARE_PROVIDER_SITE_OTHER): Payer: Medicare HMO

## 2017-05-28 ENCOUNTER — Other Ambulatory Visit: Payer: Self-pay

## 2017-05-28 VITALS — BP 115/58 | HR 80 | Temp 97.8°F | Ht 66.0 in | Wt 212.0 lb

## 2017-05-28 DIAGNOSIS — Z0000 Encounter for general adult medical examination without abnormal findings: Secondary | ICD-10-CM | POA: Diagnosis not present

## 2017-05-28 DIAGNOSIS — R609 Edema, unspecified: Secondary | ICD-10-CM

## 2017-05-28 MED ORDER — FUROSEMIDE 40 MG PO TABS
40.0000 mg | ORAL_TABLET | Freq: Every day | ORAL | 1 refills | Status: DC
Start: 2017-05-28 — End: 2017-11-10

## 2017-05-28 NOTE — Patient Instructions (Addendum)
  [name] , Thank you for taking time to come for your Medicare Wellness Visit. I appreciate your ongoing commitment to your health goals. Please review the following plan we discussed and let me know if I can assist you in the future.   These are the goals we discussed: Goals    . DIET - EAT MORE FRUITS AND VEGETABLES    . Exercise 3x per week (30 min per time)       This is a list of the screening recommended for you and due dates:  Health Maintenance  Topic Date Due  . Pap Smear  02/06/2016  . Flu Shot  09/05/2017  . DEXA scan (bone density measurement)  11/15/2017  . Mammogram  12/15/2018  . Colon Cancer Screening  02/06/2019  . Tetanus Vaccine  10/18/2021  .  Hepatitis C: One time screening is recommended by Center for Disease Control  (CDC) for  adults born from [birth year] through 1967.   Completed  . HIV Screening  Completed

## 2017-05-28 NOTE — Progress Notes (Signed)
Subjective:   Molly Castaneda is a 57 y.o. female who presents for an Initial Medicare Annual Wellness Visit. 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  She lives in her own home with her husband in [place].  She enjoys making wreaths and walking for exercise.  She and her husband have two children and four grandchildren who all live close by.        Objective:    Today's Vitals   05/28/17 0849  BP: (!) 115/58  Pulse: 80  Temp: 97.8 F (36.6 C)  TempSrc: Oral  Weight: 212 lb (96.2 kg)  Height: 5\' 6"  (1.676 m)   Body mass index is 34.22 kg/m.  Advanced Directives 05/28/2017 04/02/2017 09/11/2016 08/04/2014 12/13/2013 03/03/2013  Does Patient Have a Medical Advance Directive? No No No No No Patient has advance directive, copy not in chart  Type of Advance Directive - - - - - Living will  Would patient like information on creating a medical advance directive? No - Patient declined - - - No - patient declined information -    Current Medications (verified) Outpatient Encounter Medications as of 05/28/2017  Medication Sig  . amphetamine-dextroamphetamine (ADDERALL) 10 MG tablet Take 10 mg by mouth 2 (two) times daily with a meal.   . aspirin 81 MG tablet Take 81 mg by mouth daily.  Marland Kitchen buPROPion (WELLBUTRIN SR) 150 MG 12 hr tablet Take 150 mg by mouth 2 (two) times daily.   . Calcium Carbonate 500 MG CHEW Chew 1 tablet (500 mg total) by mouth daily.  . clonazePAM (KLONOPIN) 0.25 MG disintegrating tablet Take 0.25 mg by mouth 2 (two) times daily.  Marland Kitchen estradiol (ESTRACE) 1 MG tablet Take 1 mg by mouth daily.  Marland Kitchen FLUoxetine (PROZAC) 10 MG tablet Take 10 mg by mouth daily.  Marland Kitchen linaclotide (LINZESS) 145 MCG CAPS capsule Take 1 capsule (145 mcg total) by mouth daily before breakfast.  . meloxicam (MOBIC) 7.5 MG tablet TAKE 1 TABLET BY MOUTH ONCE DAILY  . morphine (MS CONTIN) 15 MG 12 hr tablet Take 15 mg by mouth 3 (three) times daily.  . Omega 3-6-9 Fatty Acids  (OMEGA 3-6-9 COMPLEX) CAPS Take 1 capsule by mouth daily.    Marland Kitchen tiZANidine (ZANAFLEX) 4 MG tablet Take 1 Tablet by mouth at bedtime as needed FOR MUSCLE SPASMS  . vitamin B-12 (CYANOCOBALAMIN) 1000 MCG tablet Take 1,000 mcg by mouth daily.    . Vitamin D, Cholecalciferol, 1000 units TABS Take 1,000 Units by mouth daily.  . [DISCONTINUED] furosemide (LASIX) 40 MG tablet PLEASE SEE ATTACHED FOR DETAILED DIRECTIONS  . [DISCONTINUED] valACYclovir (VALTREX) 1000 MG tablet Take 2 tablets (2,000 mg total) by mouth 2 (two) times daily.   No facility-administered encounter medications on file as of 05/28/2017.     Allergies (verified) Oxycodone-acetaminophen; Oxycontin [oxycodone hcl]; and Hydrocodone   History: Past Medical History:  Diagnosis Date  . Arthritis   . Chronic back pain   . Chronic constipation   . Depression   . MVP (mitral valve prolapse)    had ablation 1998  . Numbness in left leg    s/p spinal surgery  . Supraventricular tachycardia (HCC)    ablation 1998  . Vitamin D deficiency    Past Surgical History:  Procedure Laterality Date  . AV NODE ABLATION  1998   for svt  . BACK SURGERY  2001   lumbar fusion  . PARTIAL HYSTERECTOMY  1993  . REPAIR EXTENSOR TENDON  Left 03/06/2013   Procedure: LEFT LONG BOUTONNIERE REPAIR;  Surgeon: Wyn Forster., MD;  Location: [facility];  Service: Orthopedics;  Laterality: Left;  . SPINE SURGERY    . stimulation system implant  07/31/00   lumbar nerve stimulator-none funtioning now   Family History  Problem Relation Age of Onset  . Coronary artery disease Father   . Colon polyps Father   . Prostate cancer Maternal Grandfather   . Hodgkin's lymphoma Son   . Liver cancer Paternal Uncle        ?  Marland Kitchen Arthritis Mother   . Colon cancer Neg Hx    Social History   Socioeconomic History  . Marital status: Married    Spouse name: Not on file  . Number of children: Not on file  . Years of education: Not on file  .  Highest education level: Not on file  Occupational History  . Occupation: disabled    Associate Professor: UNEMPLOYED  Social Needs  . Financial resource strain: Not on file  . Food insecurity:    Worry: Not on file    Inability: Not on file  . Transportation needs:    Medical: No    Non-medical: No  Tobacco Use  . Smoking status: Never Smoker  . Smokeless tobacco: Never Used  Substance and Sexual Activity  . Alcohol use: No  . Drug use: No  . Sexual activity: Never  Lifestyle  . Physical activity:    Days per week: 3 days    Minutes per session: 30 min  . Stress: Not on file  Relationships  . Social connections:    Talks on phone: Not on file    Gets together: Not on file    Attends religious service: Not on file    Active member of club or organization: Not on file    Attends meetings of clubs or organizations: Not on file    Relationship status: Not on file  Other Topics Concern  . Not on file  Social History Narrative  . Not on file    Tobacco Counseling  Never smoked      Activities of Daily Living In your present state of health, do you have any difficulty performing the following activities: 05/28/2017 05/28/2017  Hearing? - N  Vision? - N  Difficulty concentrating or making decisions? - N  Walking or climbing stairs? - N  Doing errands, shopping? - N  Quarry manager and eating ? N N  Using the Toilet? N N  In the past six months, have you accidently leaked urine? - N  Do you have problems with loss of bowel control? - N  Managing your Medications? - N  Managing your Finances? - N  Housekeeping or managing your Housekeeping? - N  Some recent data might be hidden   Patient reports no problems with vision or hearing.    Immunizations and Health Maintenance Immunization History  Administered Date(s) Administered  . Influenza Split 10/31/2012  . Influenza,inj,Quad PF,6+ Mos 11/11/2014, 12/14/2015  . Influenza,inj,quad, With Preservative 12/03/2016  .  Influenza-Unspecified 11/01/2013, 11/06/2014, 03/05/2017  . Tdap 10/19/2011   Health Maintenance Due  Topic Date Due  . PAP SMEAR  02/06/2016    Patient Care Team: [name], FNP as PCP - General (Family Medicine) Lomax, Billey Chang, MD (Inactive) as Attending Physician (Obstetrics and Gynecology) McCain, Chucky May, FNP (Nurse Practitioner) Linton Ham, MD as Referring Physician (Psychiatry)  Indicate any recent Medical Services you [phone]  have received from other than [facility] providers in the past year (date may be approximate).     Assessment:   This is a routine wellness examination for Tiye.  Hearing/Vision screen No exam data present  Dietary issues and exercise activities discussed: Current Exercise Habits: Home exercise routine, Type of exercise: walking, Time (Minutes): 30, Frequency (Times/Week): 3, Weekly Exercise (Minutes/Week): 90, Intensity: Moderate, Exercise limited by: orthopedic condition(s)  Goals    . DIET - EAT MORE FRUITS AND VEGETABLES    . Exercise 3x per week (30 min per time)      Depression Screen PHQ 2/9 Scores 05/28/2017 05/20/2017 04/02/2017 02/14/2017 12/28/2016 12/14/2016 10/25/2016  PHQ - 2 Score 2 1 5 5 1  - 2  PHQ- 9 Score 9 - 21 21 - - -  Exception Documentation - - - - - Other- indicate reason in comment box -  Not completed - - - - - Goes to counseling. -    Fall Risk Fall Risk  05/20/2017 04/02/2017 02/14/2017 12/28/2016 10/25/2016  Falls in the past year? Yes Yes Yes Yes Yes  Number falls in past yr: 2 or more 2 or more 2 or more 2 or more 2 or more  Injury with Fall? Yes Yes Yes No -  Comment - Finger Finger - -    Is the patient's home free of loose throw rugs in walkways, pet beds, electrical cords, etc?   no      Grab bars in the bathroom? no      Handrails on the stairs?   no      Adequate lighting?   yes    Cognitive Function: MMSE - Mini Mental State Exam 05/28/2017  Orientation to time 4  Orientation to Place 5    Registration 3  Attention/ Calculation 5  Recall 2  Language- name 2 objects 2  Language- repeat 1  Language- follow 3 step command 3  Language- read & follow direction 1  Write a sentence 1  Copy design 1  Total score 28    Patient did well on her MMSE scoring 28 out of 30 available points.    Screening Tests Health Maintenance  Topic Date Due  . PAP SMEAR  02/06/2016  . INFLUENZA VACCINE  09/05/2017  . DEXA SCAN  11/15/2017  . MAMMOGRAM  12/15/2018  . COLONOSCOPY  02/06/2019  . TETANUS/TDAP  10/18/2021  . Hepatitis C Screening  Completed  . HIV Screening  Completed    Qualifies for Shingles Vaccine? Yes  Cancer Screenings: Lung: Low Dose CT Chest recommended if Age 76-80 years, 30 pack-year currently smoking OR have quit w/in 15years  Patient is a non-smoker Mammogram:  12/14/2016 Pap Smear:  [facility] Dexa Scan:  11/16/2015 Colonoscopy:  08/06/2008    Plan:   Follow up with PCP .  Patient is due for her pap smear and Dexa Scan.  Will be due for her colonoscopy in July 2020.  I have personally reviewed and noted the following in the patient's chart:   . Medical and social history . Use of alcohol, tobacco or illicit drugs  . Current medications and supplements . Functional ability and status . Nutritional status . Physical activity . Advanced directives . List of other physicians . Hospitalizations, surgeries, and ER visits in previous 12 months . Vitals . Screenings to include cognitive, depression, and falls . Referrals and appointments  In addition, I have reviewed and discussed with patient certain preventive protocols, quality metrics, and best  practice recommendations. A written personalized care plan for preventive services as well as general preventive health recommendations were provided to patient.    I have reviewed and agree with the above AWV documentation.   [name], FNP

## 2017-05-28 NOTE — Telephone Encounter (Signed)
Prescription sent to pharmacy.

## 2017-05-28 NOTE — Telephone Encounter (Signed)
Fax received [facility] clarification Please clarify Sig and resend

## 2017-06-04 ENCOUNTER — Ambulatory Visit (INDEPENDENT_AMBULATORY_CARE_PROVIDER_SITE_OTHER): Payer: Medicare HMO | Admitting: Family

## 2017-06-04 ENCOUNTER — Encounter: Payer: Self-pay | Admitting: Family

## 2017-06-04 VITALS — BP 125/79 | HR 84 | Temp 97.8°F | Ht 66.0 in | Wt 213.4 lb

## 2017-06-04 DIAGNOSIS — M542 Cervicalgia: Secondary | ICD-10-CM

## 2017-06-04 DIAGNOSIS — M8949 Other hypertrophic osteoarthropathy, multiple sites: Secondary | ICD-10-CM

## 2017-06-04 DIAGNOSIS — M150 Primary generalized (osteo)arthritis: Secondary | ICD-10-CM | POA: Diagnosis not present

## 2017-06-04 DIAGNOSIS — K5903 Drug induced constipation: Secondary | ICD-10-CM | POA: Diagnosis not present

## 2017-06-04 DIAGNOSIS — M159 Polyosteoarthritis, unspecified: Secondary | ICD-10-CM

## 2017-06-04 MED ORDER — DICLOFENAC SODIUM 75 MG PO TBEC: 75.0000 mg | DELAYED_RELEASE_TABLET | Freq: Two times a day (BID) | ORAL | 2 refills | Status: DC

## 2017-06-04 MED ORDER — LUBIPROSTONE 24 MCG PO CAPS: 24.0000 ug | ORAL_CAPSULE | Freq: Two times a day (BID) | ORAL | 5 refills | Status: DC

## 2017-06-04 NOTE — Progress Notes (Signed)
   Subjective:    Patient ID: Molly Castaneda, female    DOB: 10-07-1960, 57 y.o.   MRN: [id number]  Neck Pain   This is a recurrent problem. The current episode started more than 1 month ago. The problem occurs intermittently. The problem has been waxing and waning. The pain is present in the left side. The quality of the pain is described as aching. The pain is at a severity of 5/10. The pain is moderate. The symptoms are aggravated by bending. The pain is worse during the night. Pertinent negatives include no fever, headaches, paresis, photophobia or visual change. She has tried bed rest, ice, muscle relaxants and oral narcotics for the symptoms. The treatment provided mild relief.  Shoulder Pain   Pertinent negatives include no fever.  Constipation  This is a chronic problem. The current episode started more than 1 year ago. The problem has been waxing and waning since onset. Her stool frequency is 2 to 3 times per week. Pertinent negatives include no fever. She has tried laxatives for the symptoms. The treatment provided mild relief.      Review of Systems  Constitutional: Negative for fever.  Eyes: Negative for photophobia.  Gastrointestinal: Positive for constipation.  Musculoskeletal: Positive for neck pain.  Neurological: Negative for headaches.  All other systems reviewed and are negative.      Objective:   Physical Exam  Constitutional: She is oriented to person, place, and time. She appears well-developed and well-nourished. No distress.  HENT:  Head: Normocephalic and atraumatic.  Cardiovascular: Normal rate, regular rhythm, normal heart sounds and intact distal pulses.  No murmur heard. Pulmonary/Chest: Effort normal and breath sounds normal. No respiratory distress. She has no wheezes.  Abdominal: Bowel sounds are normal. She exhibits no distension. There is no tenderness.  Musculoskeletal: Normal range of motion. She exhibits no edema or tenderness.  Neurological: She  is alert and oriented to person, place, and time. She has normal reflexes.  Skin: Skin is warm and dry.  Psychiatric: She has a normal mood and affect. Her behavior is normal. Judgment and thought content normal.  Vitals reviewed.     BP 125/79   Pulse 84   Temp 97.8 F (36.6 C) (Oral)   Ht  (1.676 m)   Wt 213 lb 6.4 oz (96.8 kg)   BMI 34.44 kg/m      Assessment & Plan:  1. Neck pain - diclofenac (VOLTAREN) 75 MG EC tablet; Take 1 tablet (75 mg total) by mouth 2 (two) times daily.  Dispense: 60 tablet; Refill: 2  2. Primary osteoarthritis involving multiple joints Will stop mobic and try Voltaren, no other NSAID's Rest Ice ROM exercises encouraged - diclofenac (VOLTAREN) 75 MG EC tablet; Take 1 tablet (75 mg total) by mouth 2 (two) times daily.  Dispense: 60 tablet; Refill: 2  3. Drug-induced constipation Will stop linzess and start Amitiza  Force fluids Increase exercise and healthy diet - lubiprostone (AMITIZA) 24 MCG capsule; Take 1 capsule (24 mcg total) by mouth 2 (two) times daily with a meal.  Dispense: 60 capsule; Refill: 5    [name], FNP

## 2017-06-04 NOTE — Patient Instructions (Signed)

## 2017-07-16 ENCOUNTER — Other Ambulatory Visit: Payer: Self-pay | Admitting: Family

## 2017-07-22 DIAGNOSIS — R69 Illness, unspecified: Secondary | ICD-10-CM | POA: Diagnosis not present

## 2017-08-13 DIAGNOSIS — R69 Illness, unspecified: Secondary | ICD-10-CM | POA: Diagnosis not present

## 2017-08-16 DIAGNOSIS — M545 Low back pain: Secondary | ICD-10-CM | POA: Diagnosis not present

## 2017-08-16 DIAGNOSIS — G8921 Chronic pain due to trauma: Secondary | ICD-10-CM | POA: Diagnosis not present

## 2017-08-16 DIAGNOSIS — M79605 Pain in left leg: Secondary | ICD-10-CM | POA: Diagnosis not present

## 2017-08-16 DIAGNOSIS — M25512 Pain in left shoulder: Secondary | ICD-10-CM | POA: Diagnosis not present

## 2017-08-16 DIAGNOSIS — G8929 Other chronic pain: Secondary | ICD-10-CM | POA: Diagnosis not present

## 2017-08-16 DIAGNOSIS — R69 Illness, unspecified: Secondary | ICD-10-CM | POA: Diagnosis not present

## 2017-08-30 DIAGNOSIS — H524 Presbyopia: Secondary | ICD-10-CM | POA: Diagnosis not present

## 2017-08-30 DIAGNOSIS — H2513 Age-related nuclear cataract, bilateral: Secondary | ICD-10-CM | POA: Diagnosis not present

## 2017-09-03 DIAGNOSIS — R69 Illness, unspecified: Secondary | ICD-10-CM | POA: Diagnosis not present

## 2017-09-04 ENCOUNTER — Encounter: Payer: Self-pay | Admitting: Family

## 2017-09-04 ENCOUNTER — Other Ambulatory Visit: Payer: Self-pay | Admitting: Family

## 2017-09-04 ENCOUNTER — Ambulatory Visit (INDEPENDENT_AMBULATORY_CARE_PROVIDER_SITE_OTHER): Payer: Medicare HMO | Admitting: Family

## 2017-09-04 VITALS — BP 118/79 | HR 89 | Temp 98.0°F | Ht 66.0 in | Wt 214.0 lb

## 2017-09-04 DIAGNOSIS — F32A Depression, unspecified: Secondary | ICD-10-CM

## 2017-09-04 DIAGNOSIS — K5903 Drug induced constipation: Secondary | ICD-10-CM | POA: Diagnosis not present

## 2017-09-04 DIAGNOSIS — M542 Cervicalgia: Secondary | ICD-10-CM

## 2017-09-04 DIAGNOSIS — R69 Illness, unspecified: Secondary | ICD-10-CM | POA: Diagnosis not present

## 2017-09-04 DIAGNOSIS — G8929 Other chronic pain: Secondary | ICD-10-CM | POA: Diagnosis not present

## 2017-09-04 DIAGNOSIS — F411 Generalized anxiety disorder: Secondary | ICD-10-CM | POA: Diagnosis not present

## 2017-09-04 DIAGNOSIS — M25512 Pain in left shoulder: Secondary | ICD-10-CM | POA: Diagnosis not present

## 2017-09-04 DIAGNOSIS — M8949 Other hypertrophic osteoarthropathy, multiple sites: Secondary | ICD-10-CM

## 2017-09-04 DIAGNOSIS — M159 Polyosteoarthritis, unspecified: Secondary | ICD-10-CM

## 2017-09-04 DIAGNOSIS — F329 Major depressive disorder, single episode, unspecified: Secondary | ICD-10-CM

## 2017-09-04 DIAGNOSIS — M150 Primary generalized (osteo)arthritis: Secondary | ICD-10-CM

## 2017-09-04 MED ORDER — NALOXEGOL OXALATE 25 MG PO TABS: 25.0000 mg | ORAL_TABLET | Freq: Every day | ORAL | 2 refills | Status: DC

## 2017-09-04 MED ORDER — ESCITALOPRAM OXALATE 10 MG PO TABS: 10.0000 mg | ORAL_TABLET | Freq: Every day | ORAL | 3 refills | Status: DC

## 2017-09-04 NOTE — Patient Instructions (Signed)
Muscle Strain A muscle strain is an injury that occurs when a muscle is stretched beyond its normal length. Usually a small number of muscle fibers are torn when this happens. Muscle strain is rated in degrees. First-degree strains have the least amount of muscle fiber tearing and pain. Second-degree and third-degree strains have increasingly more tearing and pain. Usually, recovery from muscle strain takes 1-2 weeks. Complete healing takes 5-6 weeks. What are the causes? Muscle strain happens when a sudden, violent force placed on a muscle stretches it too far. This may occur with lifting, sports, or a fall. What increases the risk? Muscle strain is especially common in athletes. What are the signs or symptoms? At the site of the muscle strain, there may be:  Pain.  Bruising.  Swelling.  Difficulty using the muscle due to pain or lack of normal function.  How is this diagnosed? Your health care provider will perform a physical exam and ask about your medical history. How is this treated? Often, the best treatment for a muscle strain is resting, icing, and applying cold compresses to the injured area. Follow these instructions at home:  Use the PRICE method of treatment to promote muscle healing during the first 2-3 days after your injury. The PRICE method involves: ? Protecting the muscle from being injured again. ? Restricting your activity and resting the injured body part. ? Icing your injury. To do this, put ice in a plastic bag. Place a towel between your skin and the bag. Then, apply the ice and leave it on from 15-20 minutes each hour. After the third day, switch to moist heat packs. ? Apply compression to the injured area with a splint or elastic bandage. Be careful not to wrap it too tightly. This may interfere with blood circulation or increase swelling. ? Elevate the injured body part above the level of your heart as often as you can.  Only take over-the-counter or  prescription medicines for pain, discomfort, or fever as directed by your health care provider.  Warming up prior to exercise helps to prevent future muscle strains. Contact a health care provider if:  You have increasing pain or swelling in the injured area.  You have numbness, tingling, or a significant loss of strength in the injured area. This information is not intended to replace advice given to you by your health care provider. Make sure you discuss any questions you have with your health care provider. Document Released: 01/22/2005 Document Revised: 06/30/2015 Document Reviewed: 08/21/2012 Elsevier Interactive Patient Education  2017 Elsevier Inc.  
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
## 2017-09-11 ENCOUNTER — Ambulatory Visit: Payer: Medicare HMO | Attending: Family | Admitting: Physical Therapy

## 2017-09-11 ENCOUNTER — Other Ambulatory Visit: Payer: Self-pay

## 2017-09-11 ENCOUNTER — Encounter: Payer: Self-pay | Admitting: Physical Therapy

## 2017-09-11 DIAGNOSIS — M25561 Pain in right knee: Secondary | ICD-10-CM | POA: Insufficient documentation

## 2017-09-11 DIAGNOSIS — M542 Cervicalgia: Secondary | ICD-10-CM

## 2017-09-11 DIAGNOSIS — M25512 Pain in left shoulder: Secondary | ICD-10-CM | POA: Insufficient documentation

## 2017-09-11 DIAGNOSIS — G8929 Other chronic pain: Secondary | ICD-10-CM | POA: Diagnosis not present

## 2017-09-11 DIAGNOSIS — M25612 Stiffness of left shoulder, not elsewhere classified: Secondary | ICD-10-CM | POA: Diagnosis not present

## 2017-09-11 NOTE — Addendum Note (Signed)
Addended by: Guss BundeMANGAWANG, Annastacia Duba on: 09/11/2017 06:16 PM   Modules accepted: Orders
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
## 2017-09-11 NOTE — Patient Instructions (Signed)
   Jacion Dismore, PT, DPT [facility] [address] [place], [place], [address] Phone: [phone]   Fax:  [phone]  

## 2017-09-17 ENCOUNTER — Ambulatory Visit: Payer: Medicare HMO | Admitting: *Deleted

## 2017-09-17 DIAGNOSIS — G8929 Other chronic pain: Secondary | ICD-10-CM

## 2017-09-17 DIAGNOSIS — M25612 Stiffness of left shoulder, not elsewhere classified: Secondary | ICD-10-CM

## 2017-09-17 DIAGNOSIS — M25512 Pain in left shoulder: Secondary | ICD-10-CM | POA: Diagnosis not present

## 2017-09-17 DIAGNOSIS — M25561 Pain in right knee: Secondary | ICD-10-CM | POA: Diagnosis not present

## 2017-09-17 DIAGNOSIS — M542 Cervicalgia: Secondary | ICD-10-CM | POA: Diagnosis not present
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
## 2017-09-18 ENCOUNTER — Ambulatory Visit (INDEPENDENT_AMBULATORY_CARE_PROVIDER_SITE_OTHER): Payer: Medicare HMO

## 2017-09-18 ENCOUNTER — Encounter (INDEPENDENT_AMBULATORY_CARE_PROVIDER_SITE_OTHER): Payer: Self-pay | Admitting: Orthopaedic Surgery

## 2017-09-18 ENCOUNTER — Ambulatory Visit (INDEPENDENT_AMBULATORY_CARE_PROVIDER_SITE_OTHER): Payer: Medicare HMO | Admitting: Orthopaedic Surgery

## 2017-09-18 VITALS — BP 115/72 | HR 71 | Ht 72.0 in | Wt 200.0 lb

## 2017-09-18 DIAGNOSIS — G8929 Other chronic pain: Secondary | ICD-10-CM

## 2017-09-18 DIAGNOSIS — M542 Cervicalgia: Secondary | ICD-10-CM

## 2017-09-18 DIAGNOSIS — M25512 Pain in left shoulder: Secondary | ICD-10-CM | POA: Diagnosis not present

## 2017-09-18 MED ORDER — LIDOCAINE HCL 2 % IJ SOLN
2.0000 mL | INTRAMUSCULAR | Status: AC | PRN
  Administered 2017-09-18: 2 mL

## 2017-09-18 MED ORDER — METHYLPREDNISOLONE ACETATE 40 MG/ML IJ SUSP
80.0000 mg | INTRAMUSCULAR | Status: AC | PRN
  Administered 2017-09-18: 80 mg

## 2017-09-18 MED ORDER — BUPIVACAINE HCL 0.5 % IJ SOLN
2.0000 mL | INTRAMUSCULAR | Status: AC | PRN
  Administered 2017-09-18: 2 mL via INTRA_ARTICULAR
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
## 2017-09-19 ENCOUNTER — Encounter: Payer: Medicare HMO | Admitting: *Deleted

## 2017-09-24 ENCOUNTER — Ambulatory Visit: Payer: Medicare HMO | Admitting: Physical Therapy

## 2017-09-24 DIAGNOSIS — M542 Cervicalgia: Secondary | ICD-10-CM | POA: Diagnosis not present

## 2017-09-24 DIAGNOSIS — M25612 Stiffness of left shoulder, not elsewhere classified: Secondary | ICD-10-CM

## 2017-09-24 DIAGNOSIS — M25512 Pain in left shoulder: Principal | ICD-10-CM

## 2017-09-24 DIAGNOSIS — M25561 Pain in right knee: Secondary | ICD-10-CM | POA: Diagnosis not present

## 2017-09-24 DIAGNOSIS — R69 Illness, unspecified: Secondary | ICD-10-CM | POA: Diagnosis not present

## 2017-09-24 DIAGNOSIS — G8929 Other chronic pain: Secondary | ICD-10-CM | POA: Diagnosis not present

## 2017-09-24 NOTE — Therapy (Signed)
[facility] [address] [place], [place], [address] Phone: [phone]   Fax:  [phone]  Physical Therapy Treatment  Patient Details  Name: Molly Castaneda MRN: [id number] Date of Birth: 1960/09/09 Referring Provider: [name], FNP   Encounter Date: 09/24/2017  PT End of Session - 09/24/17 0901    Visit Number  3    Number of Visits  12    Date for PT Re-Evaluation  10/30/17    Authorization Type  Progress note every 10th visit.    PT Start Time  0900    PT Stop Time  0944    PT Time Calculation (min)  44 min    Activity Tolerance  Patient tolerated treatment well    Behavior During Therapy  WFL for tasks assessed/performed       Past Medical History:  Diagnosis Date  . Arthritis   . Chronic back pain   . Chronic constipation   . Depression   . MVP (mitral valve prolapse)    had ablation 1998  . Numbness in left leg    s/p spinal surgery  . Supraventricular tachycardia (HCC)    ablation 1998  . Vitamin D deficiency     Past Surgical History:  Procedure Laterality Date  . AV NODE ABLATION  1998   for svt  . BACK SURGERY  2001   lumbar fusion  . PARTIAL HYSTERECTOMY  1993  . REPAIR EXTENSOR TENDON Left 03/06/2013   Procedure: LEFT LONG BOUTONNIERE REPAIR;  Surgeon: Wyn Forster., MD;  Location: [facility];  Service: Orthopedics;  Laterality: Left;  . SPINE SURGERY    . stimulation system implant  07/31/00   lumbar nerve stimulator-none funtioning now    There were no vitals filed for this visit.  Subjective Assessment - 09/24/17 0912    Subjective  Patient reports sholder is "okay." Patient reported she recieved a shot at her ortho appointment to which she feels it helps with sleeping.    Pertinent History  chronic pain, depression, internal stimulator but "dead"    Limitations  House hold activities;Lifting    Diagnostic tests  X-Ray normal    Patient Stated Goals  sleep better, less  pain with dressing    Currently in Pain?  Yes    Pain Score  3     Pain Location  Shoulder    Pain Orientation  Left    Pain Descriptors / Indicators  Dull    Pain Type  Chronic pain    Pain Onset  More than a month ago    Pain Frequency  Constant                       [facility] Adult PT Treatment/Exercise - 09/24/17 0001      Exercises   Exercises  Shoulder      Shoulder Exercises: Standing   External Rotation  Strengthening;Right;10 reps;Theraband    Theraband Level (Shoulder External Rotation)  Level 1 (Yellow)    Extension  Strengthening;20 reps    Extension Limitations  Pink XTS    Row  Strengthening;Both;20 reps    Row Limitations  Pink XTS      Shoulder Exercises: ROM/Strengthening   UBE (Upper Arm Bike)  90 RPMs x 6 mins      Manual Therapy   Manual Therapy  Soft tissue mobilization    Soft tissue mobilization  IASTM to LT Utrap in sitting and then STW/TPR in  sitting to L UTrap and cerv paraspinals                  PT Long Term Goals - 09/11/17 1804      PT LONG TERM GOAL #1   Title  Patient will be independent with HEP    Time  6    Period  Weeks    Status  New      PT LONG TERM GOAL #2   Title  Patient will decreae pain while sleeping to 5/10 in L shoulder to improve quality of sleep.    Time  6    Period  Weeks    Status  New      PT LONG TERM GOAL #3   Title  Patient will improve left shoulder MMT in all planes to 4/5 or greater to improve ability to perform home activities.    Time  6    Period  Weeks    Status  New      PT LONG TERM GOAL #4   Title  Patient will demonstrate improve L shoulder flexion and abduction AROM to 170 or greater to improve ability to don/doff clothing overhead.     Time  6    Period  Weeks    Status  New      PT LONG TERM GOAL #5   Title  Patient will improve cervical rotation AROM to 60+ degrees in order to scan environment.    Time  6    Period  Weeks    Status  New            Plan  - 09/24/17 1102    Clinical Impression Statement  Patient was able to tolerate gentle exercises with minimal reports of pain. Patient was able to demonstrate good form with all exercises. Patient noted with increased L UT muscle tension with intermittent radiating pain up left cervical spine with deep trigger point release. Good response to STW/M at end of session. Patient instructed to use heat at home to alleviate pain. Patient reported agreement.     Clinical Presentation  Stable    Clinical Decision Making  Moderate    Rehab Potential  Fair    Clinical Impairments Affecting Rehab Potential  chronic pain    PT Frequency  2x / week    PT Duration  6 weeks    PT Treatment/Interventions  Cryotherapy;ADLs/Self Care Home Management;Moist Heat;Therapeutic exercise;Therapeutic activities;Balance training;Neuromuscular re-education;Patient/family education;Manual techniques;Passive range of motion    PT Next Visit Plan  UBE, upper trap stretch, levator scap stretch, STW/M to L UT moist heat for pain relief. no E-stim due to internal stimulator.   Ortho consult 09-18-17    Consulted and Agree with Plan of Care  Patient       Patient will benefit from skilled therapeutic intervention in order to improve the following deficits and impairments:  Pain, Impaired UE functional use, Decreased activity tolerance, Decreased range of motion, Decreased strength, Postural dysfunction  Visit Diagnosis: Chronic left shoulder pain  Stiffness of left shoulder, not elsewhere classified     Problem List Patient Active Problem List   Diagnosis Date Noted  . Chronic left shoulder pain 09/18/2017  . Cervicalgia 09/18/2017  . Obesity (BMI 30.0-34.9) 05/20/2017  . Peripheral edema 09/07/2015  . Vitamin D deficiency 06/22/2014  . Depression 06/21/2014  . GAD (generalized anxiety disorder) 06/21/2014  . Back pain 06/21/2014  . Osteopenia 02/11/2013  . Constipation 05/08/2010   Guss BundeKrystle Alexanderjames Berg, PT,  DPT 09/24/2017, 11:23 AM  [facility] [address] [place], [place], [address] Phone: [phone]   Fax:  [phone]  Name: Molly Castaneda MRN: [id number] Date of Birth: 1960/09/18

## 2017-09-26 ENCOUNTER — Ambulatory Visit: Payer: Medicare HMO | Admitting: *Deleted

## 2017-09-26 DIAGNOSIS — M25561 Pain in right knee: Secondary | ICD-10-CM | POA: Diagnosis not present

## 2017-09-26 DIAGNOSIS — M25612 Stiffness of left shoulder, not elsewhere classified: Secondary | ICD-10-CM | POA: Diagnosis not present

## 2017-09-26 DIAGNOSIS — G8929 Other chronic pain: Secondary | ICD-10-CM | POA: Diagnosis not present

## 2017-09-26 DIAGNOSIS — M25512 Pain in left shoulder: Principal | ICD-10-CM

## 2017-09-26 DIAGNOSIS — M542 Cervicalgia: Secondary | ICD-10-CM | POA: Diagnosis not present

## 2017-09-26 NOTE — Therapy (Signed)
[facility] [address] [place], [place], [address] Phone: [phone]   Fax:  [phone]  Physical Therapy Treatment  Patient Details  Name: Molly HarperShelia A Mozer MRN: [id number] Date of Birth: [birth year] Referring Provider: [name], FNP   Encounter Date: 09/26/2017  PT End of Session - 09/26/17 1813    Visit Number  4    Number of Visits  12    Date for PT Re-Evaluation  10/30/17    Authorization Type  Progress note every 10th visit.    PT Start Time  0900    PT Stop Time  0947    PT Time Calculation (min)  47 min       Past Medical History:  Diagnosis Date  . Arthritis   . Chronic back pain   . Chronic constipation   . Depression   . MVP (mitral valve prolapse)    had ablation 1998  . Numbness in left leg    s/p spinal surgery  . Supraventricular tachycardia (HCC)    ablation 1998  . Vitamin D deficiency     Past Surgical History:  Procedure Laterality Date  . AV NODE ABLATION  1998   for svt  . BACK SURGERY  2001   lumbar fusion  . PARTIAL HYSTERECTOMY  1993  . REPAIR EXTENSOR TENDON Left 03/06/2013   Procedure: LEFT LONG BOUTONNIERE REPAIR;  Surgeon: Wyn Forsterobert V Sypher Jr., MD;  Location: [facility];  Service: Orthopedics;  Laterality: Left;  . SPINE SURGERY    . stimulation system implant  07/31/00   lumbar nerve stimulator-none funtioning now    There were no vitals filed for this visit.  Subjective Assessment - 09/26/17 0912    Subjective  Patient reports sholder is "okay." Patient reported she recieved a shot at her ortho appointment to which she feels it helps with sleeping. 3/10 pain today    Pertinent History  chronic pain, depression, internal stimulator but "dead"    Limitations  House hold activities;Lifting    Diagnostic tests  X-Ray normal    Patient Stated Goals  sleep better, less pain with dressing    Currently in Pain?  Yes    Pain Score  3     Pain Location  Shoulder    Pain  Orientation  Left    Pain Descriptors / Indicators  Dull    Pain Onset  More than a month ago                       [facility]/Exercise - 09/26/17 0001      Exercises   Exercises  Shoulder      Shoulder Exercises: Standing   Protraction  Strengthening;Left;15 reps;Theraband    Theraband Level (Shoulder Protraction)  Level 1 (Yellow)    External Rotation  Strengthening;Right;10 reps;Theraband    Theraband Level (Shoulder External Rotation)  Level 1 (Yellow)    Internal Rotation  Right;Strengthening;15 reps;Theraband    Theraband Level (Shoulder Internal Rotation)  Level 1 (Yellow)    Extension  Strengthening;20 reps;Theraband;15 reps    State [address] Corporationow  Strengthening;20 reps;Theraband;Left;15 reps    Theraband Level (Shoulder Row)  Level 1 (Yellow)      Shoulder Exercises: ROM/Strengthening   UBE (Upper Arm Bike)  90 RPMs x 6 mins      Manual Therapy   Manual Therapy  Soft tissue mobilization    Soft tissue mobilization  IASTM to LT Utrap in sitting and then  STW/TPR in sitting to L UTrap and cerv paraspinals                  PT Long Term Goals - 09/11/17 1804      PT LONG TERM GOAL #1   Title  Patient will be independent with HEP    Time  6    Period  Weeks    Status  New      PT LONG TERM GOAL #2   Title  Patient will decreae pain while sleeping to 5/10 in L shoulder to improve quality of sleep.    Time  6    Period  Weeks    Status  New      PT LONG TERM GOAL #3   Title  Patient will improve left shoulder MMT in all planes to 4/5 or greater to improve ability to perform home activities.    Time  6    Period  Weeks    Status  New      PT LONG TERM GOAL #4   Title  Patient will demonstrate improve L shoulder flexion and abduction AROM to 170 or greater to improve ability to don/doff clothing overhead.     Time  6    Period  Weeks    Status  New      PT LONG TERM GOAL #5   Title  Patient will improve cervical rotation AROM to 60+  degrees in order to scan environment.    Time  6    Period  Weeks    Status  New            Plan - 09/26/17 1816    Clinical Impression Statement  Pt arrived tody with pain 2-3/10 LT shldr and Utrap and reports shldr injection has helped with decreased pain at night and sleep has improved. Pt continues to have tightness and TPs noted in LT Utrap and Levator as well as cervicalparas.. Pt had some decrease in TP size , but unable to get full release.    Clinical Presentation  Stable    Clinical Decision Making  Moderate    Rehab Potential  Fair    Clinical Impairments Affecting Rehab Potential  chronic pain    PT Frequency  2x / week    PT Duration  6 weeks    PT Treatment/Interventions  Cryotherapy;ADLs/Self Care Home Management;Moist Heat;Therapeutic exercise;Therapeutic activities;Balance training;Neuromuscular re-education;Patient/family education;Manual techniques;Passive range of motion    PT Next Visit Plan  UBE, upper trap stretch, levator scap stretch, STW/M to L UT moist heat for pain relief. no E-stim due to internal stimulator.   Ortho consult 09-18-17    Consulted and Agree with Plan of Care  Patient       Patient will benefit from skilled therapeutic intervention in order to improve the following deficits and impairments:  Pain, Impaired UE functional use, Decreased activity tolerance, Decreased range of motion, Decreased strength, Postural dysfunction  Visit Diagnosis: Chronic left shoulder pain  Stiffness of left shoulder, not elsewhere classified  Cervicalgia  Chronic pain of right knee     Problem List Patient Active Problem List   Diagnosis Date Noted  . Chronic left shoulder pain 09/18/2017  . Cervicalgia 09/18/2017  . Obesity (BMI 30.0-34.9) 05/20/2017  . Peripheral edema 09/07/2015  . Vitamin D deficiency 06/22/2014  . Depression 06/21/2014  . GAD (generalized anxiety disorder) 06/21/2014  . Back pain 06/21/2014  . Osteopenia 02/11/2013  .  Constipation 05/08/2010  RAMSEUR,CHRIS, PTA 09/26/2017, 6:20 PM  [facility] [address] [place], [place], [address] Phone: [phone]   Fax:  [phone]  Name: ADALENA ABDULLA MRN: [id number] Date of Birth: [birth year]

## 2017-10-01 ENCOUNTER — Encounter: Payer: Self-pay | Admitting: *Deleted

## 2017-10-01 ENCOUNTER — Ambulatory Visit: Payer: Medicare HMO | Admitting: *Deleted

## 2017-10-01 DIAGNOSIS — M25512 Pain in left shoulder: Principal | ICD-10-CM

## 2017-10-01 DIAGNOSIS — G8929 Other chronic pain: Secondary | ICD-10-CM

## 2017-10-01 DIAGNOSIS — M25612 Stiffness of left shoulder, not elsewhere classified: Secondary | ICD-10-CM

## 2017-10-01 DIAGNOSIS — M542 Cervicalgia: Secondary | ICD-10-CM | POA: Diagnosis not present

## 2017-10-01 DIAGNOSIS — M25561 Pain in right knee: Secondary | ICD-10-CM

## 2017-10-01 NOTE — Therapy (Signed)
[facility] [address] [place], [place], [address] Phone: [phone]   Fax:  [phone]  Physical Therapy Treatment  Patient Details  Name: Molly Castaneda MRN: [id number] Date of Birth: 07/28/1960 Referring Provider: [name], FNP   Encounter Date: 10/01/2017  PT End of Session - 10/01/17 0949    Visit Number  5    Number of Visits  12    Date for PT Re-Evaluation  10/30/17    Authorization Type  Progress note every 10th visit.    PT Start Time  0900    PT Stop Time  0948    PT Time Calculation (min)  48 min       Past Medical History:  Diagnosis Date  . Arthritis   . Chronic back pain   . Chronic constipation   . Depression   . MVP (mitral valve prolapse)    had ablation 1998  . Numbness in left leg    s/p spinal surgery  . Supraventricular tachycardia (HCC)    ablation 1998  . Vitamin D deficiency     Past Surgical History:  Procedure Laterality Date  . AV NODE ABLATION  1998   for svt  . BACK SURGERY  2001   lumbar fusion  . PARTIAL HYSTERECTOMY  1993  . REPAIR EXTENSOR TENDON Left 03/06/2013   Procedure: LEFT LONG BOUTONNIERE REPAIR;  Surgeon: Wyn Forster., MD;  Location: [facility];  Service: Orthopedics;  Laterality: Left;  . SPINE SURGERY    . stimulation system implant  07/31/00   lumbar nerve stimulator-none funtioning now    There were no vitals filed for this visit.  Subjective Assessment - 10/01/17 0913    Subjective  Pain at night is returning. Shot is wearing off or just the weather. R  (Pended)     Pertinent History  chronic pain, depression, internal stimulator but "dead"    Limitations  House hold activities;Lifting    Diagnostic tests  X-Ray normal    Patient Stated Goals  sleep better, less pain with dressing    Currently in Pain?  Yes    Pain Score  3     Pain Location  Shoulder    Pain Orientation  Left    Pain Descriptors / Indicators  Dull    Pain Type   Chronic pain    Pain Onset  More than a month ago    Pain Frequency  Constant                       [facility] Adult PT Treatment/Exercise - 10/01/17 0001      Exercises   Exercises  Shoulder      Shoulder Exercises: Standing   Protraction  Strengthening;Left;15 reps;Theraband    Theraband Level (Shoulder Protraction)  Level 1 (Yellow)    External Rotation  Strengthening;Right;15 reps    Theraband Level (Shoulder External Rotation)  Level 1 (Yellow)    Internal Rotation  15 reps    Theraband Level (Shoulder Internal Rotation)  Level 1 (Yellow)    Extension  Strengthening;20 reps;Theraband    Theraband Level (Shoulder Extension)  Level 1 (Yellow)    Row  Strengthening;20 reps;Theraband;Left;10 reps    Theraband Level (Shoulder Row)  Level 1 (Yellow)      Shoulder Exercises: Pulleys   Other Pulley Exercises  Standing UE ranger AAROM circles each way 2x10      Shoulder Exercises: ROM/Strengthening   UBE (Upper  Arm Bike)  90 RPMs x 8 mins      Manual Therapy   Manual Therapy  Soft tissue mobilization    Soft tissue mobilization  IASTM to LT Utrap/ Levator  in sitting and then STW/TPR in sitting to L UTrap and cerv paraspinals                  PT Long Term Goals - 09/11/17 1804      PT LONG TERM GOAL #1   Title  Patient will be independent with HEP    Time  6    Period  Weeks    Status  New      PT LONG TERM GOAL #2   Title  Patient will decreae pain while sleeping to 5/10 in L shoulder to improve quality of sleep.    Time  6    Period  Weeks    Status  New      PT LONG TERM GOAL #3   Title  Patient will improve left shoulder MMT in all planes to 4/5 or greater to improve ability to perform home activities.    Time  6    Period  Weeks    Status  New      PT LONG TERM GOAL #4   Title  Patient will demonstrate improve L shoulder flexion and abduction AROM to 170 or greater to improve ability to don/doff clothing overhead.     Time  6    Period   Weeks    Status  New      PT LONG TERM GOAL #5   Title  Patient will improve cervical rotation AROM to 60+ degrees in order to scan environment.    Time  6    Period  Weeks    Status  New            Plan - 10/01/17 1304    Clinical Impression Statement  Pt arrived today doing fair , but reports that her shldr started aching again last night , but hpes that it is the weather and not the injection wearing off. She was able to perform light strengthening in LT shldr and was able to perform more reps. Still with notable tension/tautness in LT Utrap and levator during STW/ IASTM. Decreased tautness after STW.     Clinical Presentation  Stable    Clinical Decision Making  Moderate    Rehab Potential  Fair    Clinical Impairments Affecting Rehab Potential  chronic pain    PT Frequency  2x / week    PT Duration  6 weeks    PT Treatment/Interventions  Cryotherapy;ADLs/Self Care Home Management;Moist Heat;Therapeutic exercise;Therapeutic activities;Balance training;Neuromuscular re-education;Patient/family education;Manual techniques;Passive range of motion    PT Next Visit Plan  UBE, upper trap stretch, levator scap stretch, STW/M to L UT moist heat for pain relief.     Recert to add US and Estim    Consulted and Agree with Plan of Care  Patient       Patient will benefit from skilled therapeutic intervention in order to improve the following deficits and impairments:  Pain, Impaired UE functional use, Decreased activity tolerance, Decreased range of motion, Decreased strength, Postural dysfunction  Visit Diagnosis: Chronic left shoulder pain  Stiffness of left shoulder, not elsewhere classified  Cervicalgia  Chronic pain of right knee     Problem List Patient Active Problem List   Diagnosis Date Noted  . Chronic left shoulder pain 09/18/2017  .  Cervicalgia 09/18/2017  . Obesity (BMI 30.0-34.9) 05/20/2017  . Peripheral edema 09/07/2015  . Vitamin D deficiency 06/22/2014  .  Depression 06/21/2014  . GAD (generalized anxiety disorder) 06/21/2014  . Back pain 06/21/2014  . Osteopenia 02/11/2013  . Constipation 05/08/2010    Valentine Kuechle,CHRIS, PTA 10/01/2017, 1:17 PM  [facility] [address] [place], [place], [address] Phone: [phone]   Fax:  [phone]  Name: Molly Castaneda MRN: [id number] Date of Birth: 07-28-1960

## 2017-10-03 ENCOUNTER — Encounter: Payer: Medicare HMO | Admitting: *Deleted

## 2017-10-04 ENCOUNTER — Other Ambulatory Visit: Payer: Self-pay | Admitting: Family

## 2017-10-04 DIAGNOSIS — M150 Primary generalized (osteo)arthritis: Secondary | ICD-10-CM

## 2017-10-04 DIAGNOSIS — M542 Cervicalgia: Secondary | ICD-10-CM

## 2017-10-04 DIAGNOSIS — M159 Polyosteoarthritis, unspecified: Secondary | ICD-10-CM

## 2017-10-04 DIAGNOSIS — M8949 Other hypertrophic osteoarthropathy, multiple sites: Secondary | ICD-10-CM

## 2017-10-08 ENCOUNTER — Ambulatory Visit: Payer: Medicare HMO | Attending: Family | Admitting: *Deleted

## 2017-10-08 DIAGNOSIS — M25512 Pain in left shoulder: Secondary | ICD-10-CM | POA: Diagnosis not present

## 2017-10-08 DIAGNOSIS — M25612 Stiffness of left shoulder, not elsewhere classified: Secondary | ICD-10-CM | POA: Insufficient documentation

## 2017-10-08 DIAGNOSIS — M542 Cervicalgia: Secondary | ICD-10-CM

## 2017-10-08 DIAGNOSIS — G8929 Other chronic pain: Secondary | ICD-10-CM | POA: Diagnosis not present

## 2017-10-08 DIAGNOSIS — M25561 Pain in right knee: Secondary | ICD-10-CM | POA: Insufficient documentation

## 2017-10-08 NOTE — Therapy (Addendum)
[facility] [address] [place], [place], [address] Phone: [phone]   Fax:  [phone]  Physical Therapy Treatment  Patient Details  Name: Molly Castaneda MRN: [id number] Date of Birth: 12-06-1960 Referring Provider: [name], FNP   Encounter Date: 10/08/2017  PT End of Session - 10/08/17 1003    Visit Number  6    Number of Visits  12    Date for PT Re-Evaluation  10/30/17    Authorization Type  Progress note every 10th visit.    PT Start Time  0900    PT Stop Time  0950    PT Time Calculation (min)  50 min       Past Medical History:  Diagnosis Date  . Arthritis   . Chronic back pain   . Chronic constipation   . Depression   . MVP (mitral valve prolapse)    had ablation 1998  . Numbness in left leg    s/p spinal surgery  . Supraventricular tachycardia (HCC)    ablation 1998  . Vitamin D deficiency     Past Surgical History:  Procedure Laterality Date  . AV NODE ABLATION  1998   for svt  . BACK SURGERY  2001   lumbar fusion  . PARTIAL HYSTERECTOMY  1993  . REPAIR EXTENSOR TENDON Left 03/06/2013   Procedure: LEFT LONG BOUTONNIERE REPAIR;  Surgeon: Wyn Forster., MD;  Location: [facility];  Service: Orthopedics;  Laterality: Left;  . SPINE SURGERY    . stimulation system implant  07/31/00   lumbar nerve stimulator-none funtioning now    There were no vitals filed for this visit.  Subjective Assessment - 10/08/17 0913    Subjective  Doing better. 2/10 today    Pertinent History  chronic pain, depression, internal stimulator but "dead"    Limitations  House hold activities;Lifting    Diagnostic tests  X-Ray normal    Patient Stated Goals  sleep better, less pain with dressing    Currently in Pain?  Yes    Pain Score  2     Pain Orientation  Left    Pain Type  Chronic pain    Pain Onset  More than a month ago    Pain Frequency  Constant      Rx     UBE x 8 mins, RW 4  Red tband, [place]  estim combo LT Utrap x12 mins 1.5 w/cm2x 12 mins, Estim IFC x 15 mins with HMP                              PT Long Term Goals - 09/11/17 1804      PT LONG TERM GOAL #1   Title  Patient will be independent with HEP    Time  6    Period  Weeks    Status  New      PT LONG TERM GOAL #2   Title  Patient will decreae pain while sleeping to 5/10 in L shoulder to improve quality of sleep.    Time  6    Period  Weeks    Status  New      PT LONG TERM GOAL #3   Title  Patient will improve left shoulder MMT in all planes to 4/5 or greater to improve ability to perform home activities.    Time  6    Period  Weeks  Status  New      PT LONG TERM GOAL #4   Title  Patient will demonstrate improve L shoulder flexion and abduction AROM to 170 or greater to improve ability to don/doff clothing overhead.     Time  6    Period  Weeks    Status  New      PT LONG TERM GOAL #5   Title  Patient will improve cervical rotation AROM to 60+ degrees in order to scan environment.    Time  6    Period  Weeks    Status  New            Plan - 10/08/17 1008    Clinical Impression Statement  Pt arrived today doing a little better with pain only 2/10 and was able to sleep better. [place] combo  and Estim was approved and started today and Pt did very well with complications. Decreased pain end of Rx    Clinical Presentation  Stable    Clinical Decision Making  Moderate    Rehab Potential  Fair    Clinical Impairments Affecting Rehab Potential  chronic pain    PT Treatment/Interventions  Cryotherapy;ADLs/Self Care Home Management;Moist Heat;Therapeutic exercise;Therapeutic activities;Balance training;Neuromuscular re-education;Patient/family education;Manual techniques;Passive range of motion;Ultrasound;Electrical Stimulation    PT Next Visit Plan  UBE, upper trap stretch, levator scap stretch, STW/M to L UT moist heat for pain relief.     Recert to add [place] and Estim approved     Consulted and Agree with Plan of Care  Patient       Patient will benefit from skilled therapeutic intervention in order to improve the following deficits and impairments:  Pain, Impaired UE functional use, Decreased activity tolerance, Decreased range of motion, Decreased strength, Postural dysfunction  Visit Diagnosis: Chronic left shoulder pain  Stiffness of left shoulder, not elsewhere classified  Cervicalgia  Chronic pain of right knee     Problem List Patient Active Problem List   Diagnosis Date Noted  . Chronic left shoulder pain 09/18/2017  . Cervicalgia 09/18/2017  . Obesity (BMI 30.0-34.9) 05/20/2017  . Peripheral edema 09/07/2015  . Vitamin D deficiency 06/22/2014  . Depression 06/21/2014  . GAD (generalized anxiety disorder) 06/21/2014  . Back pain 06/21/2014  . Osteopenia 02/11/2013  . Constipation 05/08/2010    Vianne Grieshop,CHRIS, PTA 10/08/2017, 10:41 AM  [facility] [address] [place], [place], [address] Phone: [phone]   Fax:  [phone]  Name: TIMIKO OFFUTT MRN: [id number] Date of Birth: 08/06/1960

## 2017-10-10 ENCOUNTER — Ambulatory Visit: Payer: Medicare HMO | Admitting: *Deleted

## 2017-10-10 DIAGNOSIS — M25612 Stiffness of left shoulder, not elsewhere classified: Secondary | ICD-10-CM

## 2017-10-10 DIAGNOSIS — M25512 Pain in left shoulder: Secondary | ICD-10-CM | POA: Diagnosis not present

## 2017-10-10 DIAGNOSIS — G8929 Other chronic pain: Secondary | ICD-10-CM

## 2017-10-10 DIAGNOSIS — M542 Cervicalgia: Secondary | ICD-10-CM

## 2017-10-10 DIAGNOSIS — M25561 Pain in right knee: Secondary | ICD-10-CM | POA: Diagnosis not present

## 2017-10-10 NOTE — Therapy (Deleted)
[facility] [address] [place], [place], [address] Phone: [phone]   Fax:  [phone]  Physical Therapy Treatment  Patient Details  Name: Molly Castaneda MRN: [id number] Date of Birth: 1960/04/17 Referring Provider: [name], FNP   Encounter Date: 10/08/2017    Past Medical History:  Diagnosis Date  . Arthritis   . Chronic back pain   . Chronic constipation   . Depression   . MVP (mitral valve prolapse)    had ablation 1998  . Numbness in left leg    s/p spinal surgery  . Supraventricular tachycardia (HCC)    ablation 1998  . Vitamin D deficiency     Past Surgical History:  Procedure Laterality Date  . AV NODE ABLATION  1998   for svt  . BACK SURGERY  2001   lumbar fusion  . PARTIAL HYSTERECTOMY  1993  . REPAIR EXTENSOR TENDON Left 03/06/2013   Procedure: LEFT LONG BOUTONNIERE REPAIR;  Surgeon: Wyn Forster., MD;  Location: [facility];  Service: Orthopedics;  Laterality: Left;  . SPINE SURGERY    . stimulation system implant  07/31/00   lumbar nerve stimulator-none funtioning now    There were no vitals filed for this visit.                                 PT Long Term Goals - 10/10/17 1324      PT LONG TERM GOAL #1   Title  Patient will be independent with HEP    Period  Weeks    Status  On-going      PT LONG TERM GOAL #2   Title  Patient will decreae pain while sleeping to 5/10 in L shoulder to improve quality of sleep.    Time  6    Period  Weeks    Status  Achieved      PT LONG TERM GOAL #3   Title  Patient will improve left shoulder MMT in all planes to 4/5 or greater to improve ability to perform home activities.    Time  6    Period  Weeks    Status  On-going              Patient will benefit from skilled therapeutic intervention in order to improve the following deficits and impairments:  Pain, Impaired UE functional use, Decreased  activity tolerance, Decreased range of motion, Decreased strength, Postural dysfunction  Visit Diagnosis: Chronic left shoulder pain  Stiffness of left shoulder, not elsewhere classified  Cervicalgia  Chronic pain of right knee     Problem List Patient Active Problem List   Diagnosis Date Noted  . Chronic left shoulder pain 09/18/2017  . Cervicalgia 09/18/2017  . Obesity (BMI 30.0-34.9) 05/20/2017  . Peripheral edema 09/07/2015  . Vitamin D deficiency 06/22/2014  . Depression 06/21/2014  . GAD (generalized anxiety disorder) 06/21/2014  . Back pain 06/21/2014  . Osteopenia 02/11/2013  . Constipation 05/08/2010    Teighlor Korson,CHRIS 10/10/2017, 6:46 PM  [facility] [address] [place], [place], [address] Phone: [phone]   Fax:  [phone]  Name: Molly Castaneda MRN: [id number] Date of Birth: [birth year]

## 2017-10-10 NOTE — Therapy (Signed)
[facility] [address] [place], [place], [address] Phone: [phone]   Fax:  [phone]  Physical Therapy Treatment  Patient Details  Name: Molly Castaneda MRN: [id number] Date of Birth: 12/24/1960 Referring Provider: [name], FNP   Encounter Date: 10/10/2017  PT End of Session - 10/10/17 1012    Visit Number  7    Number of Visits  12    Date for PT Re-Evaluation  10/30/17    Authorization Type  Progress note every 10th visit.    PT Start Time  0900    PT Stop Time  0950    PT Time Calculation (min)  50 min       Past Medical History:  Diagnosis Date  . Arthritis   . Chronic back pain   . Chronic constipation   . Depression   . MVP (mitral valve prolapse)    had ablation 1998  . Numbness in left leg    s/p spinal surgery  . Supraventricular tachycardia (HCC)    ablation 1998  . Vitamin D deficiency     Past Surgical History:  Procedure Laterality Date  . AV NODE ABLATION  1998   for svt  . BACK SURGERY  2001   lumbar fusion  . PARTIAL HYSTERECTOMY  1993  . REPAIR EXTENSOR TENDON Left 03/06/2013   Procedure: LEFT LONG BOUTONNIERE REPAIR;  Surgeon: Wyn Forster., MD;  Location: [facility];  Service: Orthopedics;  Laterality: Left;  . SPINE SURGERY    . stimulation system implant  07/31/00   lumbar nerve stimulator-none funtioning now    There were no vitals filed for this visit.  Subjective Assessment - 10/10/17 0920    Subjective  Sore last night 3-4/10 . 2/10 today  (Pended)     Pertinent History  chronic pain, depression, internal stimulator but "dead"  (Pended)     Limitations  House hold activities;Lifting  (Pended)     Diagnostic tests  X-Ray normal  (Pended)     Patient Stated Goals  sleep better, less pain with dressing  (Pended)     Currently in Pain?  Yes  (Pended)     Pain Score  2   (Pended)     Pain Location  Shoulder  (Pended)     Pain Orientation  Left  (Pended)     Pain Descriptors / Indicators  Dull  (Pended)                        [facility] Adult PT Treatment/Exercise - 10/10/17 0001      Exercises   Exercises  Shoulder      Shoulder Exercises: Pulleys   Other Pulley Exercises  Standing UE ranger AAROM circles each way 3 x10      Shoulder Exercises: ROM/Strengthening   UBE (Upper Arm Bike)  90 RPMs x 8 mins      Modalities   Modalities  Electrical Stimulation;Moist Heat;Ultrasound      Moist Heat Therapy   Number Minutes Moist Heat  15 Minutes    Moist Heat Location  Shoulder      Electrical Stimulation   Electrical Stimulation Location  LT Utrap  IFC  80-150  x 12 mins    Electrical Stimulation Goals  Pain      Ultrasound   Ultrasound Location  LT Utrap    Ultrasound Parameters  1.5 w/cm2 x 12 mins    Ultrasound Goals  Pain  PT Long Term Goals - 10/10/17 8295      PT LONG TERM GOAL #1   Title  Patient will be independent with HEP    Period  Weeks    Status  On-going      PT LONG TERM GOAL #2   Title  Patient will decreae pain while sleeping to 5/10 in L shoulder to improve quality of sleep.    Time  6    Period  Weeks    Status  Achieved      PT LONG TERM GOAL #3   Title  Patient will improve left shoulder MMT in all planes to 4/5 or greater to improve ability to perform home activities.    Time  6    Period  Weeks    Status  On-going            Plan - 10/10/17 1013    Clinical Impression Statement  Pt reports beinng a little sore after last Rx , but 2/10 today. She reports pain at night is2-4/10 now and was able to meet LTG. She was able to perform thererex for LT UE with increased sets with mainly fatigue. Good response to Rx with decreased tautness in LT Utrap. Normal modality response.    Clinical Decision Making  Moderate    Clinical Impairments Affecting Rehab Potential  chronic pain    PT Frequency  2x / week    PT Duration  6 weeks    PT Treatment/Interventions   Cryotherapy;ADLs/Self Care Home Management;Moist Heat;Therapeutic exercise;Therapeutic activities;Balance training;Neuromuscular re-education;Patient/family education;Manual techniques;Passive range of motion;Ultrasound;Electrical Stimulation    PT Next Visit Plan  UBE, upper trap stretch, levator scap stretch, STW/M to L UT moist heat for pain relief.     Recert to add [place] and Estim approved    Consulted and Agree with Plan of Care  Patient       Patient will benefit from skilled therapeutic intervention in order to improve the following deficits and impairments:  Pain, Impaired UE functional use, Decreased activity tolerance, Decreased range of motion, Decreased strength, Postural dysfunction  Visit Diagnosis: Chronic left shoulder pain  Stiffness of left shoulder, not elsewhere classified  Cervicalgia     Problem List Patient Active Problem List   Diagnosis Date Noted  . Chronic left shoulder pain 09/18/2017  . Cervicalgia 09/18/2017  . Obesity (BMI 30.0-34.9) 05/20/2017  . Peripheral edema 09/07/2015  . Vitamin D deficiency 06/22/2014  . Depression 06/21/2014  . GAD (generalized anxiety disorder) 06/21/2014  . Back pain 06/21/2014  . Osteopenia 02/11/2013  . Constipation 05/08/2010    RAMSEUR,CHRIS , PTA 10/10/2017, 12:56 PM  [facility] [address] [place], [place], [address] Phone: [phone]   Fax:  [phone]  Name: Molly Castaneda MRN: [id number] Date of Birth: May 21, 1960

## 2017-10-15 ENCOUNTER — Ambulatory Visit: Payer: Medicare HMO | Admitting: *Deleted

## 2017-10-15 ENCOUNTER — Encounter: Payer: Self-pay | Admitting: *Deleted

## 2017-10-15 DIAGNOSIS — M25512 Pain in left shoulder: Principal | ICD-10-CM

## 2017-10-15 DIAGNOSIS — M25561 Pain in right knee: Secondary | ICD-10-CM | POA: Diagnosis not present

## 2017-10-15 DIAGNOSIS — G8929 Other chronic pain: Secondary | ICD-10-CM

## 2017-10-15 DIAGNOSIS — M25612 Stiffness of left shoulder, not elsewhere classified: Secondary | ICD-10-CM | POA: Diagnosis not present

## 2017-10-15 DIAGNOSIS — M542 Cervicalgia: Secondary | ICD-10-CM | POA: Diagnosis not present

## 2017-10-15 NOTE — Therapy (Signed)
[facility] [address] [place], [place], [address] Phone: [phone]   Fax:  [phone]  Physical Therapy Treatment  Patient Details  Name: Molly Castaneda MRN: [id number] Date of Birth: Feb 20, 1960 Referring Provider: [name], FNP   Encounter Date: 10/15/2017  PT End of Session - 10/15/17 0918    Visit Number  8    Number of Visits  12    Date for PT Re-Evaluation  10/30/17    Authorization Type  Progress note every 10th visit.    PT Start Time  0900    PT Stop Time  0952    PT Time Calculation (min)  52 min       Past Medical History:  Diagnosis Date  . Arthritis   . Chronic back pain   . Chronic constipation   . Depression   . MVP (mitral valve prolapse)    had ablation 1998  . Numbness in left leg    s/p spinal surgery  . Supraventricular tachycardia (HCC)    ablation 1998  . Vitamin D deficiency     Past Surgical History:  Procedure Laterality Date  . AV NODE ABLATION  1998   for svt  . BACK SURGERY  2001   lumbar fusion  . PARTIAL HYSTERECTOMY  1993  . REPAIR EXTENSOR TENDON Left 03/06/2013   Procedure: LEFT LONG BOUTONNIERE REPAIR;  Surgeon: Wyn Forster., MD;  Location: [facility];  Service: Orthopedics;  Laterality: Left;  . SPINE SURGERY    . stimulation system implant  07/31/00   lumbar nerve stimulator-none funtioning now    There were no vitals filed for this visit.  Subjective Assessment - 10/15/17 0915    Subjective  LT trap is tightening up again, but pain is low 3/10. Rxs are helping    Pertinent History  chronic pain, depression, internal stimulator but "dead"    Limitations  House hold activities;Lifting    Diagnostic tests  X-Ray normal    Patient Stated Goals  sleep better, less pain with dressing    Currently in Pain?  Yes    Pain Score  3     Pain Orientation  Left    Pain Descriptors / Indicators  Dull    Pain Type  Chronic pain    Pain Onset  More than a  month ago    Pain Frequency  Constant                       [facility] Adult PT Treatment/Exercise - 10/15/17 0001      Exercises   Exercises  Shoulder      Shoulder Exercises: Standing   External Rotation  Strengthening;Right;20 reps    Theraband Level (Shoulder External Rotation)  Level 2 (Red)    Internal Rotation  20 reps    Theraband Level (Shoulder Internal Rotation)  Level 2 (Red)      Shoulder Exercises: ROM/Strengthening   UBE (Upper Arm Bike)  90 RPMs x 8 mins      Modalities   Modalities  Electrical Stimulation;Moist Heat;Ultrasound      Moist Heat Therapy   Number Minutes Moist Heat  15 Minutes    Moist Heat Location  Shoulder      Electrical Stimulation   Electrical Stimulation Location  LT Utrap  IFC  80-150  x 12 mins    Electrical Stimulation Goals  Pain      Ultrasound   Ultrasound Location  LT trap    Ultrasound Parameters  1.5 w/cm2 x 12 mins    Ultrasound Goals  Pain      Manual Therapy   Manual Therapy  Soft tissue mobilization    Soft tissue mobilization  TPR to LT Utrap                  PT Long Term Goals - 10/10/17 7867      PT LONG TERM GOAL #1   Title  Patient will be independent with HEP    Period  Weeks    Status  On-going      PT LONG TERM GOAL #2   Title  Patient will decreae pain while sleeping to 5/10 in L shoulder to improve quality of sleep.    Time  6    Period  Weeks    Status  Achieved      PT LONG TERM GOAL #3   Title  Patient will improve left shoulder MMT in all planes to 4/5 or greater to improve ability to perform home activities.    Time  6    Period  Weeks    Status  On-going            Plan - 10/15/17 1002    Clinical Impression Statement  Pt arrived today doing fairly well with low pain levels 3/10 LT Utrap and neck. She was able to perform all therex with minimalincrease in pain and mainly fatigue. She did well with overall Rx  and had decreased pain and tightness LT Utrap. Normal  modality response today.    Clinical Presentation  Stable    Clinical Decision Making  Moderate    Rehab Potential  Fair    PT Frequency  2x / week    PT Duration  6 weeks    PT Treatment/Interventions  Cryotherapy;ADLs/Self Care Home Management;Moist Heat;Therapeutic exercise;Therapeutic activities;Balance training;Neuromuscular re-education;Patient/family education;Manual techniques;Passive range of motion;Ultrasound;Electrical Stimulation    PT Next Visit Plan  UBE, upper trap stretch, levator scap stretch, STW/M to L UT moist heat for pain relief.     Recert to add [place] and Estim approved    Consulted and Agree with Plan of Care  Patient       Patient will benefit from skilled therapeutic intervention in order to improve the following deficits and impairments:  Pain, Impaired UE functional use, Decreased activity tolerance, Decreased range of motion, Decreased strength, Postural dysfunction  Visit Diagnosis: Chronic left shoulder pain  Stiffness of left shoulder, not elsewhere classified  Cervicalgia     Problem List Patient Active Problem List   Diagnosis Date Noted  . Chronic left shoulder pain 09/18/2017  . Cervicalgia 09/18/2017  . Obesity (BMI 30.0-34.9) 05/20/2017  . Peripheral edema 09/07/2015  . Vitamin D deficiency 06/22/2014  . Depression 06/21/2014  . GAD (generalized anxiety disorder) 06/21/2014  . Back pain 06/21/2014  . Osteopenia 02/11/2013  . Constipation 05/08/2010    Milinda Sweeney,CHRIS, PTA 10/15/2017, 10:34 AM  [facility] [address] [place], [place], [address] Phone: [phone]   Fax:  [phone]  Name: JONICA PHILLIP MRN: [id number] Date of Birth: 04-29-1960

## 2017-10-16 ENCOUNTER — Ambulatory Visit (INDEPENDENT_AMBULATORY_CARE_PROVIDER_SITE_OTHER): Payer: Medicare HMO | Admitting: Orthopaedic Surgery

## 2017-10-16 ENCOUNTER — Encounter (INDEPENDENT_AMBULATORY_CARE_PROVIDER_SITE_OTHER): Payer: Self-pay | Admitting: Orthopaedic Surgery

## 2017-10-16 VITALS — BP 114/69 | HR 87 | Ht 72.0 in | Wt 185.0 lb

## 2017-10-16 DIAGNOSIS — M25512 Pain in left shoulder: Secondary | ICD-10-CM

## 2017-10-16 DIAGNOSIS — G8929 Other chronic pain: Secondary | ICD-10-CM | POA: Diagnosis not present

## 2017-10-16 NOTE — Progress Notes (Signed)
Office Visit Note   Patient: Molly Castaneda           Date of Birth: May 01, 1960           MRN: [id number] Visit Date: 10/16/2017              Requested by: [name], FNP [address] [place], [place] [address] PCP: [name], FNP   Assessment & Plan: Visit Diagnoses:  1. Chronic left shoulder pain     Plan: MRI left  Shoulder with persistent pain  Follow-Up Instructions: Return after MRI left shoulder.   Orders:  No orders of the defined types were placed in this encounter.  No orders of the defined types were placed in this encounter.     Procedures: No procedures performed   Clinical Data: No additional findings.   Subjective: Chief Complaint  Patient presents with  . Follow-up    L SHOULDER INJECTION 1 MO DOING BETTER  better since cortisone inj 8/14 but still with difficulty with overhead activity left shoulder. Some pain with sleeping. Still receiving PT for c-spine with improvement  HPI  Review of Systems  Constitutional: Negative for fatigue and fever.  HENT: Negative for ear pain.   Eyes: Negative for pain.  Respiratory: Negative for shortness of breath and stridor.   Cardiovascular: Negative for leg swelling.  Gastrointestinal: Negative for constipation and diarrhea.  Genitourinary: Negative for difficulty urinating.  Musculoskeletal: Negative for back pain and neck pain.  Skin: Negative for rash.  Allergic/Immunologic: Negative for food allergies.  Neurological: Negative for weakness and numbness.  Hematological: Does not bruise/bleed easily.  Psychiatric/Behavioral: Negative for sleep disturbance.     Objective: Vital Signs: BP 114/69 (BP Location: Left Arm, Patient Position: Sitting, Cuff Size: Normal)   Pulse 87   Ht 6' (1.829 m)   Wt 185 lb (83.9 kg)   BMI 25.09 kg/m   Physical Exam  Constitutional: She is oriented to person, place, and time. She appears well-developed and well-nourished.  HENT:  Mouth/Throat:  Oropharynx is clear and moist.  Eyes: Pupils are equal, round, and reactive to light. EOM are normal.  Pulmonary/Chest: Effort normal.  Neurological: She is alert and oriented to person, place, and time.  Skin: Skin is warm and dry.  Psychiatric: She has a normal mood and affect. Her behavior is normal.    Ortho Examleft shoulder with pos impingement. Pain with overhead motion. No loss of motion. Good strength.Neck with less pain with ROM since starting PT  Specialty Comments:  No specialty comments available.  Imaging: No results found.   PMFS History: Patient Active Problem List   Diagnosis Date Noted  . Chronic left shoulder pain 09/18/2017  . Cervicalgia 09/18/2017  . Obesity (BMI 30.0-34.9) 05/20/2017  . Peripheral edema 09/07/2015  . Vitamin D deficiency 06/22/2014  . Depression 06/21/2014  . GAD (generalized anxiety disorder) 06/21/2014  . Back pain 06/21/2014  . Osteopenia 02/11/2013  . Constipation 05/08/2010   Past Medical History:  Diagnosis Date  . Arthritis   . Chronic back pain   . Chronic constipation   . Depression   . MVP (mitral valve prolapse)    had ablation 1998  . Numbness in left leg    s/p spinal surgery  . Supraventricular tachycardia (HCC)    ablation 1998  . Vitamin D deficiency     Family History  Problem Relation Age of Onset  . Coronary artery disease Father   . Colon polyps Father   .  Prostate cancer Maternal Grandfather   . Hodgkin's lymphoma Son   . Liver cancer Paternal Uncle        ?  Marland Kitchen Arthritis Mother   . Colon cancer Neg Hx     Past Surgical History:  Procedure Laterality Date  . AV NODE ABLATION  1998   for svt  . BACK SURGERY  2001   lumbar fusion  . PARTIAL HYSTERECTOMY  1993  . REPAIR EXTENSOR TENDON Left 03/06/2013   Procedure: LEFT LONG BOUTONNIERE REPAIR;  Surgeon: Wyn Forster., MD;  Location: [facility];  Service: Orthopedics;  Laterality: Left;  . SPINE SURGERY    . stimulation system  implant  07/31/00   lumbar nerve stimulator-none funtioning now   Social History   Occupational History  . Occupation: disabled    Associate Professor: UNEMPLOYED  Tobacco Use  . Smoking status: Never Smoker  . Smokeless tobacco: Never Used  Substance and Sexual Activity  . Alcohol use: No  . Drug use: No  . Sexual activity: Never

## 2017-10-17 ENCOUNTER — Ambulatory Visit: Payer: Medicare HMO | Admitting: *Deleted

## 2017-10-17 DIAGNOSIS — M542 Cervicalgia: Secondary | ICD-10-CM

## 2017-10-17 DIAGNOSIS — M25512 Pain in left shoulder: Principal | ICD-10-CM

## 2017-10-17 DIAGNOSIS — G8929 Other chronic pain: Secondary | ICD-10-CM | POA: Diagnosis not present

## 2017-10-17 DIAGNOSIS — M25561 Pain in right knee: Secondary | ICD-10-CM | POA: Diagnosis not present

## 2017-10-17 DIAGNOSIS — M25612 Stiffness of left shoulder, not elsewhere classified: Secondary | ICD-10-CM | POA: Diagnosis not present

## 2017-10-17 NOTE — Therapy (Signed)
[facility] [address] [place], [place], [address] Phone: [phone]   Fax:  [phone]  Physical Therapy Treatment  Patient Details  Name: Molly Castaneda MRN: [id number] Date of Birth: [birth year] Referring Provider: [name], FNP   Encounter Date: 10/17/2017  PT End of Session - 10/17/17 0909    Visit Number  9    Number of Visits  12    Date for PT Re-Evaluation  10/30/17    Authorization Type  Progress note every 10th visit.    PT Start Time  0900    PT Stop Time  0951    PT Time Calculation (min)  51 min    Activity Tolerance  Patient tolerated treatment well    Behavior During Therapy  WFL for tasks assessed/performed       Past Medical History:  Diagnosis Date  . Arthritis   . Chronic back pain   . Chronic constipation   . Depression   . MVP (mitral valve prolapse)    had ablation 1998  . Numbness in left leg    s/p spinal surgery  . Supraventricular tachycardia (HCC)    ablation 1998  . Vitamin D deficiency     Past Surgical History:  Procedure Laterality Date  . AV NODE ABLATION  1998   for svt  . BACK SURGERY  2001   lumbar fusion  . PARTIAL HYSTERECTOMY  1993  . REPAIR EXTENSOR TENDON Left 03/06/2013   Procedure: LEFT LONG BOUTONNIERE REPAIR;  Surgeon: Wyn Forsterobert V Sypher Jr., MD;  Location: [facility];  Service: Orthopedics;  Laterality: Left;  . SPINE SURGERY    . stimulation system implant  07/31/00   lumbar nerve stimulator-none funtioning now    There were no vitals filed for this visit.  Subjective Assessment - 10/17/17 0903    Subjective  Went to MD yesterday. Having an MRI next WED.    Pertinent History  chronic pain, depression, internal stimulator but "dead"    Limitations  House hold activities;Lifting    Diagnostic tests  X-Ray normal    Patient Stated Goals  sleep better, less pain with dressing    Currently in Pain?  Yes    Pain Score  3     Pain Location  Shoulder     Pain Orientation  Left    Pain Descriptors / Indicators  Dull    Pain Type  Chronic pain    Pain Onset  More than a month ago    Pain Frequency  Constant                       [facility] Adult PT Treatment/Exercise - 10/17/17 0001      Exercises   Exercises  Shoulder      Shoulder Exercises: Pulleys   Other Pulley Exercises  Standing UE ranger AAROM circles each way 3 x10      Shoulder Exercises: ROM/Strengthening   UBE (Upper Arm Bike)  90 RPMs x 8 mins      Modalities   Modalities  Electrical Stimulation;Moist Heat;Ultrasound      Moist Heat Therapy   Number Minutes Moist Heat  15 Minutes    Moist Heat Location  Shoulder      Electrical Stimulation   Electrical Stimulation Location  LT Utrap  IFC  80-150  x 12 mins    Electrical Stimulation Goals  Pain      Ultrasound   Ultrasound Location  LT Utrap/ levator    Ultrasound Parameters  1.5 w/cm2 x 12 mins      Manual Therapy   Manual Therapy  Soft tissue mobilization    Soft tissue mobilization  TPR to LT Utrap and LT levator and STW as well                  PT Long Term Goals - 10/17/17 1047      PT LONG TERM GOAL #1   Title  Patient will be independent with HEP    Time  6    Period  Weeks    Status  On-going      PT LONG TERM GOAL #2   Title  Patient will decreae pain while sleeping to 5/10 in L shoulder to improve quality of sleep.    Time  6    Period  Weeks    Status  Achieved      PT LONG TERM GOAL #3   Title  Patient will improve left shoulder MMT in all planes to 4/5 or greater to improve ability to perform home activities.    Time  6    Period  Weeks    Status  On-going      PT LONG TERM GOAL #4   Title  Patient will demonstrate improve L shoulder flexion and abduction AROM to 170 or greater to improve ability to don/doff clothing overhead.     Time  6    Period  Weeks    Status  On-going      PT LONG TERM GOAL #5   Title  Patient will improve cervical rotation AROM  to 60+ degrees in order to scan environment.    Time  6    Period  Weeks    Status  On-going            Plan - 10/17/17 1034    Clinical Impression Statement  Pt  arrived to PT today reporting F/U with MD and she will be having an MRI next week. She was able to complete all therex for LT UE this morning with mainly fatigue again. Pt sore in LT Utrap and Levator after Rx due to deep STW.  Normal response to Estim/heat today. She reports using her LT UE to do her hair is getting a little easier , but it still fatigues befor finishing    Clinical Presentation  Stable    Clinical Decision Making  Moderate    Rehab Potential  Fair    Clinical Impairments Affecting Rehab Potential  chronic pain    PT Frequency  2x / week    PT Duration  6 weeks    PT Treatment/Interventions  Cryotherapy;ADLs/Self Care Home Management;Moist Heat;Therapeutic exercise;Therapeutic activities;Balance training;Neuromuscular re-education;Patient/family education;Manual techniques;Passive range of motion;Ultrasound;Electrical Stimulation    PT Next Visit Plan  UBE, upper trap stretch, levator scap stretch, STW/M to L UT moist heat for pain relief.     Recert to add [place] and Estim approved    Consulted and Agree with Plan of Care  Patient       Patient will benefit from skilled therapeutic intervention in order to improve the following deficits and impairments:  Pain, Impaired UE functional use, Decreased activity tolerance, Decreased range of motion, Decreased strength, Postural dysfunction  Visit Diagnosis: Chronic left shoulder pain  Stiffness of left shoulder, not elsewhere classified  Cervicalgia     Problem List Patient Active Problem List   Diagnosis Date Noted  . Chronic  left shoulder pain 09/18/2017  . Cervicalgia 09/18/2017  . Obesity (BMI 30.0-34.9) 05/20/2017  . Peripheral edema 09/07/2015  . Vitamin D deficiency 06/22/2014  . Depression 06/21/2014  . GAD (generalized anxiety disorder)  06/21/2014  . Back pain 06/21/2014  . Osteopenia 02/11/2013  . Constipation 05/08/2010    Molly Castaneda,Molly Castaneda, PTA 10/17/2017, 10:53 AM  [facility] [address] [place], [place], [address] Phone: [phone]   Fax:  [phone]  Name: AHRIA SLAPPEY MRN: [id number] Date of Birth: 1960-05-21

## 2017-10-22 ENCOUNTER — Other Ambulatory Visit: Payer: Self-pay | Admitting: Radiology

## 2017-10-22 ENCOUNTER — Ambulatory Visit: Payer: Medicare HMO | Admitting: *Deleted

## 2017-10-22 ENCOUNTER — Telehealth (INDEPENDENT_AMBULATORY_CARE_PROVIDER_SITE_OTHER): Payer: Self-pay | Admitting: Orthopaedic Surgery

## 2017-10-22 DIAGNOSIS — M25561 Pain in right knee: Secondary | ICD-10-CM | POA: Diagnosis not present

## 2017-10-22 DIAGNOSIS — G8929 Other chronic pain: Secondary | ICD-10-CM

## 2017-10-22 DIAGNOSIS — M25612 Stiffness of left shoulder, not elsewhere classified: Secondary | ICD-10-CM | POA: Diagnosis not present

## 2017-10-22 DIAGNOSIS — M542 Cervicalgia: Secondary | ICD-10-CM | POA: Diagnosis not present

## 2017-10-22 DIAGNOSIS — M25512 Pain in left shoulder: Secondary | ICD-10-CM | POA: Diagnosis not present

## 2017-10-22 NOTE — Telephone Encounter (Signed)
Order CT

## 2017-10-22 NOTE — Telephone Encounter (Signed)
CT ARTHROGRAM PLEASE

## 2017-10-22 NOTE — Therapy (Signed)
[facility] [address] [place], [place], [address] Phone: [phone]   Fax:  [phone]  Physical Therapy Treatment  Patient Details  Name: Molly HarperShelia A Castaneda MRN: [id number] Date of Birth: 03-20-1960 Referring Provider: [name], FNP   Encounter Date: 10/22/2017  PT End of Session - 10/22/17 0928    Visit Number  10    Number of Visits  12    Date for PT Re-Evaluation  10/30/17    Authorization Type  Progress note every 10th visit.    PT Start Time  0906    PT Stop Time  0956    PT Time Calculation (min)  50 min       Past Medical History:  Diagnosis Date  . Arthritis   . Chronic back pain   . Chronic constipation   . Depression   . MVP (mitral valve prolapse)    had ablation 1998  . Numbness in left leg    s/p spinal surgery  . Supraventricular tachycardia (HCC)    ablation 1998  . Vitamin D deficiency     Past Surgical History:  Procedure Laterality Date  . AV NODE ABLATION  1998   for svt  . BACK SURGERY  2001   lumbar fusion  . PARTIAL HYSTERECTOMY  1993  . REPAIR EXTENSOR TENDON Left 03/06/2013   Procedure: LEFT LONG BOUTONNIERE REPAIR;  Surgeon: Wyn Forsterobert V Sypher Jr., MD;  Location: [facility];  Service: Orthopedics;  Laterality: Left;  . SPINE SURGERY    . stimulation system implant  07/31/00   lumbar nerve stimulator-none funtioning now    There were no vitals filed for this visit.                    [facility] Adult PT Treatment/Exercise - 10/22/17 0001      Exercises   Exercises  Shoulder      Shoulder Exercises: Pulleys   Other Pulley Exercises  Standing UE ranger AAROM circles each way 3 x10      Shoulder Exercises: ROM/Strengthening   UBE (Upper Arm Bike)  90 RPMs x 8 mins      Modalities   Modalities  Electrical Stimulation;Moist Heat;Ultrasound      Moist Heat Therapy   Number Minutes Moist Heat  15 Minutes    Moist Heat Location  Shoulder      Electrical  Stimulation   Electrical Stimulation Location  LT Utrap  IFC  80-150  x 12 mins    Electrical Stimulation Goals  Pain      Ultrasound   Ultrasound Location  LT Utrap/levator    Ultrasound Parameters  1.5 w/cm2 x10 mins     Ultrasound Goals  Pain      Manual Therapy   Manual Therapy  --    Soft tissue mobilization  --                  PT Long Term Goals - 10/17/17 1047      PT LONG TERM GOAL #1   Title  Patient will be independent with HEP    Time  6    Period  Weeks    Status  On-going      PT LONG TERM GOAL #2   Title  Patient will decreae pain while sleeping to 5/10 in L shoulder to improve quality of sleep.    Time  6    Period  Weeks    Status  Achieved  PT LONG TERM GOAL #3   Title  Patient will improve left shoulder MMT in all planes to 4/5 or greater to improve ability to perform home activities.    Time  6    Period  Weeks    Status  On-going      PT LONG TERM GOAL #4   Title  Patient will demonstrate improve L shoulder flexion and abduction AROM to 170 or greater to improve ability to don/doff clothing overhead.     Time  6    Period  Weeks    Status  On-going      PT LONG TERM GOAL #5   Title  Patient will improve cervical rotation AROM to 60+ degrees in order to scan environment.    Time  6    Period  Weeks    Status  On-going            Plan - 10/22/17 1800    Clinical Impression Statement  Pt arrived today doing ok after last Rx and feels that current Rxs have helped keep pain levels lower, but unable to omit pain and tautness. Pt had decreased pain and tautness and was able to perform  LT UE therex again with mainly fatigue.     Clinical Presentation  Stable    Rehab Potential  Fair    Clinical Impairments Affecting Rehab Potential  chronic pain    PT Frequency  2x / week    PT Treatment/Interventions  Cryotherapy;ADLs/Self Care Home Management;Moist Heat;Therapeutic exercise;Therapeutic activities;Balance training;Neuromuscular  re-education;Patient/family education;Manual techniques;Passive range of motion;Ultrasound;Electrical Stimulation    PT Next Visit Plan  UBE, upper trap stretch, levator scap stretch, STW/M to L UT moist heat for pain relief.     Recert to add [place] and Estim approved    Consulted and Agree with Plan of Care  Patient       Patient will benefit from skilled therapeutic intervention in order to improve the following deficits and impairments:  Pain, Impaired UE functional use, Decreased activity tolerance, Decreased range of motion, Decreased strength, Postural dysfunction  Visit Diagnosis: Chronic left shoulder pain  Stiffness of left shoulder, not elsewhere classified  Cervicalgia     Problem List Patient Active Problem List   Diagnosis Date Noted  . Chronic left shoulder pain 09/18/2017  . Cervicalgia 09/18/2017  . Obesity (BMI 30.0-34.9) 05/20/2017  . Peripheral edema 09/07/2015  . Vitamin D deficiency 06/22/2014  . Depression 06/21/2014  . GAD (generalized anxiety disorder) 06/21/2014  . Back pain 06/21/2014  . Osteopenia 02/11/2013  . Constipation 05/08/2010    Jersey Espinoza,CHRIS, PTA 10/22/2017, 6:12 PM  [facility] [address] [place], [place], [address] Phone: [phone]   Fax:  [phone]  Name: Molly Castaneda MRN: [id number] Date of Birth: [birth year]

## 2017-10-22 NOTE — Telephone Encounter (Signed)
FYIRedge Gainer: Ronan calling to advise you patient has a stimulator, and due to this MR has to be canceled. Patient states stimulator does not work, but MR still has to be cancelled. ? Scheduling a CT.

## 2017-10-22 NOTE — Telephone Encounter (Signed)
SENT CT L SHOULDER ARTHROGRAM REFERRAL IN

## 2017-10-22 NOTE — Telephone Encounter (Signed)
Please order. Thank you. 

## 2017-10-23 ENCOUNTER — Ambulatory Visit (HOSPITAL_COMMUNITY): Payer: Medicare HMO

## 2017-10-24 ENCOUNTER — Encounter: Payer: Medicare HMO | Admitting: *Deleted

## 2017-10-29 ENCOUNTER — Ambulatory Visit: Payer: Medicare HMO | Admitting: *Deleted

## 2017-10-29 DIAGNOSIS — M25612 Stiffness of left shoulder, not elsewhere classified: Secondary | ICD-10-CM | POA: Diagnosis not present

## 2017-10-29 DIAGNOSIS — M542 Cervicalgia: Secondary | ICD-10-CM

## 2017-10-29 DIAGNOSIS — M25512 Pain in left shoulder: Principal | ICD-10-CM

## 2017-10-29 DIAGNOSIS — G8929 Other chronic pain: Secondary | ICD-10-CM

## 2017-10-29 DIAGNOSIS — M25561 Pain in right knee: Secondary | ICD-10-CM | POA: Diagnosis not present

## 2017-10-29 NOTE — Therapy (Signed)
[facility] [address] [place], [place], [address] Phone: [phone]   Fax:  [phone]  Physical Therapy Treatment  Patient Details  Name: Molly HarperShelia A Castaneda MRN: [id number] Date of Birth: 10-26-1960 Referring Provider: [name], FNP   Encounter Date: 10/29/2017  PT End of Session - 10/29/17 0916    Visit Number  11    Number of Visits  12    Date for PT Re-Evaluation  10/30/17    Authorization Type  Progress note every 10th visit.    PT Start Time  0900    PT Stop Time  0951    PT Time Calculation (min)  51 min       Past Medical History:  Diagnosis Date  . Arthritis   . Chronic back pain   . Chronic constipation   . Depression   . MVP (mitral valve prolapse)    had ablation 1998  . Numbness in left leg    s/p spinal surgery  . Supraventricular tachycardia (HCC)    ablation 1998  . Vitamin D deficiency     Past Surgical History:  Procedure Laterality Date  . AV NODE ABLATION  1998   for svt  . BACK SURGERY  2001   lumbar fusion  . PARTIAL HYSTERECTOMY  1993  . REPAIR EXTENSOR TENDON Left 03/06/2013   Procedure: LEFT LONG BOUTONNIERE REPAIR;  Surgeon: Wyn Forsterobert V Sypher Jr., MD;  Location: [facility];  Service: Orthopedics;  Laterality: Left;  . SPINE SURGERY    . stimulation system implant  07/31/00   lumbar nerve stimulator-none funtioning now    There were no vitals filed for this visit.  Subjective Assessment - 10/29/17 0912    Subjective  MRI changed to October now.    Pertinent History  chronic pain, depression, internal stimulator but "dead"    Limitations  House hold activities;Lifting    Diagnostic tests  X-Ray normal    Patient Stated Goals  sleep better, less pain with dressing    Currently in Pain?  Yes    Pain Score  3     Pain Location  Shoulder    Pain Orientation  Left    Pain Descriptors / Indicators  Dull    Pain Type  Chronic pain    Pain Onset  More than a month ago                        [facility] - 10/29/17 0001      Exercises   Exercises  Shoulder      Shoulder Exercises: Pulleys   Flexion  3 minutes   Flexion   Other Pulley Exercises  Standing UE ranger AAROM circles each way 3 x10      Shoulder Exercises: ROM/Strengthening   UBE (Upper Arm Bike)  90 RPMs x 8 mins      Modalities   Modalities  Electrical Stimulation;Moist Heat;Ultrasound      Moist Heat Therapy   Number Minutes Moist Heat  15 Minutes    Moist Heat Location  Shoulder      Electrical Stimulation   Electrical Stimulation Location  LT Utrap  IFC  80-150  x 12 mins    Electrical Stimulation Goals  Pain      Ultrasound   Ultrasound Location  LT Utrap/Levator    Ultrasound Parameters  combo 1.5 w/cm2 x 12 mins    Ultrasound Goals  Pain  PT Long Term Goals - 10/17/17 1047      PT LONG TERM GOAL #1   Title  Patient will be independent with HEP    Time  6    Period  Weeks    Status  On-going      PT LONG TERM GOAL #2   Title  Patient will decreae pain while sleeping to 5/10 in L shoulder to improve quality of sleep.    Time  6    Period  Weeks    Status  Achieved      PT LONG TERM GOAL #3   Title  Patient will improve left shoulder MMT in all planes to 4/5 or greater to improve ability to perform home activities.    Time  6    Period  Weeks    Status  On-going      PT LONG TERM GOAL #4   Title  Patient will demonstrate improve L shoulder flexion and abduction AROM to 170 or greater to improve ability to don/doff clothing overhead.     Time  6    Period  Weeks    Status  On-going      PT LONG TERM GOAL #5   Title  Patient will improve cervical rotation AROM to 60+ degrees in order to scan environment.    Time  6    Period  Weeks    Status  On-going            Plan - 10/29/17 1423    Clinical Impression Statement  Pt arrived today doing  fairly well, but reports that her MRI is  rescheduled until sometime in October. She did well with therex today and has standing  shldr flexion  to 150 degrees, IR HBB to T12, and ER to 60 degrees.    Clinical Presentation  Stable    Rehab Potential  Fair    Clinical Impairments Affecting Rehab Potential  chronic pain    PT Frequency  2x / week    PT Duration  6 weeks    PT Treatment/Interventions  Cryotherapy;ADLs/Self Care Home Management;Moist Heat;Therapeutic exercise;Therapeutic activities;Balance training;Neuromuscular re-education;Patient/family education;Manual techniques;Passive range of motion;Ultrasound;Electrical Stimulation    PT Next Visit Plan  .   ON hold after next visit until MRI results?    Consulted and Agree with Plan of Care  Patient       Patient will benefit from skilled therapeutic intervention in order to improve the following deficits and impairments:  Pain, Impaired UE functional use, Decreased activity tolerance, Decreased range of motion, Decreased strength, Postural dysfunction  Visit Diagnosis: Chronic left shoulder pain  Stiffness of left shoulder, not elsewhere classified  Cervicalgia     Problem List Patient Active Problem List   Diagnosis Date Noted  . Chronic left shoulder pain 09/18/2017  . Cervicalgia 09/18/2017  . Obesity (BMI 30.0-34.9) 05/20/2017  . Peripheral edema 09/07/2015  . Vitamin D deficiency 06/22/2014  . Depression 06/21/2014  . GAD (generalized anxiety disorder) 06/21/2014  . Back pain 06/21/2014  . Osteopenia 02/11/2013  . Constipation 05/08/2010    Ellyse Rotolo,CHRIS, PTA 10/29/2017, 3:47 PM  [facility] [address] [place], [place], [address] Phone: [phone]   Fax:  [phone]  Name: Molly Castaneda MRN: [id number] Date of Birth: 12/18/1960

## 2017-10-31 ENCOUNTER — Ambulatory Visit: Payer: Medicare HMO | Admitting: Physical Therapy

## 2017-10-31 DIAGNOSIS — M25612 Stiffness of left shoulder, not elsewhere classified: Secondary | ICD-10-CM | POA: Diagnosis not present

## 2017-10-31 DIAGNOSIS — M542 Cervicalgia: Secondary | ICD-10-CM

## 2017-10-31 DIAGNOSIS — G8929 Other chronic pain: Secondary | ICD-10-CM | POA: Diagnosis not present

## 2017-10-31 DIAGNOSIS — M25512 Pain in left shoulder: Principal | ICD-10-CM

## 2017-10-31 DIAGNOSIS — M25561 Pain in right knee: Secondary | ICD-10-CM | POA: Diagnosis not present

## 2017-10-31 NOTE — Therapy (Addendum)
[facility] [place], [place], [address] Phone: [phone]   Fax:  [phone]  Physical Therapy Treatment/Discharge  Patient Details  Name: Molly Castaneda MRN: [id number] Date of Birth: 19-Sep-1960 Referring Provider (PT): Evelina Dun, FNP   Encounter Date: 10/31/2017  PT End of Session - 10/31/17 1220    Visit Number  12    Number of Visits  12    Date for PT Re-Evaluation  10/30/17    Authorization Type  Progress note every 10th visit.    PT Start Time  0900    PT Stop Time  0955    PT Time Calculation (min)  55 min    Activity Tolerance  Patient tolerated treatment well    Behavior During Therapy  WFL for tasks assessed/performed       Past Medical History:  Diagnosis Date  . Arthritis   . Chronic back pain   . Chronic constipation   . Depression   . MVP (mitral valve prolapse)    had ablation 1998  . Numbness in left leg    s/p spinal surgery  . Supraventricular tachycardia ([place])    ablation 1998  . Vitamin D deficiency     Past Surgical History:  Procedure Laterality Date  . [place]   for svt  . BACK SURGERY  2001   lumbar fusion  . PARTIAL HYSTERECTOMY  1993  . REPAIR EXTENSOR TENDON Left 03/06/2013   Procedure: LEFT LONG BOUTONNIERE REPAIR;  Surgeon: Cammie Sickle., MD;  Location: [facility];  Service: Orthopedics;  Laterality: Left;  . SPINE SURGERY    . stimulation system implant  07/31/00   lumbar nerve stimulator-none funtioning now    There were no vitals filed for this visit.  Subjective Assessment - 10/31/17 1232    Subjective  Patient reports shoulder is an achy 2/10 today. MRI is scheduled for October 4th.    Pertinent History  chronic pain, depression, internal stimulator but "dead"    Limitations  House hold activities;Lifting    Diagnostic tests  X-Ray normal    Patient Stated Goals  sleep better, less pain with dressing    Currently in Pain?   Yes    Pain Score  2     Pain Location  Shoulder    Pain Orientation  Left    Pain Descriptors / Indicators  Aching    Pain Type  Chronic pain    Pain Onset  More than a month ago    Pain Frequency  Constant         [facility] PT Assessment - 10/31/17 0001      AROM   Left Shoulder Flexion  155 Degrees    Left Shoulder ABduction  95 Degrees    Cervical Flexion  25    Cervical Extension  40    Cervical - Right Side Bend  36    Cervical - Left Side Bend  35    Cervical - Right Rotation  54    Cervical - Left Rotation  40      Strength   Left Shoulder Flexion  3+/5    Left Shoulder ABduction  4-/5    Left Shoulder Internal Rotation  3+/5    Left Shoulder External Rotation  4/5                   [facility] Adult PT Treatment/Exercise - 10/31/17 0001  Exercises   Exercises  Shoulder      Shoulder Exercises: Pulleys   Flexion  3 minutes    Other Pulley Exercises  Standing UE ranger AAROM circles each way 3 x10      Shoulder Exercises: ROM/Strengthening   UBE (Upper Arm Bike)  120RPMs x 8 mins      Modalities   Modalities  Electrical Stimulation;Moist Heat;Ultrasound      Moist Heat Therapy   Number Minutes Moist Heat  15 Minutes    Moist Heat Location  Shoulder      Electrical Stimulation   Electrical Stimulation Location  LT Utrap  IFC  80-150  x 15 mins    Electrical Stimulation Goals  Pain      Ultrasound   Ultrasound Location  LT UT/Levator    Ultrasound Parameters  combo 1.5 w/cm2, 100%, 1 mhz x12 mins    Ultrasound Goals  Pain                  PT Long Term Goals - 10/31/17 1226      PT LONG TERM GOAL #1   Title  Patient will be independent with HEP    Time  6    Period  Weeks    Status  Achieved      PT LONG TERM GOAL #2   Title  Patient will decrease pain while sleeping to 5/10 in L shoulder to improve quality of sleep.    Time  6    Period  Weeks    Status  Achieved      PT LONG TERM GOAL #3   Title  Patient will improve  left shoulder MMT in all planes to 4/5 or greater to improve ability to perform home activities.    Time  6    Period  Weeks    Status  Partially Met      PT LONG TERM GOAL #4   Title  Patient will demonstrate improve L shoulder flexion and abduction AROM to 170 or greater to improve ability to don/doff clothing overhead.     Time  6    Period  Weeks    Status  Not Met      PT LONG TERM GOAL #5   Title  Patient will improve cervical rotation AROM to 60+ degrees in order to scan environment.    Time  6    Period  Weeks    Status  Not Met            Plan - 10/31/17 1221    Clinical Impression Statement  Patient was able to tolerate treatment well. Patient's goals were partially met and reported her main goal of sleeping better throughout the night was achieved. Patient and PT discussed continuing HEP at home until she sees her MD after her MRI.  Patient reported understanding. Patient to be placed on hold until MD visit.    Clinical Presentation  Stable    Clinical Decision Making  Moderate    Rehab Potential  Fair    Clinical Impairments Affecting Rehab Potential  chronic pain    PT Frequency  2x / week    PT Duration  6 weeks    PT Treatment/Interventions  Cryotherapy;ADLs/Self Care Home Management;Moist Heat;Therapeutic exercise;Therapeutic activities;Balance training;Neuromuscular re-education;Patient/family education;Manual techniques;Passive range of motion;Ultrasound;Electrical Stimulation    PT Next Visit Plan  On hold until MD visit.    Consulted and Agree with Plan of Care  Patient  Patient will benefit from skilled therapeutic intervention in order to improve the following deficits and impairments:  Pain, Impaired UE functional use, Decreased activity tolerance, Decreased range of motion, Decreased strength, Postural dysfunction  Visit Diagnosis: Chronic left shoulder pain  Stiffness of left shoulder, not elsewhere classified  Cervicalgia     Problem  List Patient Active Problem List   Diagnosis Date Noted  . Chronic left shoulder pain 09/18/2017  . Cervicalgia 09/18/2017  . Obesity (BMI 30.0-34.9) 05/20/2017  . Peripheral edema 09/07/2015  . Vitamin D deficiency 06/22/2014  . Depression 06/21/2014  . GAD (generalized anxiety disorder) 06/21/2014  . Back pain 06/21/2014  . Osteopenia 02/11/2013  . Constipation 05/08/2010   Gabriela Eves, PT, DPT 10/31/2017, 12:33 PM  [facility] [address] [place], [place], [address] Phone: [phone]   Fax:  [phone]  Name: Molly Castaneda MRN: [id number] Date of Birth: [birth year]  PHYSICAL THERAPY DISCHARGE SUMMARY  Visits from Start of Care: 12  Current functional level related to goals / functional outcomes: See above   Remaining deficits: See goals   Education / Equipment: HEP Plan: Patient agrees to discharge.  Patient goals were not met. Patient is being discharged due to not returning since the last visit.  ?????    Placed on hold until MD

## 2017-11-08 ENCOUNTER — Ambulatory Visit
Admission: RE | Admit: 2017-11-08 | Discharge: 2017-11-08 | Disposition: A | Discharge status: Home / Self Care | Payer: Medicare HMO | Source: Ambulatory Visit | Attending: Orthopaedic Surgery | Admitting: Orthopaedic Surgery

## 2017-11-08 DIAGNOSIS — S46012A Strain of muscle(s) and tendon(s) of the rotator cuff of left shoulder, initial encounter: Secondary | ICD-10-CM | POA: Diagnosis not present

## 2017-11-08 DIAGNOSIS — M25512 Pain in left shoulder: Principal | ICD-10-CM

## 2017-11-08 DIAGNOSIS — G8929 Other chronic pain: Secondary | ICD-10-CM

## 2017-11-08 MED ORDER — IOPAMIDOL (ISOVUE-M 200) INJECTION 41%
15.0000 mL | Freq: Once | INTRAMUSCULAR | Status: AC
  Administered 2017-11-08: 15 mL via INTRA_ARTICULAR

## 2017-11-10 ENCOUNTER — Other Ambulatory Visit: Payer: Self-pay | Admitting: Family

## 2017-11-10 DIAGNOSIS — R609 Edema, unspecified: Secondary | ICD-10-CM

## 2017-11-12 ENCOUNTER — Ambulatory Visit (INDEPENDENT_AMBULATORY_CARE_PROVIDER_SITE_OTHER): Payer: Medicare HMO | Admitting: Orthopaedic Surgery

## 2017-11-14 ENCOUNTER — Other Ambulatory Visit: Payer: Medicare HMO

## 2017-11-14 ENCOUNTER — Encounter (INDEPENDENT_AMBULATORY_CARE_PROVIDER_SITE_OTHER): Payer: Self-pay | Admitting: Orthopaedic Surgery

## 2017-11-20 DIAGNOSIS — F411 Generalized anxiety disorder: Secondary | ICD-10-CM | POA: Diagnosis not present

## 2017-11-20 DIAGNOSIS — M545 Low back pain: Secondary | ICD-10-CM | POA: Diagnosis not present

## 2017-11-20 DIAGNOSIS — M79605 Pain in left leg: Secondary | ICD-10-CM | POA: Diagnosis not present

## 2017-11-20 DIAGNOSIS — G8921 Chronic pain due to trauma: Secondary | ICD-10-CM | POA: Diagnosis not present

## 2017-11-20 DIAGNOSIS — R69 Illness, unspecified: Secondary | ICD-10-CM | POA: Diagnosis not present

## 2017-11-25 ENCOUNTER — Other Ambulatory Visit: Payer: Self-pay | Admitting: Family

## 2017-11-25 DIAGNOSIS — F32A Depression, unspecified: Secondary | ICD-10-CM

## 2017-11-25 DIAGNOSIS — F329 Major depressive disorder, single episode, unspecified: Secondary | ICD-10-CM

## 2017-11-25 DIAGNOSIS — F411 Generalized anxiety disorder: Secondary | ICD-10-CM

## 2017-11-26 ENCOUNTER — Ambulatory Visit: Payer: Medicare HMO | Admitting: Family

## 2017-11-26 NOTE — Telephone Encounter (Signed)
Last seen 09/04/17  Molly Castaneda 

## 2017-12-03 DIAGNOSIS — R69 Illness, unspecified: Secondary | ICD-10-CM | POA: Diagnosis not present

## 2017-12-04 ENCOUNTER — Encounter (INDEPENDENT_AMBULATORY_CARE_PROVIDER_SITE_OTHER): Payer: Self-pay | Admitting: Orthopaedic Surgery

## 2017-12-04 ENCOUNTER — Ambulatory Visit (INDEPENDENT_AMBULATORY_CARE_PROVIDER_SITE_OTHER): Payer: Medicare HMO | Admitting: Orthopaedic Surgery

## 2017-12-04 VITALS — BP 123/79 | HR 81 | Ht 72.0 in | Wt 185.0 lb

## 2017-12-04 DIAGNOSIS — M25512 Pain in left shoulder: Secondary | ICD-10-CM

## 2017-12-04 DIAGNOSIS — G8929 Other chronic pain: Secondary | ICD-10-CM

## 2017-12-04 DIAGNOSIS — R69 Illness, unspecified: Secondary | ICD-10-CM | POA: Diagnosis not present

## 2017-12-04 NOTE — Progress Notes (Signed)
Office Visit Note   Patient: Molly Castaneda           Date of Birth: January 13, 1961           MRN: [id number] Visit Date: 12/04/2017              Requested by: [name], FNP [address] [place], [place] [address] PCP: [name], FNP  .  AC joint reveals moderate to mild degenerative changes with a type II acromion.  No significant glenohumeral arthropathy.  Assessment & Plan: Visit Diagnoses:  1. Chronic left shoulder pain    Tear of the supraspinatus measuring 7 mm anterior to posterior.  There is also a high-grade partial articular surface tear of the infraspinatus.  The subscapularis and teres minor are intact.  No muscular atrophy.  Biceps long head intact Plan: CT scan left shoulder with contrast demonstrates a full-thickness insertion.  Long discussion regarding CT scan and treatment options.  Just an arthroscopic SCD, DCR and mini open rotator cuff tear repair based on the chronicity of her problem since her accident in August 2018.  Mrs. Red is always concerned about surgery as she has had difficulty postop spine surgery and hand surgery.  We discussed the procedure with a mini open repair time in a sling and the possibility of incomplete pain relief and possibly even recurrent cuff tear and she will require a course of physical therapy.  She is presently on disability.  She would like to proceed.  Office visit over 30 minutes regarding all of the above and answering questions.  50% of the time in counseling  Follow-Up Instructions: Return will schedule surgery.   Orders:  No orders of the defined types were placed in this encounter.  No orders of the defined types were placed in this encounter.     Procedures: No procedures performed   Clinical Data: No additional findings.   Subjective: Chief Complaint  Patient presents with  . Follow-up    CT ARTHROGRAM LEFT SHOULDER FOLOW UP    HPI  Review of Systems  Constitutional: Negative for fatigue and  fever.  HENT: Negative for ear pain.   Eyes: Negative for pain.  Respiratory: Negative for cough and shortness of breath.   Cardiovascular: Negative for leg swelling.  Gastrointestinal: Negative for constipation and diarrhea.  Genitourinary: Negative for difficulty urinating.  Musculoskeletal: Negative for back pain and neck pain.  Skin: Negative for rash.  Allergic/Immunologic: Negative for food allergies.  Neurological: Positive for weakness and numbness.  Hematological: Does not bruise/bleed easily.  Psychiatric/Behavioral: Positive for sleep disturbance.     Objective: Vital Signs: BP 123/79 (BP Location: Right Arm, Patient Position: Sitting, Cuff Size: Normal)   Pulse 81   Ht 6' (1.829 m)   Wt 185 lb (83.9 kg)   BMI 25.09 kg/m   Physical Exam  Constitutional: She is oriented to person, place, and time. She appears well-developed and well-nourished.  HENT:  Mouth/Throat: Oropharynx is clear and moist.  Eyes: Pupils are equal, round, and reactive to light. EOM are normal.  Pulmonary/Chest: Effort normal.  Neurological: She is alert and oriented to person, place, and time.  Skin: Skin is warm and dry.  Psychiatric: She has a normal mood and affect. Her behavior is normal.    Ortho Exam awake alert and oriented  Specialty Comments:  No specialty comments available.  Imaging: X3.  Comfortable sitting.  Able to place left arm overhead with a circuitous arc.  Also  able to touch the lower part of her back.  No evidence of adhesive capsulitis.  Positive impingement and empty can testing.  Some prominence of the acromioclavicular joint with mild tenderness.  Also some tenderness in the anterior subacromial region.  No grinding or crepitation.  Good grip and good release. No results found.   PMFS History: Patient Active Problem List   Diagnosis Date Noted  . Chronic left shoulder pain 09/18/2017  . Cervicalgia 09/18/2017  . Obesity (BMI 30.0-34.9) 05/20/2017  . Peripheral  edema 09/07/2015  . Vitamin D deficiency 06/22/2014  . Depression 06/21/2014  . GAD (generalized anxiety disorder) 06/21/2014  . Back pain 06/21/2014  . Osteopenia 02/11/2013  . Constipation 05/08/2010   Past Medical History:  Diagnosis Date  . Arthritis   . Chronic back pain   . Chronic constipation   . Depression   . MVP (mitral valve prolapse)    had ablation 1998  . Numbness in left leg    s/p spinal surgery  . Supraventricular tachycardia (HCC)    ablation 1998  . Vitamin D deficiency     Family History  Problem Relation Age of Onset  . Coronary artery disease Father   . Colon polyps Father   . Prostate cancer Maternal Grandfather   . Hodgkin's lymphoma Son   . Liver cancer Paternal Uncle        ?  Marland Kitchen Arthritis Mother   . Colon cancer Neg Hx     Past Surgical History:  Procedure Laterality Date  . AV NODE ABLATION  1998   for svt  . BACK SURGERY  2001   lumbar fusion  . PARTIAL HYSTERECTOMY  1993  . REPAIR EXTENSOR TENDON Left 03/06/2013   Procedure: LEFT LONG BOUTONNIERE REPAIR;  Surgeon: Wyn Forster., MD;  Location: [facility];  Service: Orthopedics;  Laterality: Left;  . SPINE SURGERY    . stimulation system implant  07/31/00   lumbar nerve stimulator-none funtioning now   Social History   Occupational History  . Occupation: disabled    Associate Professor: UNEMPLOYED  Tobacco Use  . Smoking status: Never Smoker  . Smokeless tobacco: Never Used  Substance and Sexual Activity  . Alcohol use: No  . Drug use: No  . Sexual activity: Never

## 2017-12-09 ENCOUNTER — Other Ambulatory Visit: Payer: Self-pay | Admitting: Family Medicine

## 2017-12-09 DIAGNOSIS — Z1231 Encounter for screening mammogram for malignant neoplasm of breast: Secondary | ICD-10-CM

## 2017-12-10 ENCOUNTER — Ambulatory Visit (INDEPENDENT_AMBULATORY_CARE_PROVIDER_SITE_OTHER): Payer: Medicare HMO | Admitting: Family

## 2017-12-10 ENCOUNTER — Encounter: Payer: Self-pay | Admitting: Family

## 2017-12-10 VITALS — BP 119/75 | HR 86 | Temp 97.9°F | Ht 72.0 in | Wt 217.0 lb

## 2017-12-10 DIAGNOSIS — G8929 Other chronic pain: Secondary | ICD-10-CM | POA: Diagnosis not present

## 2017-12-10 DIAGNOSIS — M5442 Lumbago with sciatica, left side: Secondary | ICD-10-CM

## 2017-12-10 DIAGNOSIS — E559 Vitamin D deficiency, unspecified: Secondary | ICD-10-CM | POA: Diagnosis not present

## 2017-12-10 DIAGNOSIS — M8589 Other specified disorders of bone density and structure, multiple sites: Secondary | ICD-10-CM | POA: Diagnosis not present

## 2017-12-10 DIAGNOSIS — F331 Major depressive disorder, recurrent, moderate: Secondary | ICD-10-CM

## 2017-12-10 DIAGNOSIS — E669 Obesity, unspecified: Secondary | ICD-10-CM | POA: Diagnosis not present

## 2017-12-10 DIAGNOSIS — R69 Illness, unspecified: Secondary | ICD-10-CM | POA: Diagnosis not present

## 2017-12-10 DIAGNOSIS — Z713 Dietary counseling and surveillance: Secondary | ICD-10-CM | POA: Diagnosis not present

## 2017-12-10 DIAGNOSIS — K5903 Drug induced constipation: Secondary | ICD-10-CM

## 2017-12-10 DIAGNOSIS — F411 Generalized anxiety disorder: Secondary | ICD-10-CM

## 2017-12-10 DIAGNOSIS — E785 Hyperlipidemia, unspecified: Secondary | ICD-10-CM | POA: Diagnosis not present

## 2017-12-10 MED ORDER — TIZANIDINE HCL 4 MG PO TABS: ORAL_TABLET | ORAL | 5 refills | Status: AC

## 2017-12-10 NOTE — Progress Notes (Signed)
Subjective:    Patient ID: Molly Castaneda, female    DOB: 04-03-1960, 57 y.o.   MRN: [id number]  Chief Complaint  Patient presents with  . Medical Management of Chronic Issues    six month recheck, wants refill on tizanidine   PT presents to the office today for chronic follow up. Pt is followed by Pain management every 3 months. She is currently taking ms contin and zanaflex. She states she has a left rotator cuff tear and is scheduled for surgery.   Pt states she continues to gain weight and can not lose any thing. She is requesting weight loss medication today.  Back Pain  This is a chronic problem. The current episode started more than 1 year ago. The problem occurs intermittently. The problem has been waxing and waning since onset. The pain is present in the lumbar spine. The quality of the pain is described as aching. The pain is at a severity of 8/10. The pain is moderate. The symptoms are aggravated by bending, twisting and standing. Associated symptoms include leg pain. Pertinent negatives include no bladder incontinence, bowel incontinence or fever. Risk factors include sedentary lifestyle and history of osteoporosis. She has tried analgesics, bed rest and NSAIDs for the symptoms. The treatment provided moderate relief.  Anxiety  Presents for follow-up visit. Symptoms include decreased concentration, excessive worry, insomnia, irritability, nervous/anxious behavior, panic and restlessness. The severity of symptoms is moderate. The quality of sleep is good.    Depression         This is a chronic problem.  The current episode started more than 1 year ago.   The onset quality is gradual.   The problem occurs intermittently.  The problem has been waxing and waning since onset.  Associated symptoms include decreased concentration, insomnia, irritable, restlessness and sad.  Associated symptoms include no helplessness and no hopelessness.  Previous treatment provided mild relief.  Past  medical history includes anxiety.   Constipation  This is a chronic problem. The current episode started more than 1 year ago. The problem has been waxing and waning since onset. Her stool frequency is 1 time per week or less. Associated symptoms include back pain and bloating. Pertinent negatives include no fecal incontinence or fever. She has tried mineral oil and laxatives for the symptoms. The treatment provided moderate relief.  Hyperlipidemia  This is a chronic problem. The current episode started more than 1 year ago. The problem is uncontrolled. Recent lipid tests were reviewed and are normal. Exacerbating diseases include obesity. Associated symptoms include leg pain. Current antihyperlipidemic treatment includes diet change. The current treatment provides mild improvement of lipids.  Osteopenia  Last Dexascan 11/16/15.     Review of Systems  Constitutional: Positive for irritability. Negative for fever.  Gastrointestinal: Positive for bloating and constipation. Negative for bowel incontinence.  Genitourinary: Negative for bladder incontinence.  Musculoskeletal: Positive for back pain.  Psychiatric/Behavioral: Positive for decreased concentration and depression. The patient is nervous/anxious and has insomnia.   All other systems reviewed and are negative.      Objective:   Physical Exam  Constitutional: She is oriented to person, place, and time. She appears well-developed and well-nourished. She is irritable. No distress.  HENT:  Head: Normocephalic and atraumatic.  Right Ear: External ear normal.  Left Ear: External ear normal.  Mouth/Throat: Oropharynx is clear and moist.  Eyes: Pupils are equal, round, and reactive to light.  Neck: Normal range of motion. Neck supple. No thyromegaly present.  Cardiovascular: Normal rate, regular rhythm, normal heart sounds and intact distal pulses.  No murmur heard. Pulmonary/Chest: Effort normal and breath sounds normal. No respiratory  distress. She has no wheezes.  Abdominal: Soft. Bowel sounds are normal. She exhibits no distension. There is no tenderness.  Musculoskeletal: She exhibits no edema or tenderness.  Pain in left shoulder with abduction, pain in lumbar area with flexion   Neurological: She is alert and oriented to person, place, and time. She has normal reflexes. No cranial nerve deficit.  Skin: Skin is warm and dry.  Psychiatric: She has a normal mood and affect. Judgment and thought content normal. She is withdrawn.  Vitals reviewed.     BP 119/75   Pulse 86   Temp 97.9 F (36.6 C) (Oral)   Ht 6' (1.829 m)   Wt 217 lb (98.4 kg)   BMI 29.43 kg/m      Assessment & Plan:  Molly Castaneda comes in today with chief complaint of Medical Management of Chronic Issues (six month recheck, wants refill on tizanidine)   Diagnosis and orders addressed:  1. Moderate episode of recurrent major depressive disorder (HCC) - CMP14+EGFR - CBC with Differential/Platelet - TSH  2. Obesity (BMI 30.0-34.9) - CMP14+EGFR - CBC with Differential/Platelet - Amb Referral to Bariatric Surgery  3. Vitamin D deficiency - CMP14+EGFR - CBC with Differential/Platelet - VITAMIN D 25 Hydroxy (Vit-D Deficiency, Fractures)  4. Chronic left-sided low back pain with left-sided sciatica - CMP14+EGFR - CBC with Differential/Platelet  5. Osteopenia of multiple sites Pt states her husband has an appt and will do Dexa scan on next appt - CMP14+EGFR - CBC with Differential/Platelet  6. Drug-induced constipation - CMP14+EGFR - CBC with Differential/Platelet  7. GAD (generalized anxiety disorder) - CMP14+EGFR - CBC with Differential/Platelet - TSH  8. Weight loss counseling, encounter for - CMP14+EGFR - CBC with Differential/Platelet - Amb Referral to Bariatric Surgery  9. Hyperlipidemia, unspecified hyperlipidemia type - CMP14+EGFR - CBC with Differential/Platelet - Lipid panel   Labs pending Health  Maintenance reviewed Diet and exercise encouraged  Follow up plan: 6 months    Evelina Dun, FNP

## 2017-12-10 NOTE — Patient Instructions (Signed)

## 2017-12-11 LAB — CMP14+EGFR
ALBUMIN: 4.3 g/dL (ref 3.5–5.5)
ALT: 23 IU/L (ref 0–32)
AST: 15 IU/L (ref 0–40)
Albumin/Globulin Ratio: 1.5 (ref 1.2–2.2)
Alkaline Phosphatase: 75 IU/L (ref 39–117)
BUN / CREAT RATIO: 18 (ref 9–23)
BUN: 17 mg/dL (ref 6–24)
Bilirubin Total: 0.6 mg/dL (ref 0.0–1.2)
CO2: 25 mmol/L (ref 20–29)
CREATININE: 0.92 mg/dL (ref 0.57–1.00)
Calcium: 9.1 mg/dL (ref 8.7–10.2)
Chloride: 100 mmol/L (ref 96–106)
GFR calc Af Amer: 79 mL/min/{1.73_m2} (ref >59)
GFR calc non Af Amer: 68 mL/min/{1.73_m2} (ref >59)
GLOBULIN, TOTAL: 2.9 g/dL (ref 1.5–4.5)
Glucose: 86 mg/dL (ref 65–99)
Potassium: 4.1 mmol/L (ref 3.5–5.2)
SODIUM: 140 mmol/L (ref 134–144)
TOTAL PROTEIN: 7.2 g/dL (ref 6.0–8.5)

## 2017-12-11 LAB — LIPID PANEL
Chol/HDL Ratio: 2.7 ratio (ref 0.0–4.4)
Cholesterol, Total: 219 mg/dL — ABNORMAL HIGH (ref 100–199)
HDL: 81 mg/dL (ref >39)
LDL Calculated: 123 mg/dL — ABNORMAL HIGH (ref 0–99)
TRIGLYCERIDES: 77 mg/dL (ref 0–149)
VLDL CHOLESTEROL CAL: 15 mg/dL (ref 5–40)

## 2017-12-11 LAB — CBC WITH DIFFERENTIAL/PLATELET
BASOS: 1 %
Basophils Absolute: 0 10*3/uL (ref 0.0–0.2)
EOS (ABSOLUTE): 0.1 10*3/uL (ref 0.0–0.4)
EOS: 2 %
HEMATOCRIT: 43.8 % (ref 34.0–46.6)
Hemoglobin: 13.8 g/dL (ref 11.1–15.9)
IMMATURE GRANULOCYTES: 0 %
Immature Grans (Abs): 0 10*3/uL (ref 0.0–0.1)
Lymphocytes Absolute: 1.8 10*3/uL (ref 0.7–3.1)
Lymphs: 27 %
MCH: 26.7 pg (ref 26.6–33.0)
MCHC: 31.5 g/dL (ref 31.5–35.7)
MCV: 85 fL (ref 79–97)
MONOCYTES: 7 %
MONOS ABS: 0.5 10*3/uL (ref 0.1–0.9)
Neutrophils Absolute: 4.4 10*3/uL (ref 1.4–7.0)
Neutrophils: 63 %
PLATELETS: 302 10*3/uL (ref 150–450)
RBC: 5.17 x10E6/uL (ref 3.77–5.28)
RDW: 13.4 % (ref 12.3–15.4)
WBC: 6.9 10*3/uL (ref 3.4–10.8)

## 2017-12-11 LAB — VITAMIN D 25 HYDROXY (VIT D DEFICIENCY, FRACTURES): Vit D, 25-Hydroxy: 31.8 ng/mL (ref 30.0–100.0)

## 2017-12-11 LAB — TSH: TSH: 1.81 u[IU]/mL (ref 0.450–4.500)

## 2017-12-23 ENCOUNTER — Ambulatory Visit: Payer: Medicare HMO | Admitting: Family Medicine

## 2017-12-23 DIAGNOSIS — Z6832 Body mass index (BMI) 32.0-32.9, adult: Secondary | ICD-10-CM | POA: Diagnosis not present

## 2017-12-23 DIAGNOSIS — Z01419 Encounter for gynecological examination (general) (routine) without abnormal findings: Secondary | ICD-10-CM | POA: Diagnosis not present

## 2017-12-24 ENCOUNTER — Encounter: Payer: Self-pay | Admitting: Family Medicine

## 2017-12-24 ENCOUNTER — Ambulatory Visit (INDEPENDENT_AMBULATORY_CARE_PROVIDER_SITE_OTHER): Payer: Medicare HMO

## 2017-12-24 ENCOUNTER — Ambulatory Visit (INDEPENDENT_AMBULATORY_CARE_PROVIDER_SITE_OTHER): Payer: Medicare HMO | Admitting: Family Medicine

## 2017-12-24 VITALS — BP 130/79 | HR 87 | Temp 98.7°F | Ht 72.0 in | Wt 222.0 lb

## 2017-12-24 DIAGNOSIS — J069 Acute upper respiratory infection, unspecified: Secondary | ICD-10-CM

## 2017-12-24 DIAGNOSIS — R059 Cough, unspecified: Secondary | ICD-10-CM

## 2017-12-24 DIAGNOSIS — R05 Cough: Secondary | ICD-10-CM

## 2017-12-24 DIAGNOSIS — B9789 Other viral agents as the cause of diseases classified elsewhere: Secondary | ICD-10-CM | POA: Diagnosis not present

## 2017-12-24 MED ORDER — BENZONATATE 100 MG PO CAPS: 100.0000 mg | ORAL_CAPSULE | Freq: Three times a day (TID) | ORAL | 0 refills | Status: DC | PRN

## 2017-12-24 NOTE — Progress Notes (Signed)
Subjective: CC: Cough PCP: [name], FNP ONG:EXBMWU A Molly Castaneda is a 57 y.o. female presenting to clinic today for:  1. Cough Patient reports a 4-day history of cough.  Initially started as a scratchy throat and then proceeded to become a mildly productive cough with yellow phlegm.  She denies any associated pleuritic chest pain, hemoptysis, shortness of breath, fevers, chills.  She has had some sinus drainage and does note that the cough seems to be worse at nighttime.  She has had sick grandchildren with similar symptoms.   ROS: Per HPI  Allergies  Allergen Reactions  . Oxycodone-Acetaminophen Other (See Comments)    Chest Pains Patient can tolerate acetaminophen  . Oxycontin [Oxycodone Hcl] Other (See Comments)    Causes chest pain  . Hydrocodone Other (See Comments)   Past Medical History:  Diagnosis Date  . Arthritis   . Chronic back pain   . Chronic constipation   . Depression   . MVP (mitral valve prolapse)    had ablation 1998  . Numbness in left leg    s/p spinal surgery  . Supraventricular tachycardia (HCC)    ablation 1998  . Vitamin D deficiency     Current Outpatient Medications:  .  AMITIZA 24 MCG capsule, TAKE 1 CAPSULE (24 MCG TOTAL) BY MOUTH 2 (TWO) TIMES DAILY WITH A MEAL., Disp: , Rfl: 5 .  amphetamine-dextroamphetamine (ADDERALL) 10 MG tablet, Take 10 mg by mouth 2 (two) times daily with a meal. , Disp: , Rfl:  .  aspirin 81 MG tablet, Take 81 mg by mouth daily., Disp: , Rfl:  .  buPROPion (WELLBUTRIN SR) 150 MG 12 hr tablet, Take 150 mg by mouth 2 (two) times daily. , Disp: , Rfl:  .  Calcium Carbonate 500 MG CHEW, Chew 1 tablet (500 mg total) by mouth daily., Disp: 30 each, Rfl:  .  citalopram (CELEXA) 20 MG tablet, Please specify directions, refills and quantity, Disp: 90 tablet, Rfl: 0 .  diclofenac (VOLTAREN) 75 MG EC tablet, TAKE 1 TABLET BY MOUTH TWICE A DAY, Disp: 60 tablet, Rfl: 2 .  estradiol (ESTRACE) 1 MG tablet, Take 1 mg by mouth  daily., Disp: , Rfl:  .  furosemide (LASIX) 40 MG tablet, TAKE 1 TABLET BY MOUTH EVERY DAY, Disp: 90 tablet, Rfl: 0 .  morphine (MS CONTIN) 15 MG 12 hr tablet, Take 15 mg by mouth 3 (three) times daily., Disp: , Rfl: 0 .  Omega 3-6-9 Fatty Acids (OMEGA 3-6-9 COMPLEX) CAPS, Take 1 capsule by mouth daily.  , Disp: , Rfl:  .  prazosin (MINIPRESS) 1 MG capsule, TAKE 1 CAPSULE BY MOUTH NIGHTLY, Disp: , Rfl: 11 .  tiZANidine (ZANAFLEX) 4 MG tablet, Take 1 Tablet by mouth at bedtime as needed FOR MUSCLE SPASMS, Disp: 60 tablet, Rfl: 5 .  vitamin B-12 (CYANOCOBALAMIN) 1000 MCG tablet, Take 1,000 mcg by mouth daily.  , Disp: , Rfl:  .  Vitamin D, Cholecalciferol, 1000 units TABS, Take 1,000 Units by mouth daily., Disp: 60 tablet, Rfl:  .  clonazePAM (KLONOPIN) 0.25 MG disintegrating tablet, Take 0.25 mg by mouth 2 (two) times daily., Disp: , Rfl:  Social History   Socioeconomic History  . Marital status: Married    Spouse name: Not on file  . Number of children: Not on file  . Years of education: Not on file  . Highest education level: Not on file  Occupational History  . Occupation: disabled    Associate Professor: UNEMPLOYED  Social Needs  . Financial resource strain: Not on file  . Food insecurity:    Worry: Not on file    Inability: Not on file  . Transportation needs:    Medical: No    Non-medical: No  Tobacco Use  . Smoking status: Never Smoker  . Smokeless tobacco: Never Used  Substance and Sexual Activity  . Alcohol use: No  . Drug use: No  . Sexual activity: Never  Lifestyle  . Physical activity:    Days per week: 3 days    Minutes per session: 30 min  . Stress: Not on file  Relationships  . Social connections:    Talks on phone: Not on file    Gets together: Not on file    Attends religious service: Not on file    Active member of club or organization: Not on file    Attends meetings of clubs or organizations: Not on file    Relationship status: Not on file  . Intimate partner  violence:    Fear of current or ex partner: Not on file    Emotionally abused: Not on file    Physically abused: Not on file    Forced sexual activity: Not on file  Other Topics Concern  . Not on file  Social History Narrative  . Not on file   Family History  Problem Relation Age of Onset  . Coronary artery disease Father   . Colon polyps Father   . Prostate cancer Maternal Grandfather   . Hodgkin's lymphoma Son   . Liver cancer Paternal Uncle        ?  Marland Kitchen. Arthritis Mother   . Colon cancer Neg Hx     Objective: Office vital signs reviewed. BP 130/79   Pulse 87   Temp 98.7 F (37.1 C) (Oral)   Ht 6' (1.829 m)   Wt 222 lb (100.7 kg)   SpO2 99%   BMI 30.11 kg/m   Physical Examination:  General: Awake, alert, well nourished, well appearing. No acute distress HEENT: Normal    Neck: No masses palpated. No lymphadenopathy    Ears: Tympanic membranes intact, normal light reflex, no erythema, no bulging    Eyes: PERRLA, extraocular membranes intact, sclera white    Nose: nasal turbinates moist, clear nasal discharge    Throat: moist mucus membranes, no erythema, no tonsillar exudate.  Airway is patent Cardio: regular rate and rhythm, S1S2 heard, no murmurs appreciated Pulm: clear to auscultation bilaterally, no wheezes, rhonchi or rales; normal work of breathing on room air  Assessment/ Plan: 57 y.o. female   1. Viral URI with cough Patient is afebrile nontoxic-appearing with normal vital signs.  Pulse ox is within normal limits.  Physical exam is unremarkable.  Because she has an upcoming surgery, I did go ahead and obtain a chest x-ray to further evaluate the cough and rule out any possible pneumonia.  Personal review of x-ray did not demonstrate any acute pulmonary infiltrates.  No acute abnormalities noted.  Awaiting formal read by radiologist.  Supportive care recommended.  Suspect viral URI.  Tessalon Perles prescribed for cough as needed.  Follow-up PRN.  2. Cough -  DG Chest 2 View; Future   Orders Placed This Encounter  Procedures  . DG Chest 2 View    Standing Status:   Future    Number of Occurrences:   1    Standing Expiration Date:   01/23/2018    Order Specific Question:   Reason  for Exam (SYMPTOM  OR DIAGNOSIS REQUIRED)    Answer:   cough    Order Specific Question:   Is patient pregnant?    Answer:   No    Order Specific Question:   Preferred imaging location?    Answer:   Internal    Order Specific Question:   Radiology Contrast Protocol - do NOT remove file path    Answer:   [email or web address]   Meds ordered this encounter  Medications  . benzonatate (TESSALON PERLES) 100 MG capsule    Sig: Take 1 capsule (100 mg total) by mouth 3 (three) times daily as needed.    Dispense:  20 capsule    Refill:  0     Ashly Hulen Skains, DO [facility] [phone]

## 2017-12-24 NOTE — Patient Instructions (Signed)
Your chest x-ray did not demonstrate anything that suggests a bacterial infection in your lungs.  I suspect this is a cold virus that you are dealing with.  I have sent in a cough suppressant that you may take 3 times a day if needed for cough.  Will not interact with your other medications and will not cause sedation.  Good luck with your surgery!   It appears that you have a viral upper respiratory infection (cold).  Cold symptoms can last up to 2 weeks.    - Get plenty of rest and drink plenty of fluids. - Try to breathe moist air. Use a cold mist humidifier. - Consume warm fluids (soup or tea) to provide relief for a stuffy nose and to loosen phlegm. - For nasal stuffiness, try saline nasal spray or a Neti Pot. Afrin nasal spray can also be used but this product should not be used longer than 3 days or it will cause rebound nasal stuffiness (worsening nasal congestion). - For sore throat pain relief: use chloraseptic spray, suck on throat lozenges, hard candy or popsicles; gargle with warm salt water (1/4 tsp. salt per 8 oz. of water); and eat soft, bland foods. - Eat a well-balanced diet. If you cannot, ensure you are getting enough nutrients by taking a daily multivitamin. - Avoid dairy products, as they can thicken phlegm. - Avoid alcohol, as it impairs your body's immune system.  CONTACT YOUR DOCTOR IF YOU EXPERIENCE ANY OF THE FOLLOWING: - High fever - Ear pain - Sinus-type headache - Unusually severe cold symptoms - Cough that gets worse while other cold symptoms improve - Flare up of any chronic lung problem, such as asthma - Your symptoms persist longer than 2 weeks

## 2017-12-26 ENCOUNTER — Encounter: Payer: Self-pay | Admitting: Orthopaedic Surgery

## 2017-12-26 DIAGNOSIS — M659 Synovitis and tenosynovitis, unspecified: Secondary | ICD-10-CM | POA: Diagnosis not present

## 2017-12-26 DIAGNOSIS — M7542 Impingement syndrome of left shoulder: Secondary | ICD-10-CM | POA: Diagnosis not present

## 2017-12-26 DIAGNOSIS — S46012S Strain of muscle(s) and tendon(s) of the rotator cuff of left shoulder, sequela: Secondary | ICD-10-CM | POA: Diagnosis not present

## 2017-12-26 DIAGNOSIS — M19012 Primary osteoarthritis, left shoulder: Secondary | ICD-10-CM | POA: Diagnosis not present

## 2017-12-26 DIAGNOSIS — G8918 Other acute postprocedural pain: Secondary | ICD-10-CM | POA: Diagnosis not present

## 2017-12-26 DIAGNOSIS — S46112A Strain of muscle, fascia and tendon of long head of biceps, left arm, initial encounter: Secondary | ICD-10-CM | POA: Diagnosis not present

## 2017-12-28 ENCOUNTER — Other Ambulatory Visit: Payer: Self-pay | Admitting: Family

## 2017-12-28 DIAGNOSIS — M150 Primary generalized (osteo)arthritis: Secondary | ICD-10-CM

## 2017-12-28 DIAGNOSIS — M542 Cervicalgia: Secondary | ICD-10-CM

## 2017-12-28 DIAGNOSIS — M159 Polyosteoarthritis, unspecified: Secondary | ICD-10-CM

## 2017-12-28 DIAGNOSIS — M8949 Other hypertrophic osteoarthropathy, multiple sites: Secondary | ICD-10-CM

## 2017-12-30 NOTE — Telephone Encounter (Signed)
OV 12/10/17 rtc 6 mos, CMP was done

## 2018-01-01 ENCOUNTER — Encounter (INDEPENDENT_AMBULATORY_CARE_PROVIDER_SITE_OTHER): Payer: Self-pay | Admitting: Orthopaedic Surgery

## 2018-01-01 ENCOUNTER — Ambulatory Visit (INDEPENDENT_AMBULATORY_CARE_PROVIDER_SITE_OTHER): Payer: Medicare HMO | Admitting: Orthopaedic Surgery

## 2018-01-01 VITALS — BP 130/79 | HR 83

## 2018-01-01 DIAGNOSIS — G8929 Other chronic pain: Secondary | ICD-10-CM

## 2018-01-01 DIAGNOSIS — M25512 Pain in left shoulder: Secondary | ICD-10-CM

## 2018-01-01 NOTE — Progress Notes (Signed)
Office Visit Note   Patient: Molly Castaneda           Date of Birth: 05-20-1960           MRN: [id number] Visit Date: 01/01/2018              Requested by: [name], FNP [address] [place], [place] [address] PCP: [name], FNP   Assessment & Plan: Visit Diagnoses:  1. Chronic left shoulder pain     Plan: 6 days status post rotator cuff tear repair left shoulder and doing well.  Wearing the sling.  Was on morphine preoperatively for chronic pain and continues on the same.  We will start physical therapy for passive range of motion return to the office in 2 weeks  Follow-Up Instructions: Return in about 2 weeks (around 01/15/2018).   Orders:  Orders Placed This Encounter  Procedures  . Ambulatory referral to Physical Therapy   No orders of the defined types were placed in this encounter.     Procedures: No procedures performed   Clinical Data: No additional findings.   Subjective: Chief Complaint  Patient presents with  . Left Shoulder - Routine Post Op    12/26/17 Left shoulder scope with debridement of partial rotator cuff tear, subacromial decompression, mini open rotator cuff repair, and distal clavicle resection  Patient returns for first post op visit. She is status post left shoulder arthroscopy with debridement of partial rotator cuff tear, SAD, mini open rotator cuff repair, and distal clavicle resection on 12/26/17. She continues to wear the sling all of the time. She states that she continues to have a lot of pain. She is routinely on morphine for chronic pain, and has added tylenol with relief.  HPI  Review of Systems   Objective: Vital Signs: BP 130/79   Pulse 83   Physical Exam  Ortho Exam shoulder incisions healing without problem.  Vascular exam intact with minimal ecchymosis left shoulder  Specialty Comments:  No specialty comments available.  Imaging: No results found.   PMFS History: Patient Active Problem List     Diagnosis Date Noted  . Chronic left shoulder pain 09/18/2017  . Cervicalgia 09/18/2017  . Obesity (BMI 30.0-34.9) 05/20/2017  . Peripheral edema 09/07/2015  . Vitamin D deficiency 06/22/2014  . Depression 06/21/2014  . GAD (generalized anxiety disorder) 06/21/2014  . Back pain 06/21/2014  . Osteopenia 02/11/2013  . Constipation 05/08/2010   Past Medical History:  Diagnosis Date  . Arthritis   . Chronic back pain   . Chronic constipation   . Depression   . MVP (mitral valve prolapse)    had ablation 1998  . Numbness in left leg    s/p spinal surgery  . Supraventricular tachycardia (HCC)    ablation 1998  . Vitamin D deficiency     Family History  Problem Relation Age of Onset  . Coronary artery disease Father   . Colon polyps Father   . Prostate cancer Maternal Grandfather   . Hodgkin's lymphoma Son   . Liver cancer Paternal Uncle        ?  Marland Kitchen Arthritis Mother   . Colon cancer Neg Hx     Past Surgical History:  Procedure Laterality Date  . AV NODE ABLATION  1998   for svt  . BACK SURGERY  2001   lumbar fusion  . PARTIAL HYSTERECTOMY  1993  . REPAIR EXTENSOR TENDON Left 03/06/2013   Procedure: LEFT LONG BOUTONNIERE  REPAIR;  Surgeon: Wyn Forsterobert V Sypher Jr., MD;  Location: [facility];  Service: Orthopedics;  Laterality: Left;  . SPINE SURGERY    . stimulation system implant  07/31/00   lumbar nerve stimulator-none funtioning now   Social History   Occupational History  . Occupation: disabled    Associate Professormployer: UNEMPLOYED  Tobacco Use  . Smoking status: Never Smoker  . Smokeless tobacco: Never Used  Substance and Sexual Activity  . Alcohol use: No  . Drug use: No  . Sexual activity: Never

## 2018-01-14 ENCOUNTER — Encounter: Payer: Self-pay | Admitting: Physical Therapy

## 2018-01-14 ENCOUNTER — Ambulatory Visit: Payer: Medicare HMO | Attending: Orthopaedic Surgery | Admitting: Physical Therapy

## 2018-01-14 ENCOUNTER — Other Ambulatory Visit: Payer: Self-pay

## 2018-01-14 DIAGNOSIS — M25612 Stiffness of left shoulder, not elsewhere classified: Secondary | ICD-10-CM | POA: Insufficient documentation

## 2018-01-14 DIAGNOSIS — G8929 Other chronic pain: Secondary | ICD-10-CM | POA: Diagnosis present

## 2018-01-14 DIAGNOSIS — M6281 Muscle weakness (generalized): Secondary | ICD-10-CM

## 2018-01-14 DIAGNOSIS — M25512 Pain in left shoulder: Secondary | ICD-10-CM | POA: Insufficient documentation

## 2018-01-14 NOTE — Therapy (Addendum)
[facility] [address] [place], [place], [address] Phone: [phone]   Fax:  [phone]  Physical Therapy Evaluation  Patient Details  Name: Molly Castaneda MRN: [id number] Date of Birth: 1960-07-13 Referring Provider (PT): Norlene Campbell, MD   Encounter Date: 01/14/2018  PT End of Session - 01/14/18 0857    Visit Number  1    Number of Visits  18    Date for PT Re-Evaluation  03/04/18    Authorization Type  Progress note every 10th visit.    PT Start Time  0815    PT Stop Time  0900    PT Time Calculation (min)  45 min    Equipment Utilized During Treatment  Other (comment)   left shoulder sling   Activity Tolerance  Patient limited by pain    Behavior During Therapy  [facility] for tasks assessed/performed       Past Medical History:  Diagnosis Date  . Arthritis   . Chronic back pain   . Chronic constipation   . Depression   . MVP (mitral valve prolapse)    had ablation 1998  . Numbness in left leg    s/p spinal surgery  . Supraventricular tachycardia (HCC)    ablation 1998  . Vitamin D deficiency     Past Surgical History:  Procedure Laterality Date  . AV NODE ABLATION  1998   for svt  . BACK SURGERY  2001   lumbar fusion  . PARTIAL HYSTERECTOMY  1993  . REPAIR EXTENSOR TENDON Left 03/06/2013   Procedure: LEFT LONG BOUTONNIERE REPAIR;  Surgeon: Wyn Forster., MD;  Location: [facility];  Service: Orthopedics;  Laterality: Left;  . SPINE SURGERY    . stimulation system implant  07/31/00   lumbar nerve stimulator-none funtioning now    There were no vitals filed for this visit.   Subjective Assessment - 01/14/18 1035    Subjective  Patient arrives to physical therapy with her husband with left shoulder pain due left rotator cuff repair on 12/26/2017. Patient has sling donned. She requires assistance from her husband with all ADLs. She has been complaint with HEP provided by the surgeon but notes  she intermittently experiences popping with exercise. Patient report left shoulder discomfort while sleeping and states she sleeps with a pillow under her arm and with her head of and foot of the bed elevated. Patient reports pain at worst as 8/10 and pain at worst 6/10. Patient's goal is to decrease pain, improve movement, improve strength, and improve ability to perform home activities.    Patient is accompained by:  Family member   Husband   Pertinent History  left RTC Repair 12/26/17; chronic pain, depression, internal stimulator but "dead"    Limitations  House hold activities;Lifting    Diagnostic tests  X-Ray normal    Patient Stated Goals  less pain, use my arm better    Currently in Pain?  Yes    Pain Score  8     Pain Location  Shoulder    Pain Orientation  Left    Pain Descriptors / Indicators  Aching;Discomfort    Pain Type  Surgical pain    Pain Onset  1 to 4 weeks ago    Pain Frequency  Intermittent    Aggravating Factors   movement    Pain Relieving Factors  rest; pain medication    Effect of Pain on Daily Activities  needs assistance for all ADLs  [facility] - 01/14/18 0001      Assessment   Medical Diagnosis  Chronic left shoulder pain, S/P left RTC repair    Referring Provider (PT)  Norlene CampbellPeter Whitfield, MD    Onset Date/Surgical Date  12/26/17    Hand Dominance  Right    Next MD Visit  01/15/18    Prior Therapy  yes      Precautions   Precautions  Shoulder    Type of Shoulder Precautions  rotator cuff repair    Shoulder Interventions  Shoulder sling/immobilizer      Balance Screen   Has the patient fallen in the past 6 months  No    Has the patient had a decrease in activity level because of a fear of falling?   No    Is the patient reluctant to leave their home because of a fear of falling?   No      Prior Function   Level of Independence  Needs assistance with ADLs      Posture/Postural Control   Posture/Postural Control  Postural  limitations    Postural Limitations  Rounded Shoulders;Forward head;Decreased thoracic kyphosis      ROM / Strength   AROM / PROM / Strength  PROM      PROM   Overall PROM   Deficits;Due to pain    PROM Assessment Site  Shoulder    Right/Left Shoulder  Left    Left Shoulder Flexion  43 Degrees    Left Shoulder External Rotation  15 Degrees      Palpation   Palpation comment  Tenderness to left deltoid left UT.      Transfers   Comments  supervision for transfers and bed mobility; increased time to perform bed mobility                Objective measurements completed on examination: See above findings.      [facility] Adult PT Treatment/Exercise - 01/14/18 0001      Modalities   Modalities  Vasopneumatic      Vasopneumatic   Number Minutes Vasopneumatic   15 minutes    Vasopnuematic Location   Shoulder    Vasopneumatic Pressure  Low    Vasopneumatic Temperature   62             PT Education - 01/14/18 1309    Education Details  pendulums circles and AP    Person(s) Educated  Patient    Methods  Explanation;Demonstration;Handout    Comprehension  Verbalized understanding       PT Short Term Goals - 01/14/18 0921      PT SHORT TERM GOAL #1   Title  Patient will be independent with initial HEP    Time  3    Period  Weeks    Status  New      PT SHORT TERM GOAL #2   Title  Patient will demonstrate 90+ degrees of left shoulder flexion PROM.    Time  3    Period  Weeks    Status  New      PT SHORT TERM GOAL #3   Title  Patient will demonstrate 20+ degrees of left shoulder ER PROM    Time  3    Period  Weeks    Status  New        PT Long Term Goals - 01/14/18 [id number]      PT LONG TERM GOAL #1   Title  Patient  will be independent with advanced HEP    Time  6    Period  Weeks    Status  New      PT LONG TERM GOAL #2   Title  Patient will demonstrate 120+ degrees of left shoulder flexion AROM to improve ability to perform functional tasks.     Time  6    Period  Weeks    Status  New      PT LONG TERM GOAL #3   Title  Patient will demonstrate 60+ degrees of left shoulder external rotation AROM to improve ability to don/doff apparel    Time  6    Period  Weeks    Status  New      PT LONG TERM GOAL #4   Title  Patient will demonstrate 4/5 or greater left shoulder MMT in all planes to improve stability during functional tasks.    Time  6    Period  Weeks    Status  New      PT LONG TERM GOAL #5   Title  Patient will report ability to perform ADLs with left shoulder pain less than or equal to 4/10.    Time  6    Period  Weeks    Status  New             Plan - 01/14/18 1253    Clinical Impression Statement  Patient is a 57 year old female who presents to physical therapy with decreased left shoulder PROM and left shoulder pain secondary to s/p left shoulder rotator cuff repair. Patient reported tenderness to palpation to left deltoid and left UT. Patient requires supervision for transfers and for bed mobility. Patient, patient's husband and PT reviewed pendulum exercises to which patient and patient's husband reported understanding. Patient would benefit from skilled physical therapy to address deficits and address patient's goals.    Clinical Presentation  Stable    Clinical Decision Making  Moderate    Rehab Potential  Fair    Clinical Impairments Affecting Rehab Potential  chronic pain    PT Frequency  2x / week    PT Duration  6 weeks    PT Treatment/Interventions  Cryotherapy;ADLs/Self Care Home Management;Moist Heat;Therapeutic exercise;Therapeutic activities;Balance training;Neuromuscular re-education;Patient/family education;Manual techniques;Passive range of motion;Ultrasound;Electrical Stimulation    PT Next Visit Plan  PROM to left shoulder; modalties PRN for pain relief.    Consulted and Agree with Plan of Care  Patient       Patient will benefit from skilled therapeutic intervention in order to improve  the following deficits and impairments:  Pain, Impaired UE functional use, Decreased activity tolerance, Decreased range of motion, Decreased strength, Postural dysfunction  Visit Diagnosis: Chronic left shoulder pain - Plan: PT plan of care cert/re-cert  Stiffness of left shoulder, not elsewhere classified - Plan: PT plan of care cert/re-cert  Muscle weakness (generalized) - Plan: PT plan of care cert/re-cert     Problem List Patient Active Problem List   Diagnosis Date Noted  . Chronic left shoulder pain 09/18/2017  . Cervicalgia 09/18/2017  . Obesity (BMI 30.0-34.9) 05/20/2017  . Peripheral edema 09/07/2015  . Vitamin D deficiency 06/22/2014  . Depression 06/21/2014  . GAD (generalized anxiety disorder) 06/21/2014  . Back pain 06/21/2014  . Osteopenia 02/11/2013  . Constipation 05/08/2010    Guss Bunde, PT, DPT 01/14/2018, 1:09 PM  [facility] [address] [place], [place], [address] Phone: [phone]   Fax:  [phone]  Name: Molly Castaneda MRN: [id number] Date of Birth: 12/18/1960

## 2018-01-15 ENCOUNTER — Ambulatory Visit (INDEPENDENT_AMBULATORY_CARE_PROVIDER_SITE_OTHER): Payer: Medicare HMO | Admitting: Orthopaedic Surgery

## 2018-01-15 ENCOUNTER — Ambulatory Visit: Payer: Medicare HMO | Admitting: Physical Therapy

## 2018-01-15 ENCOUNTER — Encounter (INDEPENDENT_AMBULATORY_CARE_PROVIDER_SITE_OTHER): Payer: Self-pay | Admitting: Orthopaedic Surgery

## 2018-01-15 VITALS — BP 126/78 | HR 86 | Ht 72.0 in | Wt 220.0 lb

## 2018-01-15 DIAGNOSIS — M25612 Stiffness of left shoulder, not elsewhere classified: Secondary | ICD-10-CM

## 2018-01-15 DIAGNOSIS — G8929 Other chronic pain: Secondary | ICD-10-CM

## 2018-01-15 DIAGNOSIS — M25512 Pain in left shoulder: Principal | ICD-10-CM

## 2018-01-15 DIAGNOSIS — M6281 Muscle weakness (generalized): Secondary | ICD-10-CM

## 2018-01-15 NOTE — Therapy (Addendum)
[facility] [address] [place], [place], [address] Phone: [phone]   Fax:  [phone]  Physical Therapy Treatment  Patient Details  Name: Molly HarperShelia A Mccorkle MRN: [id number] Date of Birth: [birth year] Referring Provider (PT): Norlene CampbellPeter Whitfield, MD   Encounter Date: 01/15/2018  PT End of Session - 01/15/18 0734    Visit Number  2    Number of Visits  18    Date for PT Re-Evaluation  03/04/18    Authorization Type  Progress note every 10th visit.    PT Start Time  0730    PT Stop Time  0820    PT Time Calculation (min)  50 min    Equipment Utilized During Treatment  Other (comment)   sling   Activity Tolerance  Patient limited by pain    Behavior During Therapy  [facility] for tasks assessed/performed       Past Medical History:  Diagnosis Date  . Arthritis   . Chronic back pain   . Chronic constipation   . Depression   . MVP (mitral valve prolapse)    had ablation 1998  . Numbness in left leg    s/p spinal surgery  . Supraventricular tachycardia (HCC)    ablation 1998  . Vitamin D deficiency     Past Surgical History:  Procedure Laterality Date  . AV NODE ABLATION  1998   for svt  . BACK SURGERY  2001   lumbar fusion  . PARTIAL HYSTERECTOMY  1993  . REPAIR EXTENSOR TENDON Left 03/06/2013   Procedure: LEFT LONG BOUTONNIERE REPAIR;  Surgeon: Wyn Forsterobert V Sypher Jr., MD;  Location: [facility];  Service: Orthopedics;  Laterality: Left;  . SPINE SURGERY    . stimulation system implant  07/31/00   lumbar nerve stimulator-none funtioning now    There were no vitals filed for this visit.  Subjective Assessment - 01/15/18 0734    Subjective  Patient reports feeling alright this morning and had her husband help with dressing. She sees the MD later today.    Patient is accompained by:  Family member    Pertinent History  left RTC Repair 12/26/17; chronic pain, depression, internal stimulator but "dead"    Limitations   House hold activities;Lifting    Diagnostic tests  X-Ray normal    Patient Stated Goals  less pain, use my arm better    Currently in Pain?  Yes    Pain Score  6     Pain Location  Shoulder    Pain Orientation  Left    Pain Descriptors / Indicators  Discomfort;Sore    Pain Type  Surgical pain    Pain Onset  1 to 4 weeks ago    Pain Frequency  Constant         [facility] PT Assessment - 01/15/18 0001      Assessment   Medical Diagnosis  Chronic left shoulder pain, S/P left RTC repair    Referring Provider (PT)  Norlene CampbellPeter Whitfield, MD    Onset Date/Surgical Date  12/26/17    Hand Dominance  Right    Next MD Visit  01/15/18    Prior Therapy  yes      Precautions   Precautions  Shoulder    Type of Shoulder Precautions  rotator cuff repair    Shoulder Interventions  Shoulder sling/immobilizer                   [facility] Adult PT Treatment/Exercise -  01/15/18 0001      Vasopneumatic   Number Minutes Vasopneumatic   15 minutes    Vasopnuematic Location   Shoulder    Vasopneumatic Pressure  Low    Vasopneumatic Temperature   34      Manual Therapy   Manual Therapy  Passive ROM    Passive ROM  PROM to left shoulder in ER and flexion                PT Short Term Goals - 01/14/18 1610      PT SHORT TERM GOAL #1   Title  Patient will be independent with initial HEP    Time  3    Period  Weeks    Status  New      PT SHORT TERM GOAL #2   Title  Patient will demonstrate 90+ degrees of left shoulder flexion PROM.    Time  3    Period  Weeks    Status  New      PT SHORT TERM GOAL #3   Title  Patient will demonstrate 20+ degrees of left shoulder ER PROM    Time  3    Period  Weeks    Status  New        PT Long Term Goals - 01/14/18 9604      PT LONG TERM GOAL #1   Title  Patient will be independent with advanced HEP    Time  6    Period  Weeks    Status  New      PT LONG TERM GOAL #2   Title  Patient will demonstrate 120+ degrees of left shoulder  flexion AROM to improve ability to perform functional tasks.    Time  6    Period  Weeks    Status  New      PT LONG TERM GOAL #3   Title  Patient will demonstrate 60+ degrees of left shoulder external rotation AROM to improve ability to don/doff apparel    Time  6    Period  Weeks    Status  New      PT LONG TERM GOAL #4   Title  Patient will demonstrate 4/5 or greater left shoulder MMT in all planes to improve stability during functional tasks.    Time  6    Period  Weeks    Status  New      PT LONG TERM GOAL #5   Title  Patient will report ability to perform ADLs with left shoulder pain less than or equal to 4/10.    Time  6    Period  Weeks    Status  New            Plan - 01/15/18 5409    Clinical Impression Statement  Patient was able to tolerate treatment fairly well with some reports of increased pain. Patient noted with less guarding with PROM ER and flexion as PROM continued. Oscillations performed to maintain muscle relaxation. Normal response to modalities upon removal.     Clinical Presentation  Stable    Clinical Decision Making  Moderate    Rehab Potential  Fair    Clinical Impairments Affecting Rehab Potential  chronic pain    PT Frequency  3x / week    PT Duration  6 weeks    PT Treatment/Interventions  Cryotherapy;ADLs/Self Care Home Management;Moist Heat;Therapeutic exercise;Therapeutic activities;Balance training;Neuromuscular re-education;Patient/family education;Manual techniques;Passive range of motion;Ultrasound;Statistician  PT Next Visit Plan  PROM to left shoulder; modalties PRN for pain relief.    Consulted and Agree with Plan of Care  Patient       Patient will benefit from skilled therapeutic intervention in order to improve the following deficits and impairments:  Pain, Impaired UE functional use, Decreased activity tolerance, Decreased range of motion, Decreased strength, Postural dysfunction  Visit Diagnosis: Chronic left  shoulder pain - Plan: PT plan of care cert/re-cert  Stiffness of left shoulder, not elsewhere classified - Plan: PT plan of care cert/re-cert  Muscle weakness (generalized) - Plan: PT plan of care cert/re-cert     Problem List Patient Active Problem List   Diagnosis Date Noted  . Chronic left shoulder pain 09/18/2017  . Cervicalgia 09/18/2017  . Obesity (BMI 30.0-34.9) 05/20/2017  . Peripheral edema 09/07/2015  . Vitamin D deficiency 06/22/2014  . Depression 06/21/2014  . GAD (generalized anxiety disorder) 06/21/2014  . Back pain 06/21/2014  . Osteopenia 02/11/2013  . Constipation 05/08/2010    Guss Bunde, PT, DPT 01/15/2018, 8:57 AM  [facility] [address] [place], [place], [address] Phone: [phone]   Fax:  [phone]  Name: KASSY MCENROE MRN: [id number] Date of Birth: [birth year]

## 2018-01-15 NOTE — Addendum Note (Signed)
Addended by: Guss BundeMANGAWANG, Shannel Zahm on: 01/15/2018 08:58 AM   Modules accepted: Orders

## 2018-01-15 NOTE — Progress Notes (Signed)
Office Visit Note   Patient: Molly HarperShelia A Castaneda           Date of Birth: 01/22/1961           MRN: [id number] Visit Date: 01/15/2018              Requested by: Junie SpencerHawks, Christy A, FNP [address] [place], [place] [address] PCP: Junie SpencerHawks, Christy A, FNP   Assessment & Plan: Visit Diagnoses:  1. Chronic left shoulder pain     Plan: 3 weeks status post rotator cuff tear repair left shoulder.  Progressing in physical therapy.  Has limited flexion and abduction.  Needs to be more aggressive with motion.  Continue with the sling.  Office 2 weeks  Follow-Up Instructions: Return in about 2 weeks (around 01/29/2018).   Orders:  No orders of the defined types were placed in this encounter.  No orders of the defined types were placed in this encounter.     Procedures: No procedures performed   Clinical Data: No additional findings.   Subjective: Chief Complaint  Patient presents with  . Left Shoulder - Follow-up    12/26/17  Left Shoulder Arthroscopy, SAD, DCE, Mini Open Rotator Cuff Repair  Patient presents for follow up. She is status post left shoulder arthroscopy, SAD, DCE, and mini open rotator cuff repair on 12/26/17.  She has attended two physical therapy appointments which she states went well. She will continue with three appointments next week. She does continue to have pain but states that it is better than before surgery. She continues to wear the sling. She is taking morphine for chronic pain.  HPI  Review of Systems   Objective: Vital Signs: BP 126/78   Pulse 86   Ht 6' (1.829 m)   Wt 220 lb (99.8 kg)   BMI 29.84 kg/m   Physical Exam  Ortho Exam awake alert and oriented x3.  Comfortable sitting.  Incisions left shoulder healing without problem.  I can abduct to about 80 degrees and flex about the same.  She was hesitant to move any further.  Good grip and good release  Specialty Comments:  No specialty comments available.  Imaging: No results  found.   PMFS History: Patient Active Problem List   Diagnosis Date Noted  . Chronic left shoulder pain 09/18/2017  . Cervicalgia 09/18/2017  . Obesity (BMI 30.0-34.9) 05/20/2017  . Peripheral edema 09/07/2015  . Vitamin D deficiency 06/22/2014  . Depression 06/21/2014  . GAD (generalized anxiety disorder) 06/21/2014  . Back pain 06/21/2014  . Osteopenia 02/11/2013  . Constipation 05/08/2010   Past Medical History:  Diagnosis Date  . Arthritis   . Chronic back pain   . Chronic constipation   . Depression   . MVP (mitral valve prolapse)    had ablation 1998  . Numbness in left leg    s/p spinal surgery  . Supraventricular tachycardia (HCC)    ablation 1998  . Vitamin D deficiency     Family History  Problem Relation Age of Onset  . Coronary artery disease Father   . Colon polyps Father   . Prostate cancer Maternal Grandfather   . Hodgkin's lymphoma Son   . Liver cancer Paternal Uncle        ?  Marland Kitchen. Arthritis Mother   . Colon cancer Neg Hx     Past Surgical History:  Procedure Laterality Date  . AV NODE ABLATION  1998   for svt  . BACK SURGERY  2001  lumbar fusion  . PARTIAL HYSTERECTOMY  1993  . REPAIR EXTENSOR TENDON Left 03/06/2013   Procedure: LEFT LONG BOUTONNIERE REPAIR;  Surgeon: Wyn Forster., MD;  [facility];  Service: Orthopedics;  Laterality: Left;  . SPINE SURGERY    . stimulation system implant  07/31/00   lumbar nerve stimulator-none funtioning now   Social History   Occupational History  . Occupation: disabled    Associate Professor: UNEMPLOYED  Tobacco Use  . Smoking status: Never Smoker  . Smokeless tobacco: Never Used  Substance and Sexual Activity  . Alcohol use: No  . Drug use: No  . Sexual activity: Never

## 2018-01-20 ENCOUNTER — Ambulatory Visit: Payer: Medicare HMO

## 2018-01-20 ENCOUNTER — Ambulatory Visit: Payer: Medicare HMO | Admitting: Physical Therapy

## 2018-01-20 ENCOUNTER — Encounter: Payer: Self-pay | Admitting: Physical Therapy

## 2018-01-20 DIAGNOSIS — G8929 Other chronic pain: Secondary | ICD-10-CM

## 2018-01-20 DIAGNOSIS — M25512 Pain in left shoulder: Secondary | ICD-10-CM | POA: Diagnosis not present

## 2018-01-20 DIAGNOSIS — M25612 Stiffness of left shoulder, not elsewhere classified: Secondary | ICD-10-CM | POA: Diagnosis not present

## 2018-01-20 DIAGNOSIS — M6281 Muscle weakness (generalized): Secondary | ICD-10-CM

## 2018-01-20 NOTE — Therapy (Signed)
[facility] [address] [place], [place], [address] Phone: [phone]   Fax:  [phone]  Physical Therapy Treatment  Patient Details  Name: HALLEIGH COMES MRN: [id number] Date of Birth: January 20, 1961 Referring Provider (PT): Norlene Campbell, MD   Encounter Date: 01/20/2018  PT End of Session - 01/20/18 1147    Visit Number  3    Number of Visits  18    Date for PT Re-Evaluation  03/04/18    Authorization Type  Progress note every 10th visit.    PT Start Time  1115    PT Stop Time  1202    PT Time Calculation (min)  47 min    Activity Tolerance  Patient tolerated treatment well;Patient limited by pain    Behavior During Therapy  [facility] for tasks assessed/performed       Past Medical History:  Diagnosis Date  . Arthritis   . Chronic back pain   . Chronic constipation   . Depression   . MVP (mitral valve prolapse)    had ablation 1998  . Numbness in left leg    s/p spinal surgery  . Supraventricular tachycardia (HCC)    ablation 1998  . Vitamin D deficiency     Past Surgical History:  Procedure Laterality Date  . AV NODE ABLATION  1998   for svt  . BACK SURGERY  2001   lumbar fusion  . PARTIAL HYSTERECTOMY  1993  . REPAIR EXTENSOR TENDON Left 03/06/2013   Procedure: LEFT LONG BOUTONNIERE REPAIR;  Surgeon: Wyn Forster., MD;  Location: [facility];  Service: Orthopedics;  Laterality: Left;  . SPINE SURGERY    . stimulation system implant  07/31/00   lumbar nerve stimulator-none funtioning now    There were no vitals filed for this visit.  Subjective Assessment - 01/20/18 1128    Subjective  Patient arrived doing well with ongoing discomfort    Patient is accompained by:  Family member    Pertinent History  left RTC Repair 12/26/17; chronic pain, depression, internal stimulator but "dead"    Limitations  House hold activities;Lifting    Diagnostic tests  X-Ray normal    Patient Stated Goals  less  pain, use my arm better    Currently in Pain?  Yes    Pain Score  6     Pain Location  Shoulder    Pain Orientation  Left    Pain Descriptors / Indicators  Discomfort;Sore    Pain Type  Surgical pain    Pain Onset  1 to 4 weeks ago    Pain Frequency  Constant    Aggravating Factors   movement    Pain Relieving Factors  rest meds                       [facility] Adult PT Treatment/Exercise - 01/20/18 0001      Vasopneumatic   Number Minutes Vasopneumatic   15 minutes    Vasopnuematic Location   Shoulder    Vasopneumatic Pressure  Low      Manual Therapy   Manual Therapy  Passive ROM    Passive ROM  PROM to left shoulder in ER and flexion , ossilations to help relax muscles               PT Short Term Goals - 01/14/18 0921      PT SHORT TERM GOAL #1   Title  Patient will  be independent with initial HEP    Time  3    Period  Weeks    Status  New      PT SHORT TERM GOAL #2   Title  Patient will demonstrate 90+ degrees of left shoulder flexion PROM.    Time  3    Period  Weeks    Status  New      PT SHORT TERM GOAL #3   Title  Patient will demonstrate 20+ degrees of left shoulder ER PROM    Time  3    Period  Weeks    Status  New        PT Long Term Goals - 01/14/18 8119      PT LONG TERM GOAL #1   Title  Patient will be independent with advanced HEP    Time  6    Period  Weeks    Status  New      PT LONG TERM GOAL #2   Title  Patient will demonstrate 120+ degrees of left shoulder flexion AROM to improve ability to perform functional tasks.    Time  6    Period  Weeks    Status  New      PT LONG TERM GOAL #3   Title  Patient will demonstrate 60+ degrees of left shoulder external rotation AROM to improve ability to don/doff apparel    Time  6    Period  Weeks    Status  New      PT LONG TERM GOAL #4   Title  Patient will demonstrate 4/5 or greater left shoulder MMT in all planes to improve stability during functional tasks.    Time   6    Period  Weeks    Status  New      PT LONG TERM GOAL #5   Title  Patient will report ability to perform ADLs with left shoulder pain less than or equal to 4/10.    Time  6    Period  Weeks    Status  New            Plan - 01/20/18 1148    Clinical Impression Statement  Patient tolerated treatment fair today. Patient has 6/10 pain esp reported soreness with movement into flexion. Patient progressing with PROM for flexion and ER today and required ossilations to help relax for flexion. Patient goals ongoing at this time.     Rehab Potential  Fair    PT Frequency  3x / week    PT Duration  6 weeks    PT Treatment/Interventions  Cryotherapy;ADLs/Self Care Home Management;Moist Heat;Therapeutic exercise;Therapeutic activities;Balance training;Neuromuscular re-education;Patient/family education;Manual techniques;Passive range of motion;Ultrasound;Electrical Stimulation    PT Next Visit Plan  PROM to left shoulder; modalties PRN for pain relief.    Consulted and Agree with Plan of Care  Patient       Patient will benefit from skilled therapeutic intervention in order to improve the following deficits and impairments:  Pain, Impaired UE functional use, Decreased activity tolerance, Decreased range of motion, Decreased strength, Postural dysfunction  Visit Diagnosis: Chronic left shoulder pain  Stiffness of left shoulder, not elsewhere classified  Muscle weakness (generalized)     Problem List Patient Active Problem List   Diagnosis Date Noted  . Chronic left shoulder pain 09/18/2017  . Cervicalgia 09/18/2017  . Obesity (BMI 30.0-34.9) 05/20/2017  . Peripheral edema 09/07/2015  . Vitamin D deficiency 06/22/2014  . Depression 06/21/2014  .  GAD (generalized anxiety disorder) 06/21/2014  . Back pain 06/21/2014  . Osteopenia 02/11/2013  . Constipation 05/08/2010    Hermelinda DellenDUNFORD, Cherrelle Plante P, PTA 01/20/2018, 12:03 PM  [facility] [address] [place], [place], [address] Phone: [phone]   Fax:  [phone]  Name: Shannan HarperShelia A Sweda MRN: [id number] Date of Birth: 12-31-1960

## 2018-01-22 ENCOUNTER — Ambulatory Visit: Payer: Medicare HMO | Admitting: Physical Therapy

## 2018-01-22 DIAGNOSIS — M6281 Muscle weakness (generalized): Secondary | ICD-10-CM | POA: Diagnosis not present

## 2018-01-22 DIAGNOSIS — M25512 Pain in left shoulder: Secondary | ICD-10-CM | POA: Diagnosis not present

## 2018-01-22 DIAGNOSIS — M25612 Stiffness of left shoulder, not elsewhere classified: Secondary | ICD-10-CM | POA: Diagnosis not present

## 2018-01-22 DIAGNOSIS — G8929 Other chronic pain: Secondary | ICD-10-CM

## 2018-01-22 NOTE — Therapy (Signed)
[facility] [address] [place], [place], [address] Phone: [phone]   Fax:  [phone]  Physical Therapy Treatment  Patient Details  Name: Molly Castaneda MRN: [id number] Date of Birth: [birth year] Referring Provider (PT): Norlene CampbellPeter Whitfield, MD   Encounter Date: 01/22/2018  PT End of Session - 01/22/18 1212    Visit Number  4    Number of Visits  18    Date for PT Re-Evaluation  03/04/18    Authorization Type  Progress note every 10th visit.    PT Start Time  1115    PT Stop Time  1208    PT Time Calculation (min)  53 min    Equipment Utilized During Treatment  Other (comment)    Activity Tolerance  Patient tolerated treatment well;Patient limited by pain    Behavior During Therapy  [facility] for tasks assessed/performed       Past Medical History:  Diagnosis Date  . Arthritis   . Chronic back pain   . Chronic constipation   . Depression   . MVP (mitral valve prolapse)    had ablation 1998  . Numbness in left leg    s/p spinal surgery  . Supraventricular tachycardia (HCC)    ablation 1998  . Vitamin D deficiency     Past Surgical History:  Procedure Laterality Date  . AV NODE ABLATION  1998   for svt  . BACK SURGERY  2001   lumbar fusion  . PARTIAL HYSTERECTOMY  1993  . REPAIR EXTENSOR TENDON Left 03/06/2013   Procedure: LEFT LONG BOUTONNIERE REPAIR;  Surgeon: Wyn Forsterobert V Sypher Jr., MD;  Location: [facility];  Service: Orthopedics;  Laterality: Left;  . SPINE SURGERY    . stimulation system implant  07/31/00   lumbar nerve stimulator-none funtioning now    There were no vitals filed for this visit.      [facility] - 01/22/18 0001      Assessment   Medical Diagnosis  Chronic left shoulder pain, S/P left RTC repair    Referring Provider (PT)  Norlene CampbellPeter Whitfield, MD    Onset Date/Surgical Date  12/26/17    Hand Dominance  Right    Next MD Visit  02/03/18    Prior Therapy  yes      Precautions    Precautions  Shoulder    Type of Shoulder Precautions  rotator cuff repair    Shoulder Interventions  Shoulder sling/immobilizer      PROM   Left Shoulder Flexion  85 Degrees    Left Shoulder External Rotation  33 Degrees                   [facility] Adult PT Treatment/Exercise - 01/22/18 0001      Vasopneumatic   Number Minutes Vasopneumatic   15 minutes    Vasopnuematic Location   Shoulder    Vasopneumatic Pressure  Low      Manual Therapy   Manual Therapy  Passive ROM    Passive ROM  PROM to left shoulder in ER and flexion , ossilations to help relax muscles               PT Short Term Goals - 01/14/18 [id number]      PT SHORT TERM GOAL #1   Title  Patient will be independent with initial HEP    Time  3    Period  Weeks    Status  New  PT SHORT TERM GOAL #2   Title  Patient will demonstrate 90+ degrees of left shoulder flexion PROM.    Time  3    Period  Weeks    Status  New      PT SHORT TERM GOAL #3   Title  Patient will demonstrate 20+ degrees of left shoulder ER PROM    Time  3    Period  Weeks    Status  New        PT Long Term Goals - 01/14/18 1610      PT LONG TERM GOAL #1   Title  Patient will be independent with advanced HEP    Time  6    Period  Weeks    Status  New      PT LONG TERM GOAL #2   Title  Patient will demonstrate 120+ degrees of left shoulder flexion AROM to improve ability to perform functional tasks.    Time  6    Period  Weeks    Status  New      PT LONG TERM GOAL #3   Title  Patient will demonstrate 60+ degrees of left shoulder external rotation AROM to improve ability to don/doff apparel    Time  6    Period  Weeks    Status  New      PT LONG TERM GOAL #4   Title  Patient will demonstrate 4/5 or greater left shoulder MMT in all planes to improve stability during functional tasks.    Time  6    Period  Weeks    Status  New      PT LONG TERM GOAL #5   Title  Patient will report ability to perform ADLs  with left shoulder pain less than or equal to 4/10.    Time  6    Period  Weeks    Status  New            Plan - 01/22/18 1214    Clinical Impression Statement  Patient was able to tolerate treatment well today. Patient continues to have pain especially with decending from shoulder flexion. Patient noted with improvements in PROM, see objective measurement. Normal response to modalitites upon removal.    Clinical Presentation  Stable    Clinical Decision Making  Moderate    Rehab Potential  Fair    Clinical Impairments Affecting Rehab Potential  chronic pain    PT Frequency  3x / week    PT Duration  6 weeks    PT Treatment/Interventions  Cryotherapy;ADLs/Self Care Home Management;Moist Heat;Therapeutic exercise;Therapeutic activities;Balance training;Neuromuscular re-education;Patient/family education;Manual techniques;Passive range of motion;Ultrasound;Electrical Stimulation    PT Next Visit Plan  PROM to left shoulder; modalties PRN for pain relief.    Consulted and Agree with Plan of Care  Patient       Patient will benefit from skilled therapeutic intervention in order to improve the following deficits and impairments:  Pain, Impaired UE functional use, Decreased activity tolerance, Decreased range of motion, Decreased strength, Postural dysfunction  Visit Diagnosis: Chronic left shoulder pain  Stiffness of left shoulder, not elsewhere classified  Muscle weakness (generalized)     Problem List Patient Active Problem List   Diagnosis Date Noted  . Chronic left shoulder pain 09/18/2017  . Cervicalgia 09/18/2017  . Obesity (BMI 30.0-34.9) 05/20/2017  . Peripheral edema 09/07/2015  . Vitamin D deficiency 06/22/2014  . Depression 06/21/2014  . GAD (generalized anxiety disorder) 06/21/2014  .  Back pain 06/21/2014  . Osteopenia 02/11/2013  . Constipation 05/08/2010   Guss Bunde, PT, DPT 01/22/2018, 2:00 PM  [facility] [address] [place], [place], [address] Phone: [phone]   Fax:  [phone]  Name: Molly Castaneda MRN: [id number] Date of Birth: 06-Jul-1960

## 2018-01-24 ENCOUNTER — Ambulatory Visit: Payer: Medicare HMO | Admitting: Physical Therapy

## 2018-01-24 DIAGNOSIS — M25512 Pain in left shoulder: Secondary | ICD-10-CM | POA: Diagnosis not present

## 2018-01-24 DIAGNOSIS — G8929 Other chronic pain: Secondary | ICD-10-CM | POA: Diagnosis not present

## 2018-01-24 DIAGNOSIS — M6281 Muscle weakness (generalized): Secondary | ICD-10-CM | POA: Diagnosis not present

## 2018-01-24 DIAGNOSIS — M25612 Stiffness of left shoulder, not elsewhere classified: Secondary | ICD-10-CM | POA: Diagnosis not present

## 2018-01-24 NOTE — Therapy (Signed)
[facility] [address] [place], [place], [address] Phone: [phone]   Fax:  [phone]  Physical Therapy Treatment  Patient Details  Name: Molly HarperShelia A Halbig MRN: [id number] Date of Birth: 05/25/1960 Referring Provider (PT): Norlene CampbellPeter Whitfield, MD   Encounter Date: 01/24/2018  PT End of Session - 01/24/18 1154    Visit Number  5    Number of Visits  18    Date for PT Re-Evaluation  03/04/18    Authorization Type  Progress note every 10th visit.    PT Start Time  1115    PT Stop Time  1207    PT Time Calculation (min)  52 min    Activity Tolerance  Patient tolerated treatment well    Behavior During Therapy  WFL for tasks assessed/performed       Past Medical History:  Diagnosis Date  . Arthritis   . Chronic back pain   . Chronic constipation   . Depression   . MVP (mitral valve prolapse)    had ablation 1998  . Numbness in left leg    s/p spinal surgery  . Supraventricular tachycardia (HCC)    ablation 1998  . Vitamin D deficiency     Past Surgical History:  Procedure Laterality Date  . AV NODE ABLATION  1998   for svt  . BACK SURGERY  2001   lumbar fusion  . PARTIAL HYSTERECTOMY  1993  . REPAIR EXTENSOR TENDON Left 03/06/2013   Procedure: LEFT LONG BOUTONNIERE REPAIR;  Surgeon: Wyn Forsterobert V Sypher Jr., MD;  Location: [facility];  Service: Orthopedics;  Laterality: Left;  . SPINE SURGERY    . stimulation system implant  07/31/00   lumbar nerve stimulator-none funtioning now    There were no vitals filed for this visit.  Subjective Assessment - 01/24/18 1154    Subjective  Patient reported moving in the bed wrong that caused discomfort throughout the night     Pertinent History  left RTC Repair 12/26/17; chronic pain, depression, internal stimulator but "dead"    Limitations  House hold activities;Lifting    Diagnostic tests  X-Ray normal    Patient Stated Goals  less pain, use my arm better    Currently  in Pain?  Yes    Pain Score  6     Pain Location  Shoulder    Pain Orientation  Left    Pain Descriptors / Indicators  Discomfort;Sore    Pain Type  Surgical pain    Pain Onset  1 to 4 weeks ago         [facility] - 01/24/18 0001      Assessment   Medical Diagnosis  Chronic left shoulder pain, S/P left RTC repair    Referring Provider (PT)  Norlene CampbellPeter Whitfield, MD    Onset Date/Surgical Date  12/26/17    Hand Dominance  Right    Next MD Visit  02/03/18    Prior Therapy  yes      Precautions   Precautions  Shoulder    Type of Shoulder Precautions  rotator cuff repair    Shoulder Interventions  Shoulder sling/immobilizer                   [facility] Adult PT Treatment/Exercise - 01/24/18 0001      Vasopneumatic   Number Minutes Vasopneumatic   15 minutes    Vasopnuematic Location   Shoulder    Vasopneumatic Pressure  Low  Vasopneumatic Temperature   34      Manual Therapy   Manual Therapy  Passive ROM    Passive ROM  PROM to left shoulder in ER and flexion , ossilations to help relax muscles               PT Short Term Goals - 01/14/18 0921      PT SHORT TERM GOAL #1   Title  Patient will be independent with initial HEP    Time  3    Period  Weeks    Status  New      PT SHORT TERM GOAL #2   Title  Patient will demonstrate 90+ degrees of left shoulder flexion PROM.    Time  3    Period  Weeks    Status  New      PT SHORT TERM GOAL #3   Title  Patient will demonstrate 20+ degrees of left shoulder ER PROM    Time  3    Period  Weeks    Status  New        PT Long Term Goals - 01/14/18 1610      PT LONG TERM GOAL #1   Title  Patient will be independent with advanced HEP    Time  6    Period  Weeks    Status  New      PT LONG TERM GOAL #2   Title  Patient will demonstrate 120+ degrees of left shoulder flexion AROM to improve ability to perform functional tasks.    Time  6    Period  Weeks    Status  New      PT LONG TERM GOAL  #3   Title  Patient will demonstrate 60+ degrees of left shoulder external rotation AROM to improve ability to don/doff apparel    Time  6    Period  Weeks    Status  New      PT LONG TERM GOAL #4   Title  Patient will demonstrate 4/5 or greater left shoulder MMT in all planes to improve stability during functional tasks.    Time  6    Period  Weeks    Status  New      PT LONG TERM GOAL #5   Title  Patient will report ability to perform ADLs with left shoulder pain less than or equal to 4/10.    Time  6    Period  Weeks    Status  New            Plan - 01/24/18 1159    Clinical Impression Statement  Patient was able to tolerate PROM well despite left shoulder discomfort at end ranges. Patient continues to have pain with decending from shoulder flexion. Patient instructed to continue pendulums to mainain mobility within the joint. Patient reported understanding. Normal response to modalities upon removal.     Clinical Presentation  Stable    Clinical Decision Making  Moderate    Rehab Potential  Fair    Clinical Impairments Affecting Rehab Potential  chronic pain    PT Frequency  3x / week    PT Duration  6 weeks    PT Treatment/Interventions  Cryotherapy;ADLs/Self Care Home Management;Moist Heat;Therapeutic exercise;Therapeutic activities;Balance training;Neuromuscular re-education;Patient/family education;Manual techniques;Passive range of motion;Ultrasound;Electrical Stimulation    PT Next Visit Plan  PROM to left shoulder; modalties PRN for pain relief.    Consulted and Agree with Plan of Care  Patient       Patient will benefit from skilled therapeutic intervention in order to improve the following deficits and impairments:  Pain, Impaired UE functional use, Decreased activity tolerance, Decreased range of motion, Decreased strength, Postural dysfunction  Visit Diagnosis: Chronic left shoulder pain  Stiffness of left shoulder, not elsewhere classified  Muscle  weakness (generalized)     Problem List Patient Active Problem List   Diagnosis Date Noted  . Chronic left shoulder pain 09/18/2017  . Cervicalgia 09/18/2017  . Obesity (BMI 30.0-34.9) 05/20/2017  . Peripheral edema 09/07/2015  . Vitamin D deficiency 06/22/2014  . Depression 06/21/2014  . GAD (generalized anxiety disorder) 06/21/2014  . Back pain 06/21/2014  . Osteopenia 02/11/2013  . Constipation 05/08/2010   Guss BundeKrystle Daryl Quiros, PT, DPT 01/24/2018, 12:07 PM  [facility] [address] [place], [place], [address] Phone: [phone]   Fax:  [phone]  Name: Molly HarperShelia A Hollinshead MRN: [id number] Date of Birth: [birth year]

## 2018-01-27 ENCOUNTER — Ambulatory Visit: Payer: Medicare HMO | Admitting: Physical Therapy

## 2018-01-27 ENCOUNTER — Encounter: Payer: Self-pay | Admitting: Physical Therapy

## 2018-01-27 DIAGNOSIS — G8929 Other chronic pain: Secondary | ICD-10-CM | POA: Diagnosis not present

## 2018-01-27 DIAGNOSIS — M6281 Muscle weakness (generalized): Secondary | ICD-10-CM | POA: Diagnosis not present

## 2018-01-27 DIAGNOSIS — M25512 Pain in left shoulder: Secondary | ICD-10-CM | POA: Diagnosis not present

## 2018-01-27 DIAGNOSIS — M25612 Stiffness of left shoulder, not elsewhere classified: Secondary | ICD-10-CM

## 2018-01-27 NOTE — Therapy (Signed)
[facility] [address] [place], [place], [address] Phone: [phone]   Fax:  [phone]  Physical Therapy Treatment  Patient Details  Name: Molly Castaneda MRN: [id number] Date of Birth: Jan 30, 1961 Referring Provider (PT): Norlene Campbell, MD   Encounter Date: 01/27/2018  PT End of Session - 01/27/18 0802    Visit Number  6    Number of Visits  18    Date for PT Re-Evaluation  03/04/18    Authorization Type  Progress note every 10th visit.    PT Start Time  0732    PT Stop Time  0816    PT Time Calculation (min)  44 min    Activity Tolerance  Patient tolerated treatment well    Behavior During Therapy  [facility] for tasks assessed/performed       Past Medical History:  Diagnosis Date  . Arthritis   . Chronic back pain   . Chronic constipation   . Depression   . MVP (mitral valve prolapse)    had ablation 1998  . Numbness in left leg    s/p spinal surgery  . Supraventricular tachycardia (HCC)    ablation 1998  . Vitamin D deficiency     Past Surgical History:  Procedure Laterality Date  . AV NODE ABLATION  1998   for svt  . BACK SURGERY  2001   lumbar fusion  . PARTIAL HYSTERECTOMY  1993  . REPAIR EXTENSOR TENDON Left 03/06/2013   Procedure: LEFT LONG BOUTONNIERE REPAIR;  Surgeon: Wyn Forster., MD;  Location:  SURGERY CENTER;  Service: Orthopedics;  Laterality: Left;  . SPINE SURGERY    . stimulation system implant  07/31/00   lumbar nerve stimulator-none funtioning now    There were no vitals filed for this visit.  Subjective Assessment - 01/27/18 0734    Subjective  Patient arrived with some soreness today    Pertinent History  left RTC Repair 12/26/17; chronic pain, depression, internal stimulator but "dead"    Limitations  House hold activities;Lifting    Diagnostic tests  X-Ray normal    Patient Stated Goals  less pain, use my arm better    Currently in Pain?  Yes    Pain Score  6     Pain  Location  Shoulder    Pain Orientation  Left    Pain Descriptors / Indicators  Discomfort    Pain Type  Surgical pain    Pain Onset  1 to 4 weeks ago    Pain Frequency  Constant    Aggravating Factors   movement    Pain Relieving Factors  rest         [facility] PT Assessment - 01/27/18 0001      PROM   Left Shoulder Flexion  85 Degrees    Left Shoulder External Rotation  48 Degrees                   [facility] Adult PT Treatment/Exercise - 01/27/18 0001      Vasopneumatic   Number Minutes Vasopneumatic   15 minutes    Vasopnuematic Location   Shoulder    Vasopneumatic Pressure  Low      Manual Therapy   Manual Therapy  Passive ROM    Passive ROM  PROM to left shoulder in ER and flexion , ossilations to help relax muscles               PT Short Term Goals -  01/14/18 0921      PT SHORT TERM GOAL #1   Title  Patient will be independent with initial HEP    Time  3    Period  Weeks    Status  New      PT SHORT TERM GOAL #2   Title  Patient will demonstrate 90+ degrees of left shoulder flexion PROM.    Time  3    Period  Weeks    Status  New      PT SHORT TERM GOAL #3   Title  Patient will demonstrate 20+ degrees of left shoulder ER PROM    Time  3    Period  Weeks    Status  New        PT Long Term Goals - 01/14/18 [id number]      PT LONG TERM GOAL #1   Title  Patient will be independent with advanced HEP    Time  6    Period  Weeks    Status  New      PT LONG TERM GOAL #2   Title  Patient will demonstrate 120+ degrees of left shoulder flexion AROM to improve ability to perform functional tasks.    Time  6    Period  Weeks    Status  New      PT LONG TERM GOAL #3   Title  Patient will demonstrate 60+ degrees of left shoulder external rotation AROM to improve ability to don/doff apparel    Time  6    Period  Weeks    Status  New      PT LONG TERM GOAL #4   Title  Patient will demonstrate 4/5 or greater left shoulder MMT in all planes to  improve stability during functional tasks.    Time  6    Period  Weeks    Status  New      PT LONG TERM GOAL #5   Title  Patient will report ability to perform ADLs with left shoulder pain less than or equal to 4/10.    Time  6    Period  Weeks    Status  New            Plan - 01/27/18 0802    Clinical Impression Statement  Patient tolerated treatment fair due to soreness in shoulder. Patient has improved with ER today yet no improvement with Flexion. Patient required ossilations to relax as she was very guarded. Patient going to [place] jan [place]th for F/U. Goals ongoing.    Rehab Potential  Fair    Clinical Impairments Affecting Rehab Potential  chronic pain    PT Frequency  3x / week    PT Duration  6 weeks    PT Treatment/Interventions  Cryotherapy;ADLs/Self Care Home Management;Moist Heat;Therapeutic exercise;Therapeutic activities;Balance training;Neuromuscular re-education;Patient/family education;Manual techniques;Passive range of motion;Ultrasound;Electrical Stimulation    PT Next Visit Plan  PROM to left shoulder; modalties PRN for pain relief.    Consulted and Agree with Plan of Care  Patient       Patient will benefit from skilled therapeutic intervention in order to improve the following deficits and impairments:  Pain, Impaired UE functional use, Decreased activity tolerance, Decreased range of motion, Decreased strength, Postural dysfunction  Visit Diagnosis: Chronic left shoulder pain  Stiffness of left shoulder, not elsewhere classified  Muscle weakness (generalized)     Problem List Patient Active Problem List   Diagnosis Date Noted  . Chronic left  shoulder pain 09/18/2017  . Cervicalgia 09/18/2017  . Obesity (BMI 30.0-34.9) 05/20/2017  . Peripheral edema 09/07/2015  . Vitamin D deficiency 06/22/2014  . Depression 06/21/2014  . GAD (generalized anxiety disorder) 06/21/2014  . Back pain 06/21/2014  . Osteopenia 02/11/2013  . Constipation 05/08/2010     Hermelinda DellenDUNFORD, Kavion Mancinas P, PTA 01/27/2018, 8:17 AM  [facility] [address] [place], [place], [address] Phone: [phone]   Fax:  [phone]  Name: Molly Castaneda MRN: [id number] Date of Birth: September 02, 1960

## 2018-01-28 ENCOUNTER — Ambulatory Visit: Payer: Medicare HMO | Admitting: Physical Therapy

## 2018-01-28 DIAGNOSIS — M25512 Pain in left shoulder: Principal | ICD-10-CM

## 2018-01-28 DIAGNOSIS — M25612 Stiffness of left shoulder, not elsewhere classified: Secondary | ICD-10-CM

## 2018-01-28 DIAGNOSIS — M6281 Muscle weakness (generalized): Secondary | ICD-10-CM | POA: Diagnosis not present

## 2018-01-28 DIAGNOSIS — G8929 Other chronic pain: Secondary | ICD-10-CM

## 2018-01-28 NOTE — Therapy (Addendum)
[facility] [address] [place], [place], [address] Phone: [phone]   Fax:  [phone]  Physical Therapy Treatment  Patient Details  Name: Molly Castaneda MRN: [id number] Date of Birth: 1961/01/19 Referring Provider (PT): Norlene CampbellPeter Whitfield, MD   Encounter Date: 01/28/2018  PT End of Session - 01/28/18 0809    Visit Number  7    Number of Visits  18    Date for PT Re-Evaluation  03/04/18    Authorization Type  Progress note every 10th visit.    PT Start Time  0730    PT Stop Time  0820    PT Time Calculation (min)  50 min    Equipment Utilized During Treatment  Other (comment)   sling   Activity Tolerance  Patient tolerated treatment well    Behavior During Therapy  WFL for tasks assessed/performed       Past Medical History:  Diagnosis Date  . Arthritis   . Chronic back pain   . Chronic constipation   . Depression   . MVP (mitral valve prolapse)    had ablation 1998  . Numbness in left leg    s/p spinal surgery  . Supraventricular tachycardia (HCC)    ablation 1998  . Vitamin D deficiency     Past Surgical History:  Procedure Laterality Date  . AV NODE ABLATION  1998   for svt  . BACK SURGERY  2001   lumbar fusion  . PARTIAL HYSTERECTOMY  1993  . REPAIR EXTENSOR TENDON Left 03/06/2013   Procedure: LEFT LONG BOUTONNIERE REPAIR;  Surgeon: Wyn Forsterobert V Sypher Jr., MD;  Location: [facility];  Service: Orthopedics;  Laterality: Left;  . SPINE SURGERY    . stimulation system implant  07/31/00   lumbar nerve stimulator-none funtioning now    There were no vitals filed for this visit.  Subjective Assessment - 01/28/18 0809    Subjective  Patient reports going into town yesterday caused increased soreness in left shoulder today.    Patient is accompained by:  Family member    Pertinent History  left RTC Repair 12/26/17; chronic pain, depression, internal stimulator but "dead"    Limitations  House hold  activities;Lifting    Diagnostic tests  X-Ray normal    Patient Stated Goals  less pain, use my arm better    Currently in Pain?  Yes    Pain Score  7     Pain Location  Shoulder    Pain Orientation  Left    Pain Type  Surgical pain    Pain Onset  1 to 4 weeks ago    Pain Frequency  Constant             See flowsheet for treatment  Guss BundeKrystle Merari Pion, PT, DPT 02/07/2018                    PT Short Term Goals - 01/14/18 0921      PT SHORT TERM GOAL #1   Title  Patient will be independent with initial HEP    Time  3    Period  Weeks    Status  New      PT SHORT TERM GOAL #2   Title  Patient will demonstrate 90+ degrees of left shoulder flexion PROM.    Time  3    Period  Weeks    Status  New      PT SHORT TERM GOAL #3  Title  Patient will demonstrate 20+ degrees of left shoulder ER PROM    Time  3    Period  Weeks    Status  New        PT Long Term Goals - 01/14/18 [id number]      PT LONG TERM GOAL #1   Title  Patient will be independent with advanced HEP    Time  6    Period  Weeks    Status  New      PT LONG TERM GOAL #2   Title  Patient will demonstrate 120+ degrees of left shoulder flexion AROM to improve ability to perform functional tasks.    Time  6    Period  Weeks    Status  New      PT LONG TERM GOAL #3   Title  Patient will demonstrate 60+ degrees of left shoulder external rotation AROM to improve ability to don/doff apparel    Time  6    Period  Weeks    Status  New      PT LONG TERM GOAL #4   Title  Patient will demonstrate 4/5 or greater left shoulder MMT in all planes to improve stability during functional tasks.    Time  6    Period  Weeks    Status  New      PT LONG TERM GOAL #5   Title  Patient will report ability to perform ADLs with left shoulder pain less than or equal to 4/10.    Time  6    Period  Weeks    Status  New            Plan - 01/28/18 0810    Clinical Impression Statement  Patient was able  to tolerate treatment fairly well despite left shoulder pain and soreness. Patient educated on protocol for PT and  importance of following guidelines to promote optimal healing. Normal response to modalities upon removal. Patient demonstrates independence with donning/doffing sling    Clinical Presentation  Stable    Clinical Decision Making  Moderate    Rehab Potential  Fair    Clinical Impairments Affecting Rehab Potential  chronic pain    PT Frequency  3x / week    PT Treatment/Interventions  Cryotherapy;ADLs/Self Care Home Management;Moist Heat;Therapeutic exercise;Therapeutic activities;Balance training;Neuromuscular re-education;Patient/family education;Manual techniques;Passive range of motion;Ultrasound;Electrical Stimulation    PT Next Visit Plan  PROM to left shoulder; modalties PRN for pain relief.    Consulted and Agree with Plan of Care  Patient       Patient will benefit from skilled therapeutic intervention in order to improve the following deficits and impairments:  Pain, Impaired UE functional use, Decreased activity tolerance, Decreased range of motion, Decreased strength, Postural dysfunction  Visit Diagnosis: Chronic left shoulder pain  Stiffness of left shoulder, not elsewhere classified  Muscle weakness (generalized)     Problem List Patient Active Problem List   Diagnosis Date Noted  . Chronic left shoulder pain 09/18/2017  . Cervicalgia 09/18/2017  . Obesity (BMI 30.0-34.9) 05/20/2017  . Peripheral edema 09/07/2015  . Vitamin D deficiency 06/22/2014  . Depression 06/21/2014  . GAD (generalized anxiety disorder) 06/21/2014  . Back pain 06/21/2014  . Osteopenia 02/11/2013  . Constipation 05/08/2010   Guss BundeKrystle Kynsie Falkner, PT, DPT 01/28/2018, 8:20 AM  [facility] [address] [place], [place], [address] Phone: [phone]   Fax:  [phone]  Name: Molly Castaneda MRN: [id number] Date of  Birth:  05-04-1960

## 2018-02-03 ENCOUNTER — Ambulatory Visit (INDEPENDENT_AMBULATORY_CARE_PROVIDER_SITE_OTHER): Payer: Medicare HMO | Admitting: Orthopaedic Surgery

## 2018-02-03 ENCOUNTER — Encounter (INDEPENDENT_AMBULATORY_CARE_PROVIDER_SITE_OTHER): Payer: Self-pay | Admitting: Orthopaedic Surgery

## 2018-02-03 VITALS — BP 107/85 | HR 97 | Ht 72.0 in | Wt 222.0 lb

## 2018-02-03 DIAGNOSIS — G8929 Other chronic pain: Secondary | ICD-10-CM

## 2018-02-03 DIAGNOSIS — M25512 Pain in left shoulder: Secondary | ICD-10-CM

## 2018-02-03 NOTE — Progress Notes (Signed)
2 weeks follow up for the Rt shoulder 12/26/17 surgery... Pt stated still having sore to touch and PT 2x a week.

## 2018-02-03 NOTE — Progress Notes (Signed)
Office Visit Note   Patient: Molly Castaneda           Date of Birth: 08-Apr-1960           MRN: [id number] Visit Date: 02/03/2018              Requested by: Junie SpencerHawks, Christy A, FNP [address] [place], [place] [address] PCP: Junie SpencerHawks, Christy A, FNP   Assessment & Plan: Visit Diagnoses:  1. Chronic left shoulder pain     Plan: Nearly 6 weeks status post rotator cuff tear repair of left shoulder and doing well.  Will wean from the sling.  Continue with physical therapy.  Has early adhesive capsulitis.  Long discussion regarding need for range of motion exercises.  Office 1 month  Follow-Up Instructions: Return in about 1 month (around 03/06/2018).   Orders:  No orders of the defined types were placed in this encounter.  No orders of the defined types were placed in this encounter.     Procedures: No procedures performed   Clinical Data: No additional findings.   Subjective: No chief complaint on file. Mrs. Molly SnareLowe is nearly 6 weeks status post mini open rotator cuff tear repair of her left shoulder with an arthroscopic SCD and DCR.  Not having any pain except with physical therapy and passive motion.  No numbness or tingling.  No shortness of breath or chest pain  HPI  Review of Systems   Objective: Vital Signs: BP 107/85   Pulse 97   Ht 6' (1.829 m)   Wt 222 lb (100.7 kg)   BMI 30.11 kg/m   Physical Exam  Ortho Exam left shoulder wounds are healing without problem.  Good grip and good release.  I can abduct passively about 90 degrees at which point she was uncomfortable.  I can flex about 110 to 20 degrees.  Specialty Comments:  No specialty comments available.  Imaging: No results found.   PMFS History: Patient Active Problem List   Diagnosis Date Noted  . Chronic left shoulder pain 09/18/2017  . Cervicalgia 09/18/2017  . Obesity (BMI 30.0-34.9) 05/20/2017  . Peripheral edema 09/07/2015  . Vitamin D deficiency 06/22/2014  . Depression 06/21/2014  .  GAD (generalized anxiety disorder) 06/21/2014  . Back pain 06/21/2014  . Osteopenia 02/11/2013  . Constipation 05/08/2010   Past Medical History:  Diagnosis Date  . Arthritis   . Chronic back pain   . Chronic constipation   . Depression   . MVP (mitral valve prolapse)    had ablation 1998  . Numbness in left leg    s/p spinal surgery  . Supraventricular tachycardia (HCC)    ablation 1998  . Vitamin D deficiency     Family History  Problem Relation Age of Onset  . Coronary artery disease Father   . Colon polyps Father   . Prostate cancer Maternal Grandfather   . Hodgkin's lymphoma Son   . Liver cancer Paternal Uncle        ?  Marland Kitchen. Arthritis Mother   . Colon cancer Neg Hx     Past Surgical History:  Procedure Laterality Date  . AV NODE ABLATION  1998   for svt  . BACK SURGERY  2001   lumbar fusion  . PARTIAL HYSTERECTOMY  1993  . REPAIR EXTENSOR TENDON Left 03/06/2013   Procedure: LEFT LONG BOUTONNIERE REPAIR;  Surgeon: Wyn Forsterobert V Sypher Jr., MD;  Location: [facility];  Service: Orthopedics;  Laterality: Left;  .  SPINE SURGERY    . stimulation system implant  07/31/00   lumbar nerve stimulator-none funtioning now   Social History   Occupational History  . Occupation: disabled    Associate Professormployer: UNEMPLOYED  Tobacco Use  . Smoking status: Never Smoker  . Smokeless tobacco: Never Used  Substance and Sexual Activity  . Alcohol use: No  . Drug use: No  . Sexual activity: Never     Valeria BatmanPeter W Burnice Oestreicher, MD   Note - This record has been created using [employer] software.  Chart creation errors have been sought, but may not always  have been located. Such creation errors do not reflect on  the standard of medical care.

## 2018-02-04 ENCOUNTER — Ambulatory Visit: Payer: Medicare HMO

## 2018-02-06 ENCOUNTER — Ambulatory Visit: Payer: Medicare HMO | Admitting: Physical Therapy

## 2018-02-07 ENCOUNTER — Ambulatory Visit: Payer: Medicare HMO | Attending: Orthopaedic Surgery | Admitting: Physical Therapy

## 2018-02-07 ENCOUNTER — Encounter: Payer: Self-pay | Admitting: Physical Therapy

## 2018-02-07 DIAGNOSIS — M6281 Muscle weakness (generalized): Secondary | ICD-10-CM | POA: Diagnosis not present

## 2018-02-07 DIAGNOSIS — M25512 Pain in left shoulder: Secondary | ICD-10-CM | POA: Insufficient documentation

## 2018-02-07 DIAGNOSIS — M25612 Stiffness of left shoulder, not elsewhere classified: Secondary | ICD-10-CM

## 2018-02-07 DIAGNOSIS — G8929 Other chronic pain: Secondary | ICD-10-CM | POA: Diagnosis not present

## 2018-02-07 NOTE — Therapy (Signed)
[facility] [address] [place], [place], [address] Phone: [phone]   Fax:  [phone]  Physical Therapy Treatment  Patient Details  Name: Molly Castaneda MRN: [id number] Date of Birth: [birth year] Referring Provider (PT): Norlene Campbell, MD   Encounter Date: 02/07/2018  PT End of Session - 02/07/18 0937    Visit Number  8    Number of Visits  18    Date for PT Re-Evaluation  03/04/18    Authorization Type  Progress note every 10th visit.    PT Start Time  0900    PT Stop Time  0950    PT Time Calculation (min)  50 min    Activity Tolerance  Patient tolerated treatment well    Behavior During Therapy  WFL for tasks assessed/performed       Past Medical History:  Diagnosis Date  . Arthritis   . Chronic back pain   . Chronic constipation   . Depression   . MVP (mitral valve prolapse)    had ablation 1998  . Numbness in left leg    s/p spinal surgery  . Supraventricular tachycardia (HCC)    ablation 1998  . Vitamin D deficiency     Past Surgical History:  Procedure Laterality Date  . AV NODE ABLATION  1998   for svt  . BACK SURGERY  2001   lumbar fusion  . PARTIAL HYSTERECTOMY  1993  . REPAIR EXTENSOR TENDON Left 03/06/2013   Procedure: LEFT LONG BOUTONNIERE REPAIR;  Surgeon: Wyn Forster., MD;  Location: [facility];  Service: Orthopedics;  Laterality: Left;  . SPINE SURGERY    . stimulation system implant  07/31/00   lumbar nerve stimulator-none funtioning now    There were no vitals filed for this visit.  Subjective Assessment - 02/07/18 0904    Subjective  Feeling sore since she did a lot of walking. Follow up visit went well and is expected to wean out of sling by 02/24/2018.     Patient is accompained by:  --    Pertinent History  left RTC Repair 12/26/17; chronic pain, depression, internal stimulator but "dead"    Limitations  House hold activities;Lifting    Diagnostic tests  X-Ray  normal    Patient Stated Goals  less pain, use my arm better    Currently in Pain?  Yes    Pain Score  6     Pain Location  Shoulder    Pain Descriptors / Indicators  Aching;Sore    Pain Type  Surgical pain    Pain Onset  1 to 4 weeks ago    Pain Frequency  Constant         [facility] PT Assessment - 02/07/18 0001      Assessment   Medical Diagnosis  Chronic left shoulder pain, S/P left RTC repair    Referring Provider (PT)  Norlene Campbell, MD    Onset Date/Surgical Date  12/26/17    Hand Dominance  Right    Next MD Visit  02/24/2018    Prior Therapy  yes                   [facility] Adult PT Treatment/Exercise - 02/07/18 0001      Vasopneumatic   Number Minutes Vasopneumatic   15 minutes    Vasopnuematic Location   Shoulder    Vasopneumatic Pressure  Low    Vasopneumatic Temperature   34  Manual Therapy   Manual Therapy  Passive ROM    Passive ROM  PROM to left shoulder in ER and flexion , ossilations to help relax muscles               PT Short Term Goals - 01/14/18 9562      PT SHORT TERM GOAL #1   Title  Patient will be independent with initial HEP    Time  3    Period  Weeks    Status  New      PT SHORT TERM GOAL #2   Title  Patient will demonstrate 90+ degrees of left shoulder flexion PROM.    Time  3    Period  Weeks    Status  New      PT SHORT TERM GOAL #3   Title  Patient will demonstrate 20+ degrees of left shoulder ER PROM    Time  3    Period  Weeks    Status  New        PT Long Term Goals - 01/14/18 1308      PT LONG TERM GOAL #1   Title  Patient will be independent with advanced HEP    Time  6    Period  Weeks    Status  New      PT LONG TERM GOAL #2   Title  Patient will demonstrate 120+ degrees of left shoulder flexion AROM to improve ability to perform functional tasks.    Time  6    Period  Weeks    Status  New      PT LONG TERM GOAL #3   Title  Patient will demonstrate 60+ degrees of left shoulder external  rotation AROM to improve ability to don/doff apparel    Time  6    Period  Weeks    Status  New      PT LONG TERM GOAL #4   Title  Patient will demonstrate 4/5 or greater left shoulder MMT in all planes to improve stability during functional tasks.    Time  6    Period  Weeks    Status  New      PT LONG TERM GOAL #5   Title  Patient will report ability to perform ADLs with left shoulder pain less than or equal to 4/10.    Time  6    Period  Weeks    Status  New            Plan - 02/07/18 6578    Clinical Impression Statement  Patient was able to tolerate PROM well with minimal increases of pain. Patient noted with smooth arc of motion during PROM; normal response to modalities upon removal.     Clinical Presentation  Stable    Clinical Decision Making  Moderate    Rehab Potential  Fair    Clinical Impairments Affecting Rehab Potential  chronic pain    PT Frequency  3x / week    PT Duration  6 weeks    PT Treatment/Interventions  Cryotherapy;ADLs/Self Care Home Management;Moist Heat;Therapeutic exercise;Therapeutic activities;Balance training;Neuromuscular re-education;Patient/family education;Manual techniques;Passive range of motion;Ultrasound;Electrical Stimulation    PT Next Visit Plan  PROM to left shoulder; modalties PRN for pain relief.    Consulted and Agree with Plan of Care  Patient       Patient will benefit from skilled therapeutic intervention in order to improve the following deficits and impairments:  Pain, Impaired UE  functional use, Decreased activity tolerance, Decreased range of motion, Decreased strength, Postural dysfunction  Visit Diagnosis: Chronic left shoulder pain  Stiffness of left shoulder, not elsewhere classified  Muscle weakness (generalized)     Problem List Patient Active Problem List   Diagnosis Date Noted  . Chronic left shoulder pain 09/18/2017  . Cervicalgia 09/18/2017  . Obesity (BMI 30.0-34.9) 05/20/2017  . Peripheral edema  09/07/2015  . Vitamin D deficiency 06/22/2014  . Depression 06/21/2014  . GAD (generalized anxiety disorder) 06/21/2014  . Back pain 06/21/2014  . Osteopenia 02/11/2013  . Constipation 05/08/2010    Guss BundeKrystle Makayleigh Poliquin, PT, DPT 02/07/2018, 10:30 AM  [facility] [address] [place], [place], [address] Phone: [phone]   Fax:  [phone]  Name: Molly Castaneda MRN: [id number] Date of Birth: 1960-12-20

## 2018-02-11 ENCOUNTER — Other Ambulatory Visit: Payer: Self-pay | Admitting: Family

## 2018-02-11 ENCOUNTER — Ambulatory Visit: Payer: Medicare HMO | Admitting: *Deleted

## 2018-02-11 DIAGNOSIS — R609 Edema, unspecified: Secondary | ICD-10-CM

## 2018-02-12 ENCOUNTER — Ambulatory Visit: Payer: Medicare HMO | Admitting: Physical Therapy

## 2018-02-12 DIAGNOSIS — M6281 Muscle weakness (generalized): Secondary | ICD-10-CM | POA: Diagnosis not present

## 2018-02-12 DIAGNOSIS — M25612 Stiffness of left shoulder, not elsewhere classified: Secondary | ICD-10-CM | POA: Diagnosis not present

## 2018-02-12 DIAGNOSIS — M25512 Pain in left shoulder: Secondary | ICD-10-CM | POA: Diagnosis not present

## 2018-02-12 DIAGNOSIS — G8929 Other chronic pain: Secondary | ICD-10-CM

## 2018-02-12 NOTE — Therapy (Signed)
[facility] [address] [place], [place], [address] Phone: [phone]   Fax:  [phone]  Physical Therapy Treatment  Patient Details  Name: Molly Castaneda MRN: [id number] Date of Birth: 09-24-1960 Referring Provider (PT): Norlene Campbell, MD   Encounter Date: 02/12/2018  PT End of Session - 02/12/18 0904    Visit Number  9    Number of Visits  18    Date for PT Re-Evaluation  03/04/18    Authorization Type  Progress note every 10th visit.    PT Start Time  0904    PT Stop Time  0950    PT Time Calculation (min)  46 min    Activity Tolerance  Patient tolerated treatment well    Behavior During Therapy  [facility] for tasks assessed/performed       Past Medical History:  Diagnosis Date  . Arthritis   . Chronic back pain   . Chronic constipation   . Depression   . MVP (mitral valve prolapse)    had ablation 1998  . Numbness in left leg    s/p spinal surgery  . Supraventricular tachycardia (HCC)    ablation 1998  . Vitamin D deficiency     Past Surgical History:  Procedure Laterality Date  . AV NODE ABLATION  1998   for svt  . BACK SURGERY  2001   lumbar fusion  . PARTIAL HYSTERECTOMY  1993  . REPAIR EXTENSOR TENDON Left 03/06/2013   Procedure: LEFT LONG BOUTONNIERE REPAIR;  Surgeon: Wyn Forster., MD;  Location: [facility];  Service: Orthopedics;  Laterality: Left;  . SPINE SURGERY    . stimulation system implant  07/31/00   lumbar nerve stimulator-none funtioning now    There were no vitals filed for this visit.  Subjective Assessment - 02/12/18 0903    Subjective  Reports her shoulder feeling "okay."    Pertinent History  left RTC Repair 12/26/17; chronic pain, depression, internal stimulator but "dead"    Limitations  House hold activities;Lifting    Diagnostic tests  X-Ray normal    Patient Stated Goals  less pain, use my arm better    Currently in Pain?  Yes    Pain Score  5     Pain Location   Shoulder    Pain Orientation  Left    Pain Descriptors / Indicators  Sore;Aching    Pain Type  Surgical pain    Pain Onset  1 to 4 weeks ago    Pain Frequency  Constant         [facility] PT Assessment - 02/12/18 0001      Assessment   Medical Diagnosis  Chronic left shoulder pain, S/P left RTC repair    Referring Provider (PT)  Norlene Campbell, MD    Onset Date/Surgical Date  12/26/17    Hand Dominance  Right    Next MD Visit  02/24/2018    Prior Therapy  yes      Precautions   Precautions  Shoulder    Type of Shoulder Precautions  rotator cuff repair    Shoulder Interventions  Shoulder sling/immobilizer                   [facility] Adult PT Treatment/Exercise - 02/12/18 0001      Modalities   Modalities  Electrical Stimulation;Vasopneumatic      Electrical Stimulation   Electrical Stimulation Location  L shoulder     Electrical Stimulation Action  Pre-Mod    Electrical Stimulation Parameters  80-150 hz x15 min    Electrical Stimulation Goals  Pain      Vasopneumatic   Number Minutes Vasopneumatic   15 minutes    Vasopnuematic Location   Shoulder    Vasopneumatic Pressure  Low    Vasopneumatic Temperature   56      Manual Therapy   Manual Therapy  Passive ROM    Passive ROM  PROM of L shoulder into flexion, ER, IR with gentle holds at end range at intermittant oscillations                PT Short Term Goals - 01/14/18 0981      PT SHORT TERM GOAL #1   Title  Patient will be independent with initial HEP    Time  3    Period  Weeks    Status  New      PT SHORT TERM GOAL #2   Title  Patient will demonstrate 90+ degrees of left shoulder flexion PROM.    Time  3    Period  Weeks    Status  New      PT SHORT TERM GOAL #3   Title  Patient will demonstrate 20+ degrees of left shoulder ER PROM    Time  3    Period  Weeks    Status  New        PT Long Term Goals - 01/14/18 1914      PT LONG TERM GOAL #1   Title  Patient will be independent  with advanced HEP    Time  6    Period  Weeks    Status  New      PT LONG TERM GOAL #2   Title  Patient will demonstrate 120+ degrees of left shoulder flexion AROM to improve ability to perform functional tasks.    Time  6    Period  Weeks    Status  New      PT LONG TERM GOAL #3   Title  Patient will demonstrate 60+ degrees of left shoulder external rotation AROM to improve ability to don/doff apparel    Time  6    Period  Weeks    Status  New      PT LONG TERM GOAL #4   Title  Patient will demonstrate 4/5 or greater left shoulder MMT in all planes to improve stability during functional tasks.    Time  6    Period  Weeks    Status  New      PT LONG TERM GOAL #5   Title  Patient will report ability to perform ADLs with left shoulder pain less than or equal to 4/10.    Time  6    Period  Weeks    Status  New            Plan - 02/12/18 7829    Clinical Impression Statement  Patient arrived today with reports of increased aching and soreness in L shoulder. Patient demonstrated intermittant facial grimacing due to discomfort. Frequent and intermittant oscillations provided throughout PROM session. Firm end feels and smooth arc of motions noted with PROM of L shoulder. Normal modalities response noted following removal of the modalities.    Rehab Potential  Fair    Clinical Impairments Affecting Rehab Potential  chronic pain    PT Frequency  3x / week    PT Duration  6 weeks  PT Treatment/Interventions  Cryotherapy;ADLs/Self Care Home Management;Moist Heat;Therapeutic exercise;Therapeutic activities;Balance training;Neuromuscular re-education;Patient/family education;Manual techniques;Passive range of motion;Ultrasound;Electrical Stimulation    PT Next Visit Plan  PROM to left shoulder; modalties PRN for pain relief.    Consulted and Agree with Plan of Care  Patient       Patient will benefit from skilled therapeutic intervention in order to improve the following deficits  and impairments:  Pain, Impaired UE functional use, Decreased activity tolerance, Decreased range of motion, Decreased strength, Postural dysfunction  Visit Diagnosis: Chronic left shoulder pain  Stiffness of left shoulder, not elsewhere classified  Muscle weakness (generalized)     Problem List Patient Active Problem List   Diagnosis Date Noted  . Chronic left shoulder pain 09/18/2017  . Cervicalgia 09/18/2017  . Obesity (BMI 30.0-34.9) 05/20/2017  . Peripheral edema 09/07/2015  . Vitamin D deficiency 06/22/2014  . Depression 06/21/2014  . GAD (generalized anxiety disorder) 06/21/2014  . Back pain 06/21/2014  . Osteopenia 02/11/2013  . Constipation 05/08/2010    Marvell FullerKelsey P Gloris Shiroma, PTA 02/12/2018, 9:51 AM  [facility] [address] [place], [place], [address] Phone: [phone]   Fax:  [phone]  Name: Molly Castaneda MRN: [id number] Date of Birth: January 25, 1961

## 2018-02-13 ENCOUNTER — Ambulatory Visit: Payer: Medicare HMO | Admitting: *Deleted

## 2018-02-13 DIAGNOSIS — G8929 Other chronic pain: Secondary | ICD-10-CM

## 2018-02-13 DIAGNOSIS — M6281 Muscle weakness (generalized): Secondary | ICD-10-CM

## 2018-02-13 DIAGNOSIS — M25612 Stiffness of left shoulder, not elsewhere classified: Secondary | ICD-10-CM | POA: Diagnosis not present

## 2018-02-13 DIAGNOSIS — M25512 Pain in left shoulder: Principal | ICD-10-CM

## 2018-02-13 NOTE — Therapy (Signed)
[facility] [address] [place], [place], [address] Phone: [phone]   Fax:  [phone]  Physical Therapy Treatment  Progress Note Reporting Period 01/14/2018 to 02/13/2018  See note below for Objective Data and Assessment of Progress/Goals.      Patient Details  Name: Molly Castaneda MRN: [id number] Date of Birth: 1960-03-29 Referring Provider (PT): Norlene Campbell, MD   Encounter Date: 02/13/2018  PT End of Session - 02/13/18 0923    Visit Number  10    Number of Visits  18    Date for PT Re-Evaluation  03/04/18    Authorization Type  Progress note every 10th visit.    PT Start Time  0900    PT Stop Time  1000    PT Time Calculation (min)  60 min       Past Medical History:  Diagnosis Date  . Arthritis   . Chronic back pain   . Chronic constipation   . Depression   . MVP (mitral valve prolapse)    had ablation 1998  . Numbness in left leg    s/p spinal surgery  . Supraventricular tachycardia (HCC)    ablation 1998  . Vitamin D deficiency     Past Surgical History:  Procedure Laterality Date  . AV NODE ABLATION  1998   for svt  . BACK SURGERY  2001   lumbar fusion  . PARTIAL HYSTERECTOMY  1993  . REPAIR EXTENSOR TENDON Left 03/06/2013   Procedure: LEFT LONG BOUTONNIERE REPAIR;  Surgeon: Wyn Forster., MD;  Location: [facility];  Service: Orthopedics;  Laterality: Left;  . SPINE SURGERY    . stimulation system implant  07/31/00   lumbar nerve stimulator-none funtioning now    There were no vitals filed for this visit.  Subjective Assessment - 02/13/18 0922    Subjective  Reports her shoulder feeling "okay." Sore    Patient is accompained by:  Family member    Pertinent History  left RTC Repair 12/26/17; chronic pain, depression, internal stimulator but "dead"    Limitations  House hold activities;Lifting    Diagnostic tests  X-Ray normal    Patient Stated Goals  less pain, use my arm  better    Currently in Pain?  Yes    Pain Score  6     Pain Location  Shoulder    Pain Orientation  Left    Pain Descriptors / Indicators  Sore;Aching    Pain Type  Surgical pain    Pain Onset  1 to 4 weeks ago                       [facility] Adult PT Treatment/Exercise - 02/13/18 0001      Shoulder Exercises: Pulleys   Flexion  5 minutes    Other Pulley Exercises  sitting UE ranger x 5 mins flexion/extension, ciircles CW/CCW      Modalities   Modalities  Electrical Stimulation;Vasopneumatic      Electrical Stimulation   Electrical Stimulation Location  L shoulder IFC x 80-150hz  x 15 mins    Electrical Stimulation Goals  Pain      Vasopneumatic   Number Minutes Vasopneumatic   15 minutes    Vasopnuematic Location   Shoulder    Vasopneumatic Pressure  Low    Vasopneumatic Temperature   36      Manual Therapy   Manual Therapy  Passive ROM    Passive  ROM  PROM of L shoulder into flexion, ER, IR with gentle holds at end range at intermittant oscillations                PT Short Term Goals - 01/14/18 0921      PT SHORT TERM GOAL #1   Title  Patient will be independent with initial HEP    Time  3    Period  Weeks    Status  New      PT SHORT TERM GOAL #2   Title  Patient will demonstrate 90+ degrees of left shoulder flexion PROM.    Time  3    Period  Weeks    Status  New      PT SHORT TERM GOAL #3   Title  Patient will demonstrate 20+ degrees of left shoulder ER PROM    Time  3    Period  Weeks    Status  New        PT Long Term Goals - 01/14/18 6834      PT LONG TERM GOAL #1   Title  Patient will be independent with advanced HEP    Time  6    Period  Weeks    Status  New      PT LONG TERM GOAL #2   Title  Patient will demonstrate 120+ degrees of left shoulder flexion AROM to improve ability to perform functional tasks.    Time  6    Period  Weeks    Status  New      PT LONG TERM GOAL #3   Title  Patient will demonstrate 60+  degrees of left shoulder external rotation AROM to improve ability to don/doff apparel    Time  6    Period  Weeks    Status  New      PT LONG TERM GOAL #4   Title  Patient will demonstrate 4/5 or greater left shoulder MMT in all planes to improve stability during functional tasks.    Time  6    Period  Weeks    Status  New      PT LONG TERM GOAL #5   Title  Patient will report ability to perform ADLs with left shoulder pain less than or equal to 4/10.    Time  6    Period  Weeks    Status  New            Plan - 02/13/18 1003    Clinical Impression Statement  Pt arrived today 7 weeks post-op LT shldr with soreness still 5/10. She did great with AAROM exs today  and tolerated PROM with some muscle guarding that decreased with oscillations . ER to 55 degrees today. Normal modality response.    Clinical Presentation  Stable    Rehab Potential  Fair    Clinical Impairments Affecting Rehab Potential  chronic pain    PT Frequency  3x / week    PT Duration  6 weeks    PT Treatment/Interventions  Cryotherapy;ADLs/Self Care Home Management;Moist Heat;Therapeutic exercise;Therapeutic activities;Balance training;Neuromuscular re-education;Patient/family education;Manual techniques;Passive range of motion;Ultrasound;Electrical Stimulation    PT Next Visit Plan  PROM to left shoulder; modalties PRN for pain relief.  AAROM started with pulleys and seated UE ranger    Consulted and Agree with Plan of Care  Patient       Patient will benefit from skilled therapeutic intervention in order to improve the following deficits and impairments:  Pain, Impaired UE functional use, Decreased activity tolerance, Decreased range of motion, Decreased strength, Postural dysfunction  Visit Diagnosis: Chronic left shoulder pain  Stiffness of left shoulder, not elsewhere classified  Muscle weakness (generalized)     Problem List Patient Active Problem List   Diagnosis Date Noted  . Chronic left  shoulder pain 09/18/2017  . Cervicalgia 09/18/2017  . Obesity (BMI 30.0-34.9) 05/20/2017  . Peripheral edema 09/07/2015  . Vitamin D deficiency 06/22/2014  . Depression 06/21/2014  . GAD (generalized anxiety disorder) 06/21/2014  . Back pain 06/21/2014  . Osteopenia 02/11/2013  . Constipation 05/08/2010    Madelene Kaatz,CHRIS, PTA 02/13/2018, 10:13 AM  [facility] [address] [place], [place], [address] Phone: [phone]   Fax:  [phone]  Name: [name] MRN: [id number] Date of Birth: March 11, 1960

## 2018-02-18 ENCOUNTER — Ambulatory Visit: Payer: Medicare HMO | Admitting: Physical Therapy

## 2018-02-18 ENCOUNTER — Encounter: Payer: Self-pay | Admitting: Physical Therapy

## 2018-02-18 DIAGNOSIS — G8929 Other chronic pain: Secondary | ICD-10-CM | POA: Diagnosis not present

## 2018-02-18 DIAGNOSIS — M25612 Stiffness of left shoulder, not elsewhere classified: Secondary | ICD-10-CM | POA: Diagnosis not present

## 2018-02-18 DIAGNOSIS — M6281 Muscle weakness (generalized): Secondary | ICD-10-CM | POA: Diagnosis not present

## 2018-02-18 DIAGNOSIS — M25512 Pain in left shoulder: Principal | ICD-10-CM

## 2018-02-18 NOTE — Therapy (Signed)
[facility] [address] [place], [place], [address] Phone: [phone]   Fax:  [phone]  Physical Therapy Treatment  Patient Details  Name: Molly Castaneda MRN: [id number] Date of Birth: 1960-11-14 Referring Provider (PT): Norlene Campbell, MD   Encounter Date: 02/18/2018  PT End of Session - 02/18/18 0906    Visit Number  11    Number of Visits  18    Date for PT Re-Evaluation  03/04/18    Authorization Type  Progress note every 10th visit.    PT Start Time  [phone]    PT Stop Time  0947    PT Time Calculation (min)  48 min    Activity Tolerance  Patient tolerated treatment well    Behavior During Therapy  [facility] for tasks assessed/performed       Past Medical History:  Diagnosis Date  . Arthritis   . Chronic back pain   . Chronic constipation   . Depression   . MVP (mitral valve prolapse)    had ablation 1998  . Numbness in left leg    s/p spinal surgery  . Supraventricular tachycardia (HCC)    ablation 1998  . Vitamin D deficiency     Past Surgical History:  Procedure Laterality Date  . AV NODE ABLATION  1998   for svt  . BACK SURGERY  2001   lumbar fusion  . PARTIAL HYSTERECTOMY  1993  . REPAIR EXTENSOR TENDON Left 03/06/2013   Procedure: LEFT LONG BOUTONNIERE REPAIR;  Surgeon: Wyn Forster., MD;  Location: [facility];  Service: Orthopedics;  Laterality: Left;  . SPINE SURGERY    . stimulation system implant  07/31/00   lumbar nerve stimulator-none funtioning now    There were no vitals filed for this visit.  Subjective Assessment - 02/18/18 0901    Subjective  Reports getting a pulley system and her TENS unit for home but did not bring them. Reports sleeping wihtout the sling for the first time since surgery.    Patient is accompained by:  Family member   Husband   Pertinent History  left RTC Repair 12/26/17; chronic pain, depression, internal stimulator but "dead"    Limitations  House hold  activities;Lifting    Diagnostic tests  X-Ray normal    Patient Stated Goals  less pain, use my arm better    Currently in Pain?  Yes    Pain Score  6     Pain Location  Shoulder    Pain Orientation  Left    Pain Descriptors / Indicators  Discomfort    Pain Type  Surgical pain    Pain Onset  1 to 4 weeks ago         [facility] PT Assessment - 02/18/18 0001      Assessment   Medical Diagnosis  Chronic left shoulder pain, S/P left RTC repair    Referring Provider (PT)  Norlene Campbell, MD    Onset Date/Surgical Date  12/26/17    Hand Dominance  Right    Next MD Visit  02/24/2018    Prior Therapy  yes      Precautions   Precautions  Shoulder    Type of Shoulder Precautions  rotator cuff repair    Shoulder Interventions  Shoulder sling/immobilizer                   [facility] Adult PT Treatment/Exercise - 02/18/18 0001      Shoulder Exercises:  Pulleys   Flexion  5 minutes    Other Pulley Exercises  sitting UE ranger x 5 mins flexion/extension, ciircles CW/CCW      Modalities   Modalities  Electrical Stimulation;Vasopneumatic      Electrical Stimulation   Electrical Stimulation Location  L shoulder    Electrical Stimulation Action  IFC    Electrical Stimulation Parameters  80-150 hz x15 min    Electrical Stimulation Goals  Pain      Vasopneumatic   Number Minutes Vasopneumatic   15 minutes    Vasopnuematic Location   Shoulder    Vasopneumatic Pressure  Low    Vasopneumatic Temperature   36      Manual Therapy   Manual Therapy  Passive ROM    Passive ROM  PROM of L shoulder into flexion, ER, IR with gentle holds at end range at intermittant oscillations                PT Short Term Goals - 01/14/18 [id number]      PT SHORT TERM GOAL #1   Title  Patient will be independent with initial HEP    Time  3    Period  Weeks    Status  New      PT SHORT TERM GOAL #2   Title  Patient will demonstrate 90+ degrees of left shoulder flexion PROM.    Time  3     Period  Weeks    Status  New      PT SHORT TERM GOAL #3   Title  Patient will demonstrate 20+ degrees of left shoulder ER PROM    Time  3    Period  Weeks    Status  New        PT Long Term Goals - 01/14/18 [id number]      PT LONG TERM GOAL #1   Title  Patient will be independent with advanced HEP    Time  6    Period  Weeks    Status  New      PT LONG TERM GOAL #2   Title  Patient will demonstrate 120+ degrees of left shoulder flexion AROM to improve ability to perform functional tasks.    Time  6    Period  Weeks    Status  New      PT LONG TERM GOAL #3   Title  Patient will demonstrate 60+ degrees of left shoulder external rotation AROM to improve ability to don/doff apparel    Time  6    Period  Weeks    Status  New      PT LONG TERM GOAL #4   Title  Patient will demonstrate 4/5 or greater left shoulder MMT in all planes to improve stability during functional tasks.    Time  6    Period  Weeks    Status  New      PT LONG TERM GOAL #5   Title  Patient will report ability to perform ADLs with left shoulder pain less than or equal to 4/10.    Time  6    Period  Weeks    Status  New            Plan - 02/18/18 0939    Clinical Impression Statement  Patient presented in clinic with continued discomfort and working towards weaning from sling. Patient still anxious regarding pain and any new exercises. Patient reported fatigue and discomfort with seated pulleys  and UE ranger. Firm end feels and smooth arc of motion noted with PROM of L shoulder today. Oscillations not utilized as often today during PROM session. Normal modalities response noted following removal of the modalities. Patient encouraged to utilize stimulation device but to only increase output to comfortable level and to ensure proper contact of electrodes to her skin.    Rehab Potential  Fair    Clinical Impairments Affecting Rehab Potential  chronic pain    PT Frequency  3x / week    PT Duration  6 weeks     PT Treatment/Interventions  Cryotherapy;ADLs/Self Care Home Management;Moist Heat;Therapeutic exercise;Therapeutic activities;Balance training;Neuromuscular re-education;Patient/family education;Manual techniques;Passive range of motion;Ultrasound;Electrical Stimulation    PT Next Visit Plan  PROM to left shoulder; modalties PRN for pain relief.  AAROM started with pulleys and seated UE ranger    Consulted and Agree with Plan of Care  Patient       Patient will benefit from skilled therapeutic intervention in order to improve the following deficits and impairments:  Pain, Impaired UE functional use, Decreased activity tolerance, Decreased range of motion, Decreased strength, Postural dysfunction  Visit Diagnosis: Chronic left shoulder pain  Stiffness of left shoulder, not elsewhere classified  Muscle weakness (generalized)     Problem List Patient Active Problem List   Diagnosis Date Noted  . Chronic left shoulder pain 09/18/2017  . Cervicalgia 09/18/2017  . Obesity (BMI 30.0-34.9) 05/20/2017  . Peripheral edema 09/07/2015  . Vitamin D deficiency 06/22/2014  . Depression 06/21/2014  . GAD (generalized anxiety disorder) 06/21/2014  . Back pain 06/21/2014  . Osteopenia 02/11/2013  . Constipation 05/08/2010    Marvell FullerKelsey P Jerra Huckeby, PTA 02/18/2018, 9:54 AM  [facility] [address] [place], [place], [address] Phone: [phone]   Fax:  [phone]  Name: Shannan HarperShelia A Bontempo MRN: [id number] Date of Birth: 06/30/1960

## 2018-02-20 ENCOUNTER — Ambulatory Visit: Payer: Medicare HMO | Admitting: *Deleted

## 2018-02-20 DIAGNOSIS — G8929 Other chronic pain: Secondary | ICD-10-CM

## 2018-02-20 DIAGNOSIS — M6281 Muscle weakness (generalized): Secondary | ICD-10-CM | POA: Diagnosis not present

## 2018-02-20 DIAGNOSIS — M25512 Pain in left shoulder: Secondary | ICD-10-CM | POA: Diagnosis not present

## 2018-02-20 DIAGNOSIS — M25612 Stiffness of left shoulder, not elsewhere classified: Secondary | ICD-10-CM | POA: Diagnosis not present

## 2018-02-20 NOTE — Therapy (Signed)
[facility] [address] [place], [place], [address] Phone: [phone]   Fax:  [phone]  Physical Therapy Treatment  Patient Details  Name: Molly HarperShelia A Castaneda MRN: [id number] Date of Birth: August 05, 1960 Referring Provider (PT): Norlene CampbellPeter Whitfield, MD   Encounter Date: 02/20/2018  PT End of Session - 02/20/18 0851    Visit Number  12    Number of Visits  18    Date for PT Re-Evaluation  03/04/18    Authorization Type  Progress note every 10th visit.    PT Start Time  0900    PT Stop Time  0953    PT Time Calculation (min)  53 min       Past Medical History:  Diagnosis Date  . Arthritis   . Chronic back pain   . Chronic constipation   . Depression   . MVP (mitral valve prolapse)    had ablation 1998  . Numbness in left leg    s/p spinal surgery  . Supraventricular tachycardia (HCC)    ablation 1998  . Vitamin D deficiency     Past Surgical History:  Procedure Laterality Date  . AV NODE ABLATION  1998   for svt  . BACK SURGERY  2001   lumbar fusion  . PARTIAL HYSTERECTOMY  1993  . REPAIR EXTENSOR TENDON Left 03/06/2013   Procedure: LEFT LONG BOUTONNIERE REPAIR;  Surgeon: Wyn Forsterobert V Sypher Jr., MD;  Location: [facility];  Service: Orthopedics;  Laterality: Left;  . SPINE SURGERY    . stimulation system implant  07/31/00   lumbar nerve stimulator-none funtioning now    There were no vitals filed for this visit.  Subjective Assessment - 02/20/18 0848    Subjective  LT shldr is sore from HEP.    Patient is accompained by:  Family member    Pertinent History  left RTC Repair 12/26/17; chronic pain, depression, internal stimulator but "dead"    Limitations  House hold activities;Lifting    Diagnostic tests  X-Ray normal    Patient Stated Goals  less pain, use my arm better    Currently in Pain?  Yes    Pain Score  6     Pain Location  Shoulder    Pain Orientation  Left    Pain Type  Surgical pain    Pain Onset   1 to 4 weeks ago    Pain Frequency  Constant                       [facility] Adult PT Treatment/Exercise - 02/20/18 0001      Shoulder Exercises: Pulleys   Flexion  5 minutes    Other Pulley Exercises  sitting UE ranger x 5 mins flexion/extension, ciircles CW/CCW      Modalities   Modalities  Electrical Stimulation;Vasopneumatic      Electrical Stimulation   Electrical Stimulation Location  L shoulder IFC x 15 mins 80-150hz     Electrical Stimulation Goals  Pain      Vasopneumatic   Number Minutes Vasopneumatic   15 minutes    Vasopnuematic Location   Shoulder    Vasopneumatic Pressure  Low    Vasopneumatic Temperature   36      Manual Therapy   Manual Therapy  Passive ROM    Manual therapy comments  Rhytmic stab for IR/ER     Passive ROM  PROM of L shoulder into flexion 135 degrees, ER 55 degrees, IR  with gentle holds at end range at intermittant oscillations                PT Short Term Goals - 01/14/18 0921      PT SHORT TERM GOAL #1   Title  Patient will be independent with initial HEP    Time  3    Period  Weeks    Status  New      PT SHORT TERM GOAL #2   Title  Patient will demonstrate 90+ degrees of left shoulder flexion PROM.    Time  3    Period  Weeks    Status  New      PT SHORT TERM GOAL #3   Title  Patient will demonstrate 20+ degrees of left shoulder ER PROM    Time  3    Period  Weeks    Status  New        PT Long Term Goals - 01/14/18 6389      PT LONG TERM GOAL #1   Title  Patient will be independent with advanced HEP    Time  6    Period  Weeks    Status  New      PT LONG TERM GOAL #2   Title  Patient will demonstrate 120+ degrees of left shoulder flexion AROM to improve ability to perform functional tasks.    Time  6    Period  Weeks    Status  New      PT LONG TERM GOAL #3   Title  Patient will demonstrate 60+ degrees of left shoulder external rotation AROM to improve ability to don/doff apparel    Time  6     Period  Weeks    Status  New      PT LONG TERM GOAL #4   Title  Patient will demonstrate 4/5 or greater left shoulder MMT in all planes to improve stability during functional tasks.    Time  6    Period  Weeks    Status  New      PT LONG TERM GOAL #5   Title  Patient will report ability to perform ADLs with left shoulder pain less than or equal to 4/10.    Time  6    Period  Weeks    Status  New            Plan - 02/20/18 0907    Clinical Impression Statement  Pt arrived today with complaints of ACJ pain and soreness. Pt advised to move pulley system over LT shldr at home for better alignment to decrease ACJ pain. She did well withpulleys in clinic . Pt able to perform all AAROM  and did well with rhythmic stab IR/ER. PROM for ER 55 degrees and flexion to 135 degrees.    Clinical Presentation  Stable    Clinical Decision Making  Moderate    Rehab Potential  Fair    Clinical Impairments Affecting Rehab Potential  chronic pain    PT Frequency  3x / week    PT Duration  6 weeks    PT Treatment/Interventions  Cryotherapy;ADLs/Self Care Home Management;Moist Heat;Therapeutic exercise;Therapeutic activities;Balance training;Neuromuscular re-education;Patient/family education;Manual techniques;Passive range of motion;Ultrasound;Electrical Stimulation    PT Next Visit Plan  PROM to left shoulder; modalties PRN for pain relief.  AAROM started with pulleys and seated UE ranger    Consulted and Agree with Plan of Care  Patient  Patient will benefit from skilled therapeutic intervention in order to improve the following deficits and impairments:  Pain, Impaired UE functional use, Decreased activity tolerance, Decreased range of motion, Decreased strength, Postural dysfunction  Visit Diagnosis: Chronic left shoulder pain  Stiffness of left shoulder, not elsewhere classified  Muscle weakness (generalized)     Problem List Patient Active Problem List   Diagnosis Date Noted   . Chronic left shoulder pain 09/18/2017  . Cervicalgia 09/18/2017  . Obesity (BMI 30.0-34.9) 05/20/2017  . Peripheral edema 09/07/2015  . Vitamin D deficiency 06/22/2014  . Depression 06/21/2014  . GAD (generalized anxiety disorder) 06/21/2014  . Back pain 06/21/2014  . Osteopenia 02/11/2013  . Constipation 05/08/2010    Marguerita Stapp,CHRIS, PTA 02/20/2018, 10:47 AM  [facility] [address] [place], [place], [address] Phone: [phone]   Fax:  [phone]  Name: Molly Castaneda MRN: [id number] Date of Birth: 02/28/1960

## 2018-02-21 ENCOUNTER — Encounter: Payer: Medicare HMO | Admitting: Physical Therapy

## 2018-02-21 DIAGNOSIS — M545 Low back pain: Secondary | ICD-10-CM | POA: Diagnosis not present

## 2018-02-21 DIAGNOSIS — Z79891 Long term (current) use of opiate analgesic: Secondary | ICD-10-CM | POA: Diagnosis not present

## 2018-02-21 DIAGNOSIS — M5432 Sciatica, left side: Secondary | ICD-10-CM | POA: Diagnosis not present

## 2018-02-21 DIAGNOSIS — M79605 Pain in left leg: Secondary | ICD-10-CM | POA: Diagnosis not present

## 2018-02-21 DIAGNOSIS — R69 Illness, unspecified: Secondary | ICD-10-CM | POA: Diagnosis not present

## 2018-02-21 DIAGNOSIS — G8921 Chronic pain due to trauma: Secondary | ICD-10-CM | POA: Diagnosis not present

## 2018-02-24 ENCOUNTER — Encounter: Payer: Self-pay | Admitting: Physical Therapy

## 2018-02-24 ENCOUNTER — Ambulatory Visit: Payer: Medicare HMO | Admitting: Physical Therapy

## 2018-02-24 DIAGNOSIS — M25512 Pain in left shoulder: Principal | ICD-10-CM

## 2018-02-24 DIAGNOSIS — M25612 Stiffness of left shoulder, not elsewhere classified: Secondary | ICD-10-CM

## 2018-02-24 DIAGNOSIS — G8929 Other chronic pain: Secondary | ICD-10-CM | POA: Diagnosis not present

## 2018-02-24 DIAGNOSIS — M6281 Muscle weakness (generalized): Secondary | ICD-10-CM | POA: Diagnosis not present

## 2018-02-24 NOTE — Therapy (Signed)
[facility] [address] [place], [place], [address] Phone: [phone]   Fax:  [phone]  Physical Therapy Treatment  Patient Details  Name: Molly Castaneda MRN: [id number] Date of Birth: [birth year] Referring Provider (PT): Norlene CampbellPeter Whitfield, MD   Encounter Date: 02/24/2018  PT End of Session - 02/24/18 0904    Visit Number  13    Number of Visits  18    Date for PT Re-Evaluation  03/04/18    Authorization Type  Progress note every 10th visit.    PT Start Time  0901    PT Stop Time  0951    PT Time Calculation (min)  50 min    Equipment Utilized During Treatment  Other (comment)   sling   Activity Tolerance  Patient tolerated treatment well    Behavior During Therapy  [facility] for tasks assessed/performed       Past Medical History:  Diagnosis Date  . Arthritis   . Chronic back pain   . Chronic constipation   . Depression   . MVP (mitral valve prolapse)    had ablation 1998  . Numbness in left leg    s/p spinal surgery  . Supraventricular tachycardia (HCC)    ablation 1998  . Vitamin D deficiency     Past Surgical History:  Procedure Laterality Date  . AV NODE ABLATION  1998   for svt  . BACK SURGERY  2001   lumbar fusion  . PARTIAL HYSTERECTOMY  1993  . REPAIR EXTENSOR TENDON Left 03/06/2013   Procedure: LEFT LONG BOUTONNIERE REPAIR;  Surgeon: Wyn Forsterobert V Sypher Jr., MD;  Location: [facility];  Service: Orthopedics;  Laterality: Left;  . SPINE SURGERY    . stimulation system implant  07/31/00   lumbar nerve stimulator-none funtioning now    There were no vitals filed for this visit.  Subjective Assessment - 02/24/18 0902    Subjective  Reports continued increased pain since PROM during last session and pushing ROM due to measurements.    Pertinent History  left RTC Repair 12/26/17; chronic pain, depression, internal stimulator but "dead"    Limitations  House hold activities;Lifting    Diagnostic tests   X-Ray normal    Patient Stated Goals  less pain, use my arm better    Currently in Pain?  Yes    Pain Score  7     Pain Location  Shoulder    Pain Orientation  Left    Pain Descriptors / Indicators  Aching;Sore;Discomfort    Pain Type  Surgical pain    Pain Onset  1 to 4 weeks ago    Pain Frequency  Constant         [facility] PT Assessment - 02/24/18 0001      Assessment   Medical Diagnosis  Chronic left shoulder pain, S/P left RTC repair    Referring Provider (PT)  Norlene CampbellPeter Whitfield, MD    Onset Date/Surgical Date  12/26/17    Hand Dominance  Right    Next MD Visit  02/26/2018    Prior Therapy  yes      Precautions   Precautions  Shoulder    Type of Shoulder Precautions  rotator cuff repair    Shoulder Interventions  Shoulder sling/immobilizer                   [facility] Adult PT Treatment/Exercise - 02/24/18 0001      Exercises   Exercises  Shoulder  Shoulder Exercises: Pulleys   Flexion  5 minutes    Other Pulley Exercises  sitting UE ranger x 5 mins flexion/extension, ciircles CW/CCW      Modalities   Modalities  Electrical Stimulation;Vasopneumatic      Electrical Stimulation   Electrical Stimulation Location  L shoulder    Electrical Stimulation Action  IFC    Electrical Stimulation Parameters  80-150 hz x15 min    Electrical Stimulation Goals  Pain      Vasopneumatic   Number Minutes Vasopneumatic   15 minutes    Vasopnuematic Location   Shoulder    Vasopneumatic Pressure  Low    Vasopneumatic Temperature   36      Manual Therapy   Manual Therapy  Passive ROM    Passive ROM  PROM of L shoulder into flexion 135 degrees, ER 55 degrees, IR with gentle holds at end range at intermittant oscillations                PT Short Term Goals - 01/14/18 [id number]      PT SHORT TERM GOAL #1   Title  Patient will be independent with initial HEP    Time  3    Period  Weeks    Status  New      PT SHORT TERM GOAL #2   Title  Patient will demonstrate  90+ degrees of left shoulder flexion PROM.    Time  3    Period  Weeks    Status  New      PT SHORT TERM GOAL #3   Title  Patient will demonstrate 20+ degrees of left shoulder ER PROM    Time  3    Period  Weeks    Status  New        PT Long Term Goals - 01/14/18 [id number]      PT LONG TERM GOAL #1   Title  Patient will be independent with advanced HEP    Time  6    Period  Weeks    Status  New      PT LONG TERM GOAL #2   Title  Patient will demonstrate 120+ degrees of left shoulder flexion AROM to improve ability to perform functional tasks.    Time  6    Period  Weeks    Status  New      PT LONG TERM GOAL #3   Title  Patient will demonstrate 60+ degrees of left shoulder external rotation AROM to improve ability to don/doff apparel    Time  6    Period  Weeks    Status  New      PT LONG TERM GOAL #4   Title  Patient will demonstrate 4/5 or greater left shoulder MMT in all planes to improve stability during functional tasks.    Time  6    Period  Weeks    Status  New      PT LONG TERM GOAL #5   Title  Patient will report ability to perform ADLs with left shoulder pain less than or equal to 4/10.    Time  6    Period  Weeks    Status  New            Plan - 02/24/18 [id number]    Clinical Impression Statement  Patient presented in clinic with reports of increased pain since previous treatment due to increased PROM. Patient reported increased pain in L UT region  which she had previously not been experiencing. Patient able to tolerate gentle AAROM in sitting today. PROM completed to reduce any pain and no complaints by patient during PROM session. Firm end feels and smooth arc of motion noted during PROM of L shoulder in all directions assessed. Normal modalities response noted following removal of the modalities. Patient educated regarding electrode placement for L shoulder for her TENS unit at home and emphasized to increase output based on comfort level.    Rehab Potential   Fair    Clinical Impairments Affecting Rehab Potential  chronic pain    PT Frequency  3x / week    PT Duration  6 weeks    PT Treatment/Interventions  Cryotherapy;ADLs/Self Care Home Management;Moist Heat;Therapeutic exercise;Therapeutic activities;Balance training;Neuromuscular re-education;Patient/family education;Manual techniques;Passive range of motion;Ultrasound;Electrical Stimulation    PT Next Visit Plan  PROM to left shoulder; modalties PRN for pain relief.  AAROM started with pulleys and seated UE ranger    Consulted and Agree with Plan of Care  Patient       Patient will benefit from skilled therapeutic intervention in order to improve the following deficits and impairments:  Pain, Impaired UE functional use, Decreased activity tolerance, Decreased range of motion, Decreased strength, Postural dysfunction  Visit Diagnosis: Chronic left shoulder pain  Stiffness of left shoulder, not elsewhere classified  Muscle weakness (generalized)     Problem List Patient Active Problem List   Diagnosis Date Noted  . Chronic left shoulder pain 09/18/2017  . Cervicalgia 09/18/2017  . Obesity (BMI 30.0-34.9) 05/20/2017  . Peripheral edema 09/07/2015  . Vitamin D deficiency 06/22/2014  . Depression 06/21/2014  . GAD (generalized anxiety disorder) 06/21/2014  . Back pain 06/21/2014  . Osteopenia 02/11/2013  . Constipation 05/08/2010    Marvell Fuller, PTA 02/24/2018, 9:54 AM  [facility] [address] [place], [place], [address] Phone: [phone]   Fax:  [phone]  Name: Molly Castaneda MRN: [id number] Date of Birth: 02-14-1960

## 2018-02-25 ENCOUNTER — Encounter: Payer: Self-pay | Admitting: Physical Therapy

## 2018-02-25 ENCOUNTER — Ambulatory Visit: Payer: Medicare HMO | Admitting: Physical Therapy

## 2018-02-25 DIAGNOSIS — M25512 Pain in left shoulder: Principal | ICD-10-CM

## 2018-02-25 DIAGNOSIS — M25612 Stiffness of left shoulder, not elsewhere classified: Secondary | ICD-10-CM | POA: Diagnosis not present

## 2018-02-25 DIAGNOSIS — G8929 Other chronic pain: Secondary | ICD-10-CM

## 2018-02-25 DIAGNOSIS — M6281 Muscle weakness (generalized): Secondary | ICD-10-CM
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
## 2018-02-26 ENCOUNTER — Encounter (INDEPENDENT_AMBULATORY_CARE_PROVIDER_SITE_OTHER): Payer: Self-pay | Admitting: Orthopaedic Surgery

## 2018-02-26 ENCOUNTER — Ambulatory Visit (INDEPENDENT_AMBULATORY_CARE_PROVIDER_SITE_OTHER): Payer: Medicare HMO | Admitting: Orthopaedic Surgery

## 2018-02-26 VITALS — BP 112/72 | HR 89 | Ht 72.0 in | Wt 222.0 lb

## 2018-02-26 DIAGNOSIS — G8929 Other chronic pain: Secondary | ICD-10-CM

## 2018-02-26 DIAGNOSIS — M25512 Pain in left shoulder: Secondary | ICD-10-CM

## 2018-02-26 NOTE — Progress Notes (Signed)
Office Visit Note   Patient: Molly Castaneda           Date of Birth: 1960/05/11           MRN: [id number] Visit Date: 02/26/2018              Requested by: [name], FNP [address] [place], [place] [address] PCP: [name], FNP   Assessment & Plan: Visit Diagnoses:  1. Chronic left shoulder pain     Plan: 9 weeks status post rotator cuff tear repair left shoulder.  Has developed adhesive capsulitis.  Long discussion with Mrs. Schleiger and her husband regarding need for range of motion outside of therapy.  She has a pulley system.  I have discussed techniques for motion.  We will see her back in 1 month.  She is not working and does not need a note  Follow-Up Instructions: Return in about 1 month (around 03/29/2018).   Orders:  No orders of the defined types were placed in this encounter.  No orders of the defined types were placed in this encounter.     Procedures: No procedures performed   Clinical Data: No additional findings.   Subjective: Chief Complaint  Patient presents with  . Left Shoulder - Follow-up    12/26/17  Left Shoulder Arthroscopy, SAD, DCE, Mini Open Rotator Cuff Repair  Patient returns for three week follow up of her left shoulder. She is status post Left Shoulder Arthroscopy, SAD, DCE, Mini Open Rotator Cuff Repair on 12/26/17. She is attending physical therapy. She states that therapy is hard, but she is improving. She takes morphine for chronic pain.   HPI  Review of Systems   Objective: Vital Signs: BP 112/72   Pulse 89   Ht 6' (1.829 m)   Wt 222 lb (100.7 kg)   BMI 30.11 kg/m   Physical Exam  Ortho Exam awake alert and oriented x3.  Comfortable sitting.  Has adhesive capsulitis left shoulder.  I can just about passively abduct about 90 degrees and flex about 110.  She can maintain that position.  Good grip and good release.  Specialty Comments:  No specialty comments available.  Imaging: No results  found.   PMFS History: Patient Active Problem List   Diagnosis Date Noted  . Chronic left shoulder pain 09/18/2017  . Cervicalgia 09/18/2017  . Obesity (BMI 30.0-34.9) 05/20/2017  . Peripheral edema 09/07/2015  . Vitamin D deficiency 06/22/2014  . Depression 06/21/2014  . GAD (generalized anxiety disorder) 06/21/2014  . Back pain 06/21/2014  . Osteopenia 02/11/2013  . Constipation 05/08/2010   Past Medical History:  Diagnosis Date  . Arthritis   . Chronic back pain   . Chronic constipation   . Depression   . MVP (mitral valve prolapse)    had ablation 1998  . Numbness in left leg    s/p spinal surgery  . Supraventricular tachycardia (HCC)    ablation 1998  . Vitamin D deficiency     Family History  Problem Relation Age of Onset  . Coronary artery disease Father   . Colon polyps Father   . Prostate cancer Maternal Grandfather   . Hodgkin's lymphoma Son   . Liver cancer Paternal Uncle        ?  Marland Kitchen Arthritis Mother   . Colon cancer Neg Hx     Past Surgical History:  Procedure Laterality Date  . AV NODE ABLATION  1998   for svt  .  BACK SURGERY  2001   lumbar fusion  . PARTIAL HYSTERECTOMY  1993  . REPAIR EXTENSOR TENDON Left 03/06/2013   Procedure: LEFT LONG BOUTONNIERE REPAIR;  Surgeon: Wyn Forster., MD;  Location: [facility];  Service: Orthopedics;  Laterality: Left;  . SPINE SURGERY    . stimulation system implant  07/31/00   lumbar nerve stimulator-none funtioning now   Social History   Occupational History  . Occupation: disabled    Associate Professor: UNEMPLOYED  Tobacco Use  . Smoking status: Never Smoker  . Smokeless tobacco: Never Used  Substance and Sexual Activity  . Alcohol use: No  . Drug use: No  . Sexual activity: Never

## 2018-03-03 ENCOUNTER — Ambulatory Visit: Payer: Medicare HMO | Admitting: Physical Therapy

## 2018-03-03 ENCOUNTER — Encounter: Payer: Self-pay | Admitting: Physical Therapy

## 2018-03-03 DIAGNOSIS — G8929 Other chronic pain: Secondary | ICD-10-CM

## 2018-03-03 DIAGNOSIS — M6281 Muscle weakness (generalized): Secondary | ICD-10-CM | POA: Diagnosis not present

## 2018-03-03 DIAGNOSIS — M25612 Stiffness of left shoulder, not elsewhere classified: Secondary | ICD-10-CM | POA: Diagnosis not present

## 2018-03-03 DIAGNOSIS — M25512 Pain in left shoulder: Secondary | ICD-10-CM | POA: Diagnosis not present

## 2018-03-03 NOTE — Therapy (Signed)
[facility] [address] [place], [place], [address] Phone: [phone]   Fax:  [phone]  Physical Therapy Treatment  Patient Details  Name: Molly Castaneda MRN: [id number] Date of Birth: [birth year] Referring Provider (PT): Norlene Campbell, MD   Encounter Date: 03/03/2018  PT End of Session - 03/03/18 0903    Visit Number  15    Number of Visits  24    Date for PT Re-Evaluation  03/21/18    Authorization Type  Progress note every 10th visit.    PT Start Time  0901    PT Stop Time  0947    PT Time Calculation (min)  46 min    Activity Tolerance  Patient tolerated treatment well    Behavior During Therapy  [facility] for tasks assessed/performed       Past Medical History:  Diagnosis Date  . Arthritis   . Chronic back pain   . Chronic constipation   . Depression   . MVP (mitral valve prolapse)    had ablation 1998  . Numbness in left leg    s/p spinal surgery  . Supraventricular tachycardia (HCC)    ablation 1998  . Vitamin D deficiency     Past Surgical History:  Procedure Laterality Date  . AV NODE ABLATION  1998   for svt  . BACK SURGERY  2001   lumbar fusion  . PARTIAL HYSTERECTOMY  1993  . REPAIR EXTENSOR TENDON Left 03/06/2013   Procedure: LEFT LONG BOUTONNIERE REPAIR;  Surgeon: Wyn Forster., MD;  Location: [facility];  Service: Orthopedics;  Laterality: Left;  . SPINE SURGERY    . stimulation system implant  07/31/00   lumbar nerve stimulator-none funtioning now    There were no vitals filed for this visit.  Subjective Assessment - 03/03/18 0902    Subjective  Reports that MD said her shoulder was stuck and wanted aggressive ROM. Patient reports inability to complete wall ladder.    Pertinent History  left RTC Repair 12/26/17; chronic pain, depression, internal stimulator but "dead"    Limitations  House hold activities;Lifting    Diagnostic tests  X-Ray normal    Patient Stated Goals  less  pain, use my arm better    Currently in Pain?  Yes    Pain Score  5     Pain Location  Shoulder    Pain Orientation  Left    Pain Descriptors / Indicators  Sore;Discomfort    Pain Type  Surgical pain    Pain Onset  More than a month ago         [facility] PT Assessment - 03/03/18 0001      Assessment   Medical Diagnosis  Chronic left shoulder pain, S/P left RTC repair    Referring Provider (PT)  Norlene Campbell, MD    Onset Date/Surgical Date  12/26/17    Hand Dominance  Right    Next MD Visit  03/19/2018    Prior Therapy  yes      Precautions   Precautions  Shoulder    Type of Shoulder Precautions  rotator cuff repair    Shoulder Interventions  Shoulder sling/immobilizer                   [facility] Adult PT Treatment/Exercise - 03/03/18 0001      Shoulder Exercises: Supine   Flexion  AAROM;Both;20 reps   gentle holds at end range     Shoulder Exercises:  Pulleys   Flexion  5 minutes    Other Pulley Exercises  sitting UE ranger x 5 mins flexion/extension, ciircles CW/CCW      Modalities   Modalities  Electrical Stimulation;Vasopneumatic      Electrical Stimulation   Electrical Stimulation Location  L shoulder    Electrical Stimulation Action  IFC    Electrical Stimulation Parameters  80-150 hz x15 min    Electrical Stimulation Goals  Pain      Vasopneumatic   Number Minutes Vasopneumatic   15 minutes    Vasopnuematic Location   Shoulder    Vasopneumatic Pressure  Low    Vasopneumatic Temperature   34      Manual Therapy   Manual Therapy  Passive ROM    Passive ROM  PROM of L shoulder into flexion, ER, IR with gentle holds at end range               PT Short Term Goals - 02/25/18 0951      PT SHORT TERM GOAL #1   Title  Patient will be independent with initial HEP    Time  3    Period  Weeks    Status  Achieved      PT SHORT TERM GOAL #2   Title  Patient will demonstrate 90+ degrees of left shoulder flexion PROM.    Time  3    Period   Weeks    Status  Achieved      PT SHORT TERM GOAL #3   Title  Patient will demonstrate 20+ degrees of left shoulder ER PROM    Time  3    Period  Weeks    Status  Achieved        PT Long Term Goals - 02/25/18 0354      PT LONG TERM GOAL #1   Title  Patient will be independent with advanced HEP    Time  6    Period  Weeks    Status  On-going      PT LONG TERM GOAL #2   Title  Patient will demonstrate 120+ degrees of left shoulder flexion AROM to improve ability to perform functional tasks.    Time  6    Period  Weeks    Status  On-going      PT LONG TERM GOAL #3   Title  Patient will demonstrate 60+ degrees of left shoulder external rotation AROM to improve ability to don/doff apparel    Time  6    Period  Weeks    Status  On-going      PT LONG TERM GOAL #4   Title  Patient will demonstrate 4/5 or greater left shoulder MMT in all planes to improve stability during functional tasks.    Time  6    Period  Weeks    Status  On-going      PT LONG TERM GOAL #5   Title  Patient will report ability to perform ADLs with left shoulder pain less than or equal to 4/10.    Time  6    Period  Weeks    Status  On-going            Plan - 03/03/18 6568    Clinical Impression Statement  Patient's ROM progressed due to MD requests for aggressive ROM. Patient continues to report 5-6/10 L shoulder pain even little activity. Patient still anxious around others as she reports that want to hug and  pat her shoulder. AAROM flexion in supine introduced with gentle prolonged holds at end range to progress ROM. Gentle prolonged holds completed with PROM of L shoulder into flexion as well today. Firm end feels and smooth arc of motion noted with all directions of PROM assessed today. Normal modalities response noted following removal of the modalities.    Rehab Potential  Fair    Clinical Impairments Affecting Rehab Potential  chronic pain    PT Frequency  3x / week    PT Duration  6 weeks     PT Treatment/Interventions  Cryotherapy;ADLs/Self Care Home Management;Moist Heat;Therapeutic exercise;Therapeutic activities;Balance training;Neuromuscular re-education;Patient/family education;Manual techniques;Passive range of motion;Ultrasound;Electrical Stimulation    PT Next Visit Plan  Aggressive ROM per MD requests. Patient to return to MD in 3 weeks.    Consulted and Agree with Plan of Care  Patient       Patient will benefit from skilled therapeutic intervention in order to improve the following deficits and impairments:  Pain, Impaired UE functional use, Decreased activity tolerance, Decreased range of motion, Decreased strength, Postural dysfunction  Visit Diagnosis: Chronic left shoulder pain  Stiffness of left shoulder, not elsewhere classified  Muscle weakness (generalized)     Problem List Patient Active Problem List   Diagnosis Date Noted  . Chronic left shoulder pain 09/18/2017  . Cervicalgia 09/18/2017  . Obesity (BMI 30.0-34.9) 05/20/2017  . Peripheral edema 09/07/2015  . Vitamin D deficiency 06/22/2014  . Depression 06/21/2014  . GAD (generalized anxiety disorder) 06/21/2014  . Back pain 06/21/2014  . Osteopenia 02/11/2013  . Constipation 05/08/2010    Marvell FullerKelsey P Kennon, PTA 03/03/2018, 3:39 PM  [facility] [address] [place], [place], [address] Phone: [phone]   Fax:  [phone]  Name: Shannan HarperShelia A Rought MRN: [id number] Date of Birth: 02/23/1960

## 2018-03-04 ENCOUNTER — Ambulatory Visit: Payer: Medicare HMO | Admitting: *Deleted

## 2018-03-04 DIAGNOSIS — G8929 Other chronic pain: Secondary | ICD-10-CM | POA: Diagnosis not present

## 2018-03-04 DIAGNOSIS — M25612 Stiffness of left shoulder, not elsewhere classified: Secondary | ICD-10-CM | POA: Diagnosis not present

## 2018-03-04 DIAGNOSIS — M6281 Muscle weakness (generalized): Secondary | ICD-10-CM

## 2018-03-04 DIAGNOSIS — M25512 Pain in left shoulder: Principal | ICD-10-CM

## 2018-03-04 NOTE — Therapy (Signed)
[facility] [address] [place], [place], [address] Phone: [phone]   Fax:  [phone]  Physical Therapy Treatment  Patient Details  Name: Molly HarperShelia A Gouge MRN: [id number] Date of Birth: 1961/02/05 Referring Provider (PT): Norlene CampbellPeter Whitfield, MD   Encounter Date: 03/04/2018  PT End of Session - 03/04/18 1345    Visit Number  16    Number of Visits  24    Date for PT Re-Evaluation  03/21/18    Authorization Type  Progress note every 10th visit.    PT Start Time  0900    PT Stop Time  0956    PT Time Calculation (min)  56 min       Past Medical History:  Diagnosis Date  . Arthritis   . Chronic back pain   . Chronic constipation   . Depression   . MVP (mitral valve prolapse)    had ablation 1998  . Numbness in left leg    s/p spinal surgery  . Supraventricular tachycardia (HCC)    ablation 1998  . Vitamin D deficiency     Past Surgical History:  Procedure Laterality Date  . AV NODE ABLATION  1998   for svt  . BACK SURGERY  2001   lumbar fusion  . PARTIAL HYSTERECTOMY  1993  . REPAIR EXTENSOR TENDON Left 03/06/2013   Procedure: LEFT LONG BOUTONNIERE REPAIR;  Surgeon: Wyn Forsterobert V Sypher Jr., MD;  Location: [facility];  Service: Orthopedics;  Laterality: Left;  . SPINE SURGERY    . stimulation system implant  07/31/00   lumbar nerve stimulator-none funtioning now    There were no vitals filed for this visit.  Subjective Assessment - 03/04/18 1340    Subjective  Reports that MD said her shoulder was stuck and wanted aggressive ROM.    Patient is accompained by:  Family member    Pertinent History  left RTC Repair 12/26/17; chronic pain, depression, internal stimulator but "dead"    Limitations  House hold activities;Lifting    Diagnostic tests  X-Ray normal    Patient Stated Goals  less pain, use my arm better    Currently in Pain?  Yes    Pain Score  5     Pain Location  Shoulder    Pain Orientation  Left     Pain Descriptors / Indicators  Sore;Discomfort    Pain Onset  More than a month ago    Pain Frequency  Constant                       [facility] Adult PT Treatment/Exercise - 03/04/18 0001      Shoulder Exercises: Pulleys   Flexion  5 minutes    Other Pulley Exercises  sitting UE ranger x 5 mins flexion/extension, ciircles CW/CCW      Modalities   Modalities  Electrical Stimulation;Vasopneumatic      Electrical Stimulation   Electrical Stimulation Location  L shoulder   IFC x 15 mins 80-150hz     Electrical Stimulation Goals  Pain      Vasopneumatic   Number Minutes Vasopneumatic   15 minutes    Vasopnuematic Location   Shoulder    Vasopneumatic Pressure  Low    Vasopneumatic Temperature   34      Manual Therapy   Manual Therapy  Passive ROM    Passive ROM  PROM of L shoulder into flexion, ER, IR with long  holds at end range  PT Short Term Goals - 02/25/18 0951      PT SHORT TERM GOAL #1   Title  Patient will be independent with initial HEP    Time  3    Period  Weeks    Status  Achieved      PT SHORT TERM GOAL #2   Title  Patient will demonstrate 90+ degrees of left shoulder flexion PROM.    Time  3    Period  Weeks    Status  Achieved      PT SHORT TERM GOAL #3   Title  Patient will demonstrate 20+ degrees of left shoulder ER PROM    Time  3    Period  Weeks    Status  Achieved        PT Long Term Goals - 02/25/18 5638      PT LONG TERM GOAL #1   Title  Patient will be independent with advanced HEP    Time  6    Period  Weeks    Status  On-going      PT LONG TERM GOAL #2   Title  Patient will demonstrate 120+ degrees of left shoulder flexion AROM to improve ability to perform functional tasks.    Time  6    Period  Weeks    Status  On-going      PT LONG TERM GOAL #3   Title  Patient will demonstrate 60+ degrees of left shoulder external rotation AROM to improve ability to don/doff apparel    Time  6    Period   Weeks    Status  On-going      PT LONG TERM GOAL #4   Title  Patient will demonstrate 4/5 or greater left shoulder MMT in all planes to improve stability during functional tasks.    Time  6    Period  Weeks    Status  On-going      PT LONG TERM GOAL #5   Title  Patient will report ability to perform ADLs with left shoulder pain less than or equal to 4/10.    Time  6    Period  Weeks    Status  On-going            Plan - 03/04/18 1035    Clinical Impression Statement  Pt arrived today sore from last Rx. She reports stretching RT shldr more at home and was advised to stretch every 2 hours. She did well with AAROM exs and aggressive PROM. Softer end-feel today. Normal modality response. PROM for ER 58 degrees.    Clinical Decision Making  Moderate    Rehab Potential  Fair    Clinical Impairments Affecting Rehab Potential  chronic pain    PT Frequency  3x / week    PT Duration  6 weeks    PT Treatment/Interventions  Cryotherapy;ADLs/Self Care Home Management;Moist Heat;Therapeutic exercise;Therapeutic activities;Balance training;Neuromuscular re-education;Patient/family education;Manual techniques;Passive range of motion;Ultrasound;Electrical Stimulation    PT Next Visit Plan  Aggressive ROM per MD requests. Patient to return to MD in 3 weeks.    Consulted and Agree with Plan of Care  Patient       Patient will benefit from skilled therapeutic intervention in order to improve the following deficits and impairments:  Pain, Impaired UE functional use, Decreased activity tolerance, Decreased range of motion, Decreased strength, Postural dysfunction  Visit Diagnosis: Chronic left shoulder pain  Stiffness of left shoulder, not elsewhere classified  Muscle weakness (  generalized)     Problem List Patient Active Problem List   Diagnosis Date Noted  . Chronic left shoulder pain 09/18/2017  . Cervicalgia 09/18/2017  . Obesity (BMI 30.0-34.9) 05/20/2017  . Peripheral edema  09/07/2015  . Vitamin D deficiency 06/22/2014  . Depression 06/21/2014  . GAD (generalized anxiety disorder) 06/21/2014  . Back pain 06/21/2014  . Osteopenia 02/11/2013  . Constipation 05/08/2010    Mavis Gravelle,CHRIS, PTA 03/04/2018, 3:02 PM  [facility] [address] [place], [place], [address] Phone: [phone]   Fax:  [phone]  Name: Molly HarperShelia A Howdyshell MRN: [id number] Date of Birth: 08-05-1960

## 2018-03-06 ENCOUNTER — Other Ambulatory Visit: Payer: Self-pay | Admitting: Family

## 2018-03-06 DIAGNOSIS — F32A Depression, unspecified: Secondary | ICD-10-CM

## 2018-03-06 DIAGNOSIS — F329 Major depressive disorder, single episode, unspecified: Secondary | ICD-10-CM

## 2018-03-06 DIAGNOSIS — F411 Generalized anxiety disorder: Secondary | ICD-10-CM

## 2018-03-06 NOTE — Telephone Encounter (Signed)
OV 12/10/17 rtc 6 mos

## 2018-03-11 ENCOUNTER — Ambulatory Visit: Payer: Medicare HMO | Attending: Orthopaedic Surgery | Admitting: *Deleted

## 2018-03-11 DIAGNOSIS — M25512 Pain in left shoulder: Secondary | ICD-10-CM | POA: Diagnosis not present

## 2018-03-11 DIAGNOSIS — G8929 Other chronic pain: Secondary | ICD-10-CM

## 2018-03-11 DIAGNOSIS — M25561 Pain in right knee: Secondary | ICD-10-CM | POA: Insufficient documentation

## 2018-03-11 DIAGNOSIS — M542 Cervicalgia: Secondary | ICD-10-CM

## 2018-03-11 DIAGNOSIS — M25612 Stiffness of left shoulder, not elsewhere classified: Secondary | ICD-10-CM

## 2018-03-11 DIAGNOSIS — M6281 Muscle weakness (generalized): Secondary | ICD-10-CM | POA: Diagnosis not present

## 2018-03-11 NOTE — Therapy (Signed)
[facility] [address] [place], [place], [address] Phone: [phone]   Fax:  [phone]  Physical Therapy Treatment  Patient Details  Name: Molly Castaneda MRN: [id number] Date of Birth: November 30, 1960 Referring Provider (PT): Norlene Campbell, MD   Encounter Date: 03/11/2018  PT End of Session - 03/11/18 0951    Visit Number  17    Number of Visits  24    Date for PT Re-Evaluation  03/21/18    Authorization Type  Progress note every 10th visit.    PT Start Time  0900    PT Stop Time  0955    PT Time Calculation (min)  55 min       Past Medical History:  Diagnosis Date  . Arthritis   . Chronic back pain   . Chronic constipation   . Depression   . MVP (mitral valve prolapse)    had ablation 1998  . Numbness in left leg    s/p spinal surgery  . Supraventricular tachycardia (HCC)    ablation 1998  . Vitamin D deficiency     Past Surgical History:  Procedure Laterality Date  . AV NODE ABLATION  1998   for svt  . BACK SURGERY  2001   lumbar fusion  . PARTIAL HYSTERECTOMY  1993  . REPAIR EXTENSOR TENDON Left 03/06/2013   Procedure: LEFT LONG BOUTONNIERE REPAIR;  Surgeon: Wyn Forster., MD;  Location: [facility];  Service: Orthopedics;  Laterality: Left;  . SPINE SURGERY    . stimulation system implant  07/31/00   lumbar nerve stimulator-none funtioning now    There were no vitals filed for this visit.                    [facility] Adult PT Treatment/Exercise - 03/11/18 0001      Shoulder Exercises: Pulleys   Flexion  5 minutes    Other Pulley Exercises  standing UE ranger flexion x 5 mins      Modalities   Modalities  Electrical Stimulation;Vasopneumatic      Electrical Stimulation   Electrical Stimulation Location  L shoulder   IFC x 15 mins 80-150hz     Electrical Stimulation Goals  Pain      Vasopneumatic   Number Minutes Vasopneumatic   15 minutes    Vasopnuematic Location    Shoulder    Vasopneumatic Pressure  Low    Vasopneumatic Temperature   34      Manual Therapy   Manual Therapy  Passive ROM    Passive ROM  PROM of L shoulder into flexion, ER, IR with long  holds at end range               PT Short Term Goals - 02/25/18 0951      PT SHORT TERM GOAL #1   Title  Patient will be independent with initial HEP    Time  3    Period  Weeks    Status  Achieved      PT SHORT TERM GOAL #2   Title  Patient will demonstrate 90+ degrees of left shoulder flexion PROM.    Time  3    Period  Weeks    Status  Achieved      PT SHORT TERM GOAL #3   Title  Patient will demonstrate 20+ degrees of left shoulder ER PROM    Time  3    Period  Weeks  Status  Achieved        PT Long Term Goals - 02/25/18 0951      PT LONG TERM GOAL #1   Title  Patient will be independent with advanced HEP    Time  6    Period  Weeks    Status  On-going      PT LONG TERM GOAL #2   Title  Patient will demonstrate 120+ degrees of left shoulder flexion AROM to improve ability to perform functional tasks.    Time  6    Period  Weeks    Status  On-going      PT LONG TERM GOAL #3   Title  Patient will demonstrate 60+ degrees of left shoulder external rotation AROM to improve ability to don/doff apparel    Time  6    Period  Weeks    Status  On-going      PT LONG TERM GOAL #4   Title  Patient will demonstrate 4/5 or greater left shoulder MMT in all planes to improve stability during functional tasks.    Time  6    Period  Weeks    Status  On-going      PT LONG TERM GOAL #5   Title  Patient will report ability to perform ADLs with left shoulder pain less than or equal to 4/10.    Time  6    Period  Weeks    Status  On-going            Plan - 03/11/18 6415    Clinical Impression Statement  Pt arrived today doing about the same with pain and soreness in LT shldr. She was able to perform all AAROM exs with minimal pain increase, but moderate pain with  end-range PROM stretching. Smooth arc and soft end-feel.    Rehab Potential  Fair    Clinical Impairments Affecting Rehab Potential  chronic pain    PT Frequency  3x / week    PT Duration  6 weeks    PT Treatment/Interventions  Cryotherapy;ADLs/Self Care Home Management;Moist Heat;Therapeutic exercise;Therapeutic activities;Balance training;Neuromuscular re-education;Patient/family education;Manual techniques;Passive range of motion;Ultrasound;Electrical Stimulation    PT Next Visit Plan  Aggressive ROM per MD requests. Patient to return to MD in 3 weeks.    Consulted and Agree with Plan of Care  Patient       Patient will benefit from skilled therapeutic intervention in order to improve the following deficits and impairments:  Pain, Impaired UE functional use, Decreased activity tolerance, Decreased range of motion, Decreased strength, Postural dysfunction  Visit Diagnosis: Chronic left shoulder pain  Stiffness of left shoulder, not elsewhere classified  Muscle weakness (generalized)  Cervicalgia  Chronic pain of right knee     Problem List Patient Active Problem List   Diagnosis Date Noted  . Chronic left shoulder pain 09/18/2017  . Cervicalgia 09/18/2017  . Obesity (BMI 30.0-34.9) 05/20/2017  . Peripheral edema 09/07/2015  . Vitamin D deficiency 06/22/2014  . Depression 06/21/2014  . GAD (generalized anxiety disorder) 06/21/2014  . Back pain 06/21/2014  . Osteopenia 02/11/2013  . Constipation 05/08/2010    Deny Chevez,CHRIS, PTA 03/11/2018, 10:12 AM  [facility] [address] [place], [place], [address] Phone: [phone]   Fax:  [phone]  Name: DOMITA Castaneda MRN: [id number] Date of Birth: 09/15/1960

## 2018-03-12 ENCOUNTER — Ambulatory Visit: Payer: Medicare HMO | Admitting: Physician Assistant

## 2018-03-12 ENCOUNTER — Encounter: Payer: Medicare HMO | Admitting: Physical Therapy

## 2018-03-13 ENCOUNTER — Encounter: Payer: Self-pay | Admitting: Family

## 2018-03-13 ENCOUNTER — Ambulatory Visit (INDEPENDENT_AMBULATORY_CARE_PROVIDER_SITE_OTHER): Payer: Medicare HMO | Admitting: Family

## 2018-03-13 VITALS — BP 121/78 | HR 91 | Temp 97.0°F | Ht 72.0 in | Wt 225.0 lb

## 2018-03-13 DIAGNOSIS — J209 Acute bronchitis, unspecified: Secondary | ICD-10-CM | POA: Diagnosis not present

## 2018-03-13 DIAGNOSIS — J069 Acute upper respiratory infection, unspecified: Secondary | ICD-10-CM

## 2018-03-13 MED ORDER — PREDNISONE 10 MG (21) PO TBPK: ORAL_TABLET | ORAL | 0 refills | Status: DC

## 2018-03-13 MED ORDER — BENZONATATE 200 MG PO CAPS: 200.0000 mg | ORAL_CAPSULE | Freq: Three times a day (TID) | ORAL | 1 refills | Status: DC | PRN

## 2018-03-13 MED ORDER — FLUTICASONE PROPIONATE 50 MCG/ACT NA SUSP: 2.0000 | Freq: Every day | NASAL | 6 refills | Status: DC

## 2018-03-13 NOTE — Patient Instructions (Signed)

## 2018-03-13 NOTE — Progress Notes (Signed)
Subjective:    Patient ID: Molly Castaneda, female    DOB: 07-24-1960, 58 y.o.   MRN: [id number]  Chief Complaint  Patient presents with  . Cough and congestion    Cough  This is a recurrent problem. The current episode started 1 to 4 weeks ago. The problem has been waxing and waning. The problem occurs every few minutes. The cough is productive of sputum. Associated symptoms include headaches, nasal congestion, postnasal drip and a sore throat. Pertinent negatives include no chills, ear congestion, ear pain, fever, myalgias or shortness of breath. She has tried rest and OTC cough suppressant for the symptoms. The treatment provided mild relief.      Review of Systems  Constitutional: Negative for chills and fever.  HENT: Positive for postnasal drip and sore throat. Negative for ear pain.   Respiratory: Positive for cough. Negative for shortness of breath.   Musculoskeletal: Negative for myalgias.  Neurological: Positive for headaches.  All other systems reviewed and are negative.      Objective:   Physical Exam Vitals signs reviewed.  Constitutional:      General: She is not in acute distress.    Appearance: She is well-developed.  HENT:     Head: Normocephalic and atraumatic.     Nose: Mucosal edema and congestion present.     Mouth/Throat:     Pharynx: Posterior oropharyngeal erythema present.     Comments: Hoarseness  Eyes:     Pupils: Pupils are equal, round, and reactive to light.  Neck:     Musculoskeletal: Normal range of motion and neck supple.     Thyroid: No thyromegaly.  Cardiovascular:     Rate and Rhythm: Normal rate and regular rhythm.     Heart sounds: Normal heart sounds. No murmur.  Pulmonary:     Effort: Pulmonary effort is normal. No respiratory distress.     Breath sounds: Normal breath sounds. No wheezing.  Abdominal:     General: Bowel sounds are normal. There is no distension.     Palpations: Abdomen is soft.     Tenderness: There is no  abdominal tenderness.  Musculoskeletal: Normal range of motion.        General: No tenderness.  Skin:    General: Skin is warm and dry.  Neurological:     Mental Status: She is alert and oriented to person, place, and time.     Cranial Nerves: No cranial nerve deficit.     Deep Tendon Reflexes: Reflexes are normal and symmetric.  Psychiatric:        Behavior: Behavior normal.        Thought Content: Thought content normal.        Judgment: Judgment normal.       BP 121/78   Pulse 91   Temp (!) 97 F (36.1 C) (Oral)   Ht 6' (1.829 m)   Wt 225 lb (102.1 kg)   BMI 30.52 kg/m      Assessment & Plan:  Molly Castaneda comes in today with chief complaint of Cough and congestion   Diagnosis and orders addressed:  1. Viral URI - Take meds as prescribed - Use a cool mist humidifier  -Use saline nose sprays frequently -Force fluids -For any cough or congestion  Use plain Mucinex- regular strength or max strength is fine -For fever or aces or pains- take tylenol or ibuprofen. -Throat lozenges if help -New toothbrush in 3 days RTO if symptoms worsen or do not  improve - predniSONE (STERAPRED UNI-PAK 21 TAB) 10 MG (21) TBPK tablet; Use as directed  Dispense: 21 tablet; Refill: 0 - benzonatate (TESSALON) 200 MG capsule; Take 1 capsule (200 mg total) by mouth 3 (three) times daily as needed.  Dispense: 30 capsule; Refill: 1 - fluticasone (FLONASE) 50 MCG/ACT nasal spray; Place 2 sprays into both nostrils daily.  Dispense: 16 g; Refill: 6  2. Acute bronchitis, unspecified organism - predniSONE (STERAPRED UNI-PAK 21 TAB) 10 MG (21) TBPK tablet; Use as directed  Dispense: 21 tablet; Refill: 0 - benzonatate (TESSALON) 200 MG capsule; Take 1 capsule (200 mg total) by mouth 3 (three) times daily as needed.  Dispense: 30 capsule; Refill: 1 - fluticasone (FLONASE) 50 MCG/ACT nasal spray; Place 2 sprays into both nostrils daily.  Dispense: 16 g; Refill: 6   [name], FNP

## 2018-03-18 ENCOUNTER — Encounter: Payer: Self-pay | Admitting: Physical Therapy

## 2018-03-18 ENCOUNTER — Ambulatory Visit: Payer: Medicare HMO | Admitting: Physical Therapy

## 2018-03-18 DIAGNOSIS — M25561 Pain in right knee: Secondary | ICD-10-CM | POA: Diagnosis not present

## 2018-03-18 DIAGNOSIS — M542 Cervicalgia: Secondary | ICD-10-CM | POA: Diagnosis not present

## 2018-03-18 DIAGNOSIS — G8929 Other chronic pain: Secondary | ICD-10-CM

## 2018-03-18 DIAGNOSIS — M25512 Pain in left shoulder: Principal | ICD-10-CM

## 2018-03-18 DIAGNOSIS — M6281 Muscle weakness (generalized): Secondary | ICD-10-CM

## 2018-03-18 DIAGNOSIS — M25612 Stiffness of left shoulder, not elsewhere classified: Secondary | ICD-10-CM | POA: Diagnosis not present

## 2018-03-18 NOTE — Therapy (Signed)
[facility] [place], [place], [address] Phone: [phone]   Fax:  [phone]  Physical Therapy Treatment  Patient Details  Name: Molly Castaneda MRN: [id number] Date of Birth: 1960-06-28 Referring Provider (PT): Joni Fears, MD   Encounter Date: 03/18/2018  PT End of Session - 03/18/18 0906    Visit Number  18    Number of Visits  24    Date for PT Re-Evaluation  03/21/18    Authorization Type  Progress note every 10th visit.    PT Start Time  0902    PT Stop Time  0951    PT Time Calculation (min)  49 min    Activity Tolerance  Patient tolerated treatment well    Behavior During Therapy  [facility] for tasks assessed/performed       Past Medical History:  Diagnosis Date  . Arthritis   . Chronic back pain   . Chronic constipation   . Depression   . MVP (mitral valve prolapse)    had ablation 1998  . Numbness in left leg    s/p spinal surgery  . Supraventricular tachycardia ([place])    ablation 1998  . Vitamin D deficiency     Past Surgical History:  Procedure Laterality Date  . [place]   for svt  . BACK SURGERY  2001   lumbar fusion  . PARTIAL HYSTERECTOMY  1993  . REPAIR EXTENSOR TENDON Left 03/06/2013   Procedure: LEFT LONG BOUTONNIERE REPAIR;  Surgeon: Cammie Sickle., MD;  Location: [place];  Service: Orthopedics;  Laterality: Left;  . SPINE SURGERY    . stimulation system implant  07/31/00   lumbar nerve stimulator-none funtioning now    There were no vitals filed for this visit.  Subjective Assessment - 03/18/18 0904    Subjective  Reports that MD visit tomorrow.    Patient is accompained by:  Family member   Husband   Pertinent History  left RTC Repair 12/26/17; chronic pain, depression, internal stimulator but "dead"    Limitations  House hold activities;Lifting    Diagnostic tests  X-Ray normal    Patient Stated Goals  less pain, use my arm better     Currently in Pain?  Yes    Pain Score  3     Pain Location  Shoulder    Pain Orientation  Left    Pain Descriptors / Indicators  Discomfort    Pain Type  Surgical pain    Pain Onset  More than a month ago         [facility] PT Assessment - 03/18/18 0001      Assessment   Medical Diagnosis  Chronic left shoulder pain, S/P left RTC repair    Referring Provider (PT)  Joni Fears, MD    Onset Date/Surgical Date  12/26/17    Hand Dominance  Right    Next MD Visit  03/19/2018    Prior Therapy  yes      Precautions   Precautions  Shoulder    Type of Shoulder Precautions  rotator cuff repair      ROM / Strength   AROM / PROM / Strength  AROM      AROM   Overall AROM   Deficits;Within functional limits for tasks performed    AROM Assessment Site  Shoulder    Right/Left Shoulder  Left    Left Shoulder Flexion  150 Degrees   in  supine; 76 deg in standing   Left Shoulder Internal Rotation  70 Degrees    Left Shoulder External Rotation  55 Degrees                   [facility] Adult PT Treatment/Exercise - 03/18/18 0001      Shoulder Exercises: Supine   External Rotation  AAROM;Left;20 reps    Flexion  AAROM;Both;20 reps      Shoulder Exercises: Pulleys   Flexion  5 minutes    Other Pulley Exercises  standing UE ranger flexion x 5 mins      Modalities   Modalities  Electrical Stimulation;Vasopneumatic      Electrical Stimulation   Electrical Stimulation Location  L shoulder    Electrical Stimulation Action  IFC    Electrical Stimulation Parameters  80-150 hz x15 min    Electrical Stimulation Goals  Pain      Vasopneumatic   Number Minutes Vasopneumatic   15 minutes    Vasopnuematic Location   Shoulder    Vasopneumatic Pressure  Low    Vasopneumatic Temperature   34      Manual Therapy   Manual Therapy  Passive ROM    Passive ROM  PROM of L shoulder into flexion, ER, IR with long  holds at end range               PT Short Term Goals - 02/25/18 0951       PT SHORT TERM GOAL #1   Title  Patient will be independent with initial HEP    Time  3    Period  Weeks    Status  Achieved      PT SHORT TERM GOAL #2   Title  Patient will demonstrate 90+ degrees of left shoulder flexion PROM.    Time  3    Period  Weeks    Status  Achieved      PT SHORT TERM GOAL #3   Title  Patient will demonstrate 20+ degrees of left shoulder ER PROM    Time  3    Period  Weeks    Status  Achieved        PT Long Term Goals - 03/18/18 2725      PT LONG TERM GOAL #1   Title  Patient will be independent with advanced HEP    Time  6    Period  Weeks    Status  On-going      PT LONG TERM GOAL #2   Title  Patient will demonstrate 120+ degrees of left shoulder flexion AROM to improve ability to perform functional tasks.    Time  6    Period  Weeks    Status  Partially Met   in supine     PT LONG TERM GOAL #3   Title  Patient will demonstrate 60+ degrees of left shoulder external rotation AROM to improve ability to don/doff apparel    Time  6    Period  Weeks    Status  Partially Met      PT LONG TERM GOAL #4   Title  Patient will demonstrate 4/5 or greater left shoulder MMT in all planes to improve stability during functional tasks.    Time  6    Period  Weeks    Status  On-going      PT LONG TERM GOAL #5   Title  Patient will report ability to perform ADLs with left  shoulder pain less than or equal to 4/10.    Time  6    Period  Weeks    Status  On-going            Plan - 03/18/18 6144    Clinical Impression Statement  Patient arrived in clinic with reports of low level L shoulder discomfort after medication. Patient and PT staff have been diligent in improving ROM while in clinic. Patient able to complete AAROM against gravity without complaint of pain only muscle fatigue. Patient continues to require VCs and tactile cues to improve ER technique. Patient's AROM L shoulder measurements have improved with the ROM focus in supine. Firm  end feels and smooth arc of motion noted during PROM session in all directions. Normal modalities response noted following removal of the modalities.    Rehab Potential  Fair    Clinical Impairments Affecting Rehab Potential  chronic pain    PT Frequency  3x / week    PT Duration  6 weeks    PT Treatment/Interventions  Cryotherapy;ADLs/Self Care Home Management;Moist Heat;Therapeutic exercise;Therapeutic activities;Balance training;Neuromuscular re-education;Patient/family education;Manual techniques;Passive range of motion;Ultrasound;Electrical Stimulation    PT Next Visit Plan  Aggressive ROM per MD requests. Patient to return to MD in 3 weeks.    Consulted and Agree with Plan of Care  Patient       Patient will benefit from skilled therapeutic intervention in order to improve the following deficits and impairments:  Pain, Impaired UE functional use, Decreased activity tolerance, Decreased range of motion, Decreased strength, Postural dysfunction  Visit Diagnosis: Chronic left shoulder pain  Stiffness of left shoulder, not elsewhere classified  Muscle weakness (generalized)     Problem List Patient Active Problem List   Diagnosis Date Noted  . Chronic left shoulder pain 09/18/2017  . Cervicalgia 09/18/2017  . Obesity (BMI 30.0-34.9) 05/20/2017  . Peripheral edema 09/07/2015  . Vitamin D deficiency 06/22/2014  . Depression 06/21/2014  . GAD (generalized anxiety disorder) 06/21/2014  . Back pain 06/21/2014  . Osteopenia 02/11/2013  . Constipation 05/08/2010    Standley Brooking, PTA 03/18/2018, 9:57 AM  [facility] [address] [place], [place], [address] Phone: [phone]   Fax:  [phone]  Name: DILANA MCPHIE MRN: [id number] Date of Birth: 1960-12-14

## 2018-03-19 ENCOUNTER — Ambulatory Visit (INDEPENDENT_AMBULATORY_CARE_PROVIDER_SITE_OTHER): Payer: Medicare HMO | Admitting: Orthopaedic Surgery

## 2018-03-19 ENCOUNTER — Encounter (INDEPENDENT_AMBULATORY_CARE_PROVIDER_SITE_OTHER): Payer: Self-pay | Admitting: Orthopaedic Surgery

## 2018-03-19 VITALS — BP 133/73 | HR 84 | Ht 72.0 in | Wt 225.0 lb

## 2018-03-19 DIAGNOSIS — G8929 Other chronic pain: Secondary | ICD-10-CM

## 2018-03-19 DIAGNOSIS — M25512 Pain in left shoulder: Secondary | ICD-10-CM

## 2018-03-19 DIAGNOSIS — Z4789 Encounter for other orthopedic aftercare: Secondary | ICD-10-CM

## 2018-03-19 DIAGNOSIS — Z9889 Other specified postprocedural states: Secondary | ICD-10-CM

## 2018-03-19 NOTE — Progress Notes (Signed)
Office Visit Note   Patient: Molly Castaneda           Date of Birth: 02/06/1960           MRN: [id number] Visit Date: 03/19/2018              Requested by: [name], FNP [address] [place], [place] [address] PCP: [name], FNP   Assessment & Plan: Visit Diagnoses:  1. Chronic left shoulder pain     Plan: Approaching 3 months status post rotator cuff tear repair of left shoulder has had significant increase in motion following continued physical therapy.  Will urged her to continue with a home exercise program and therapy and check her again in a month.  Certainly making good progress  Follow-Up Instructions: No follow-ups on file.   Orders:  No orders of the defined types were placed in this encounter.  No orders of the defined types were placed in this encounter.     Procedures: No procedures performed   Clinical Data: No additional findings.   Subjective: Chief Complaint  Patient presents with  . Left Shoulder - Follow-up    DOS 12/26/17  Patient presents for follow up for her left shoulder. She is status post left shoulder arthroscopy, SAD, DCE, Mini open rotator cuff repair on 12/26/17. She went to therapy yesterday. She is unable to raise her arm while sitting, and cannot get her arm over her head while lying down.  Has had chronic pain syndrome for years and was taking morphine prior to surgery  HPI  Review of Systems   Objective: Vital Signs: BP 133/73   Pulse 84   Ht 6' (1.829 m)   Wt 225 lb (102.1 kg)   BMI 30.52 kg/m   Physical Exam  Ortho Exam left shoulder with just about full overhead passive flexion and at least 90 degrees of abduction.  Quite weak and unable to maintain that position.  Good grip and good release.  No impingement Specialty Comments:  No specialty comments available.  Imaging: No results found.   PMFS History: Patient Active Problem List   Diagnosis Date Noted  . Chronic left shoulder pain  09/18/2017  . Cervicalgia 09/18/2017  . Obesity (BMI 30.0-34.9) 05/20/2017  . Peripheral edema 09/07/2015  . Vitamin D deficiency 06/22/2014  . Depression 06/21/2014  . GAD (generalized anxiety disorder) 06/21/2014  . Back pain 06/21/2014  . Osteopenia 02/11/2013  . Constipation 05/08/2010   Past Medical History:  Diagnosis Date  . Arthritis   . Chronic back pain   . Chronic constipation   . Depression   . MVP (mitral valve prolapse)    had ablation 1998  . Numbness in left leg    s/p spinal surgery  . Supraventricular tachycardia ([facility])    ablation 1998  . Vitamin D deficiency     Family History  Problem Relation Age of Onset  . Coronary artery disease Father   . Colon polyps Father   . Prostate cancer Maternal Grandfather   . Hodgkin's lymphoma Son   . Liver cancer Paternal Uncle        ?  Marland Kitchen Arthritis Mother   . Colon cancer Neg Hx     Past Surgical History:  Procedure Laterality Date  . AV NODE ABLATION  1998   for svt  . BACK SURGERY  2001   lumbar fusion  . PARTIAL HYSTERECTOMY  1993  . REPAIR EXTENSOR TENDON Left 03/06/2013  Procedure: LEFT LONG BOUTONNIERE REPAIR;  Surgeon: Wyn Forster., MD;  Location: [facility];  Service: Orthopedics;  Laterality: Left;  . SPINE SURGERY    . stimulation system implant  07/31/00   lumbar nerve stimulator-none funtioning now   Social History   Occupational History  . Occupation: disabled    Associate Professor: UNEMPLOYED  Tobacco Use  . Smoking status: Never Smoker  . Smokeless tobacco: Never Used  Substance and Sexual Activity  . Alcohol use: No  . Drug use: No  . Sexual activity: Never

## 2018-03-20 ENCOUNTER — Encounter: Payer: Self-pay | Admitting: Physical Therapy

## 2018-03-20 ENCOUNTER — Ambulatory Visit: Payer: Medicare HMO | Admitting: Physical Therapy

## 2018-03-20 DIAGNOSIS — M6281 Muscle weakness (generalized): Secondary | ICD-10-CM | POA: Diagnosis not present

## 2018-03-20 DIAGNOSIS — G8929 Other chronic pain: Secondary | ICD-10-CM | POA: Diagnosis not present

## 2018-03-20 DIAGNOSIS — M25512 Pain in left shoulder: Secondary | ICD-10-CM | POA: Diagnosis not present

## 2018-03-20 DIAGNOSIS — M25561 Pain in right knee: Secondary | ICD-10-CM | POA: Diagnosis not present

## 2018-03-20 DIAGNOSIS — R69 Illness, unspecified: Secondary | ICD-10-CM | POA: Diagnosis not present

## 2018-03-20 DIAGNOSIS — M542 Cervicalgia: Secondary | ICD-10-CM | POA: Diagnosis not present

## 2018-03-20 DIAGNOSIS — M25612 Stiffness of left shoulder, not elsewhere classified: Secondary | ICD-10-CM | POA: Diagnosis not present

## 2018-03-20 NOTE — Therapy (Signed)
[facility] [place], [place], [address] Phone: [phone]   Fax:  [phone]  Physical Therapy Treatment  Patient Details  Name: Molly Castaneda MRN: [id number] Date of Birth: 17-Feb-1960 Referring Provider (PT): Joni Fears, MD   Encounter Date: 03/20/2018  PT End of Session - 03/20/18 0904    Visit Number  19    Number of Visits  24    Date for PT Re-Evaluation  03/21/18    Authorization Type  Progress note every 10th visit.    PT Start Time  0902    PT Stop Time  0950    PT Time Calculation (min)  48 min    Activity Tolerance  Patient tolerated treatment well    Behavior During Therapy  [facility] for tasks assessed/performed       Past Medical History:  Diagnosis Date  . Arthritis   . Chronic back pain   . Chronic constipation   . Depression   . MVP (mitral valve prolapse)    had ablation 1998  . Numbness in left leg    s/p spinal surgery  . Supraventricular tachycardia ([place])    ablation 1998  . Vitamin D deficiency     Past Surgical History:  Procedure Laterality Date  . [place]   for svt  . BACK SURGERY  2001   lumbar fusion  . PARTIAL HYSTERECTOMY  1993  . REPAIR EXTENSOR TENDON Left 03/06/2013   Procedure: LEFT LONG BOUTONNIERE REPAIR;  Surgeon: Cammie Sickle., MD;  Location: [place];  Service: Orthopedics;  Laterality: Left;  . SPINE SURGERY    . stimulation system implant  07/31/00   lumbar nerve stimulator-none funtioning now    There were no vitals filed for this visit.  Subjective Assessment - 03/20/18 0903    Subjective  Reports that MD was pleased and seen a great amount of improvement.    Patient is accompained by:  Family member   Husband   Pertinent History  left RTC Repair 12/26/17; chronic pain, depression, internal stimulator but "dead"    Limitations  House hold activities;Lifting    Diagnostic tests  X-Ray normal    Patient Stated Goals  less  pain, use my arm better    Currently in Pain?  Yes    Pain Score  5     Pain Location  Shoulder    Pain Orientation  Left    Pain Descriptors / Indicators  Discomfort    Pain Type  Surgical pain    Pain Onset  More than a month ago         [facility] PT Assessment - 03/20/18 0001      Assessment   Medical Diagnosis  Chronic left shoulder pain, S/P left RTC repair    Referring Provider (PT)  Joni Fears, MD    Onset Date/Surgical Date  12/26/17    Hand Dominance  Right    Next MD Visit  04/16/2018    Prior Therapy  yes      Precautions   Precautions  Shoulder    Type of Shoulder Precautions  rotator cuff repair                   [facility] Adult PT Treatment/Exercise - 03/20/18 0001      Shoulder Exercises: Supine   Protraction  AROM;Left;20 reps    Flexion  AROM;Left;20 reps      Shoulder Exercises: Sidelying  External Rotation  AROM;Left;20 reps    Flexion  AROM;Left;20 reps      Shoulder Exercises: Standing   Other Standing Exercises  Wall slides x15 reps      Shoulder Exercises: Pulleys   Flexion  5 minutes      Shoulder Exercises: ROM/Strengthening   UBE (Upper Arm Bike)  120 RPM x4 min      Modalities   Modalities  Electrical Stimulation;Vasopneumatic      Electrical Stimulation   Electrical Stimulation Location  L shoulder    Electrical Stimulation Action  IFC    Electrical Stimulation Parameters  80-150 hz x15 min    Electrical Stimulation Goals  Pain      Vasopneumatic   Number Minutes Vasopneumatic   15 minutes    Vasopnuematic Location   Shoulder    Vasopneumatic Pressure  Low    Vasopneumatic Temperature   55      Manual Therapy   Manual Therapy  Passive ROM    Passive ROM  PROM of L shoulder into flexion, ER, IR with long  holds at end range             PT Education - 03/20/18 0943    Education Details  HEP- supine AROM flexion, SL AROM ER, wall slides     Person(s) Educated  Patient;Spouse    Methods   Explanation;Demonstration;Handout    Comprehension  Verbalized understanding       PT Short Term Goals - 02/25/18 0951      PT SHORT TERM GOAL #1   Title  Patient will be independent with initial HEP    Time  3    Period  Weeks    Status  Achieved      PT SHORT TERM GOAL #2   Title  Patient will demonstrate 90+ degrees of left shoulder flexion PROM.    Time  3    Period  Weeks    Status  Achieved      PT SHORT TERM GOAL #3   Title  Patient will demonstrate 20+ degrees of left shoulder ER PROM    Time  3    Period  Weeks    Status  Achieved        PT Long Term Goals - 03/18/18 4734      PT LONG TERM GOAL #1   Title  Patient will be independent with advanced HEP    Time  6    Period  Weeks    Status  On-going      PT LONG TERM GOAL #2   Title  Patient will demonstrate 120+ degrees of left shoulder flexion AROM to improve ability to perform functional tasks.    Time  6    Period  Weeks    Status  Partially Met   in supine     PT LONG TERM GOAL #3   Title  Patient will demonstrate 60+ degrees of left shoulder external rotation AROM to improve ability to don/doff apparel    Time  6    Period  Weeks    Status  Partially Met      PT LONG TERM GOAL #4   Title  Patient will demonstrate 4/5 or greater left shoulder MMT in all planes to improve stability during functional tasks.    Time  6    Period  Weeks    Status  On-going      PT LONG TERM GOAL #5   Title  Patient will report ability to perform ADLs with left shoulder pain less than or equal to 4/10.    Time  6    Period  Weeks    Status  On-going            Plan - 03/20/18 0943    Clinical Impression Statement  Patient presented in clinic in good spirits after last MD visit. Patient able to complete antigravity exercises better today in various positions with reports of fatigue especially in SL L shoulder flexion. Patient provided new HEP for AROM exercises and instructed in parameters and ensured  technique. Firm end feels and smooth arc of motion noted with PROM of R shoulder in all directions. Normal modalities response noted following removal of the modalities.    Rehab Potential  Fair    Clinical Impairments Affecting Rehab Potential  chronic pain    PT Frequency  3x / week    PT Duration  6 weeks    PT Treatment/Interventions  Cryotherapy;ADLs/Self Care Home Management;Moist Heat;Therapeutic exercise;Therapeutic activities;Balance training;Neuromuscular re-education;Patient/family education;Manual techniques;Passive range of motion;Ultrasound;Electrical Stimulation    PT Next Visit Plan  Continue shoulder strengthening and continue antigravity ROM per POC.    Consulted and Agree with Plan of Care  Patient       Patient will benefit from skilled therapeutic intervention in order to improve the following deficits and impairments:  Pain, Impaired UE functional use, Decreased activity tolerance, Decreased range of motion, Decreased strength, Postural dysfunction  Visit Diagnosis: Chronic left shoulder pain  Stiffness of left shoulder, not elsewhere classified  Muscle weakness (generalized)     Problem List Patient Active Problem List   Diagnosis Date Noted  . Chronic left shoulder pain 09/18/2017  . Cervicalgia 09/18/2017  . Obesity (BMI 30.0-34.9) 05/20/2017  . Peripheral edema 09/07/2015  . Vitamin D deficiency 06/22/2014  . Depression 06/21/2014  . GAD (generalized anxiety disorder) 06/21/2014  . Back pain 06/21/2014  . Osteopenia 02/11/2013  . Constipation 05/08/2010    Standley Brooking, PTA 03/20/2018, 10:06 AM  [facility] [address] [place], [place], [address] Phone: [phone]   Fax:  [phone]  Name: LIZZETE GOUGH MRN: [id number] Date of Birth: [birth year]

## 2018-03-25 ENCOUNTER — Encounter: Payer: Self-pay | Admitting: Physical Therapy

## 2018-03-25 ENCOUNTER — Ambulatory Visit: Payer: Medicare HMO | Admitting: Physical Therapy

## 2018-03-25 DIAGNOSIS — G8929 Other chronic pain: Secondary | ICD-10-CM

## 2018-03-25 DIAGNOSIS — M6281 Muscle weakness (generalized): Secondary | ICD-10-CM | POA: Diagnosis not present

## 2018-03-25 DIAGNOSIS — M25561 Pain in right knee: Secondary | ICD-10-CM | POA: Diagnosis not present

## 2018-03-25 DIAGNOSIS — M25612 Stiffness of left shoulder, not elsewhere classified: Secondary | ICD-10-CM

## 2018-03-25 DIAGNOSIS — M25512 Pain in left shoulder: Secondary | ICD-10-CM | POA: Diagnosis not present

## 2018-03-25 DIAGNOSIS — M542 Cervicalgia: Secondary | ICD-10-CM | POA: Diagnosis not present

## 2018-03-25 NOTE — Therapy (Signed)
[facility] [place], [place], [address] Phone: [phone]   Fax:  [phone]  Physical Therapy Treatment  Patient Details  Name: Molly Castaneda MRN: [id number] Date of Birth: Dec 03, 1960 Referring Provider (PT): Joni Fears, MD   Encounter Date: 03/25/2018  PT End of Session - 03/25/18 0905    Visit Number  20    Number of Visits  30    Date for PT Re-Evaluation  05/02/18    Authorization Type  Progress note every 10th visit.    PT Start Time  0902    PT Stop Time  0958    PT Time Calculation (min)  56 min    Activity Tolerance  Patient tolerated treatment well    Behavior During Therapy  [facility] for tasks assessed/performed       Past Medical History:  Diagnosis Date  . Arthritis   . Chronic back pain   . Chronic constipation   . Depression   . MVP (mitral valve prolapse)    had ablation 1998  . Numbness in left leg    s/p spinal surgery  . Supraventricular tachycardia ([place])    ablation 1998  . Vitamin D deficiency     Past Surgical History:  Procedure Laterality Date  . [place]   for svt  . BACK SURGERY  2001   lumbar fusion  . PARTIAL HYSTERECTOMY  1993  . REPAIR EXTENSOR TENDON Left 03/06/2013   Procedure: LEFT LONG BOUTONNIERE REPAIR;  Surgeon: Cammie Sickle., MD;  Location: [place];  Service: Orthopedics;  Laterality: Left;  . SPINE SURGERY    . stimulation system implant  07/31/00   lumbar nerve stimulator-none funtioning now    There were no vitals filed for this visit.  Subjective Assessment - 03/25/18 0904    Subjective  No excpetional pain following last treatment. Tried playing with a 79 mo old baby that is "chunky" on Sunday.    Pertinent History  left RTC Repair 12/26/17; chronic pain, depression, internal stimulator but "dead"    Limitations  House hold activities;Lifting    Diagnostic tests  X-Ray normal    Patient Stated Goals  less pain, use my  arm better    Currently in Pain?  Yes    Pain Score  4     Pain Location  Shoulder    Pain Orientation  Left    Pain Descriptors / Indicators  Discomfort    Pain Type  Surgical pain    Pain Onset  More than a month ago         [facility] PT Assessment - 03/25/18 0001      Assessment   Medical Diagnosis  Chronic left shoulder pain, S/P left RTC repair    Referring Provider (PT)  Joni Fears, MD    Onset Date/Surgical Date  12/26/17    Hand Dominance  Right    Next MD Visit  04/16/2018    Prior Therapy  yes      Precautions   Precautions  Shoulder    Type of Shoulder Precautions  rotator cuff repair                   [facility] Adult PT Treatment/Exercise - 03/25/18 0001      Shoulder Exercises: Supine   Protraction  AROM;Left;20 reps    Flexion  AROM;Left;20 reps      Shoulder Exercises: Standing   Other Standing Exercises  Wall slides x15 reps      Shoulder Exercises: Pulleys   Flexion  5 minutes      Shoulder Exercises: ROM/Strengthening   UBE (Upper Arm Bike)  120 RPM x6 min      Modalities   Modalities  Electrical Stimulation;Vasopneumatic      Electrical Stimulation   Electrical Stimulation Location  L shoulder    Electrical Stimulation Action  IFC    Electrical Stimulation Parameters  80-150 hz x15 min    Electrical Stimulation Goals  Pain      Vasopneumatic   Number Minutes Vasopneumatic   15 minutes    Vasopnuematic Location   Shoulder    Vasopneumatic Pressure  Low    Vasopneumatic Temperature   52      Manual Therapy   Manual Therapy  Passive ROM    Passive ROM  PROM of L shoulder into flexion, ER, IR with long  holds at end range               PT Short Term Goals - 02/25/18 0951      PT SHORT TERM GOAL #1   Title  Patient will be independent with initial HEP    Time  3    Period  Weeks    Status  Achieved      PT SHORT TERM GOAL #2   Title  Patient will demonstrate 90+ degrees of left shoulder flexion PROM.    Time  3     Period  Weeks    Status  Achieved      PT SHORT TERM GOAL #3   Title  Patient will demonstrate 20+ degrees of left shoulder ER PROM    Time  3    Period  Weeks    Status  Achieved        PT Long Term Goals - 03/25/18 0945      PT LONG TERM GOAL #1   Title  Patient will be independent with advanced HEP    Time  6    Period  Weeks    Status  Achieved      PT LONG TERM GOAL #2   Title  Patient will demonstrate 120+ degrees of left shoulder flexion AROM to improve ability to perform functional tasks.    Time  6    Period  Weeks    Status  Partially Met   in supine     PT LONG TERM GOAL #3   Title  Patient will demonstrate 60+ degrees of left shoulder external rotation AROM to improve ability to don/doff apparel    Time  6    Period  Weeks    Status  Partially Met      PT LONG TERM GOAL #4   Title  Patient will demonstrate 4/5 or greater left shoulder MMT in all planes to improve stability during functional tasks.    Time  6    Period  Weeks    Status  On-going      PT LONG TERM GOAL #5   Title  Patient will report ability to perform ADLs with left shoulder pain less than or equal to 4/10.    Time  6    Period  Weeks    Status  On-going            Plan - 03/25/18 0940    Clinical Impression Statement  Patient presented in clinic with a few more reports of discomfort of L shoulder  during therex. Rest breaks required during wall slides especially secondary to weakness and fatigue. Patient encouraged that a manipulation not required and further educated her that strengthening would help with her current limitations. Firm end feels and smooth arc of motion noted with PROM of R shoulder. No complaints of discomfort during PROM session. Normal modalities response noted following removal of the modalities.    Rehab Potential  Fair    Clinical Impairments Affecting Rehab Potential  chronic pain    PT Frequency  3x / week    PT Duration  6 weeks    PT  Treatment/Interventions  Cryotherapy;ADLs/Self Care Home Management;Moist Heat;Therapeutic exercise;Therapeutic activities;Balance training;Neuromuscular re-education;Patient/family education;Manual techniques;Passive range of motion;Ultrasound;Electrical Stimulation    PT Next Visit Plan  Continue shoulder strengthening and continue antigravity ROM per POC.    Consulted and Agree with Plan of Care  Patient       Patient will benefit from skilled therapeutic intervention in order to improve the following deficits and impairments:  Pain, Impaired UE functional use, Decreased activity tolerance, Decreased range of motion, Decreased strength, Postural dysfunction  Visit Diagnosis: Chronic left shoulder pain - Plan: PT plan of care cert/re-cert  Stiffness of left shoulder, not elsewhere classified - Plan: PT plan of care cert/re-cert  Muscle weakness (generalized) - Plan: PT plan of care cert/re-cert     Problem List Patient Active Problem List   Diagnosis Date Noted  . Chronic left shoulder pain 09/18/2017  . Cervicalgia 09/18/2017  . Obesity (BMI 30.0-34.9) 05/20/2017  . Peripheral edema 09/07/2015  . Vitamin D deficiency 06/22/2014  . Depression 06/21/2014  . GAD (generalized anxiety disorder) 06/21/2014  . Back pain 06/21/2014  . Osteopenia 02/11/2013  . Constipation 05/08/2010   Standley Brooking, PTA 03/25/2018, 4:29 PM  [facility] [address] [place], [place], [address] Phone: [phone]   Fax:  [phone]  Name: Molly Castaneda MRN: [id number] Date of Birth: 11/17/1960

## 2018-03-27 ENCOUNTER — Ambulatory Visit: Payer: Medicare HMO | Admitting: *Deleted

## 2018-03-27 DIAGNOSIS — M25561 Pain in right knee: Secondary | ICD-10-CM | POA: Diagnosis not present

## 2018-03-27 DIAGNOSIS — M6281 Muscle weakness (generalized): Secondary | ICD-10-CM | POA: Diagnosis not present

## 2018-03-27 DIAGNOSIS — M25612 Stiffness of left shoulder, not elsewhere classified: Secondary | ICD-10-CM | POA: Diagnosis not present

## 2018-03-27 DIAGNOSIS — M25512 Pain in left shoulder: Secondary | ICD-10-CM | POA: Diagnosis not present

## 2018-03-27 DIAGNOSIS — M542 Cervicalgia: Secondary | ICD-10-CM | POA: Diagnosis not present

## 2018-03-27 DIAGNOSIS — G8929 Other chronic pain: Secondary | ICD-10-CM

## 2018-03-27 NOTE — Therapy (Signed)
[facility] [place], [place], [address] Phone: [phone]   Fax:  [phone]  Physical Therapy Treatment  Patient Details  Name: Molly Castaneda MRN: [id number] Date of Birth: Dec 11, 1960 Referring Provider (PT): Joni Fears, MD   Encounter Date: 03/27/2018  PT End of Session - 03/27/18 0856    Visit Number  21    Number of Visits  30    Date for PT Re-Evaluation  05/02/18    Authorization Type  Progress note every 10th visit.    PT Start Time  0855    PT Stop Time  [phone]    PT Time Calculation (min)  57 min       Past Medical History:  Diagnosis Date  . Arthritis   . Chronic back pain   . Chronic constipation   . Depression   . MVP (mitral valve prolapse)    had ablation 1998  . Numbness in left leg    s/p spinal surgery  . Supraventricular tachycardia ([place])    ablation 1998  . Vitamin D deficiency     Past Surgical History:  Procedure Laterality Date  . [place]   for svt  . BACK SURGERY  2001   lumbar fusion  . PARTIAL HYSTERECTOMY  1993  . REPAIR EXTENSOR TENDON Left 03/06/2013   Procedure: LEFT LONG BOUTONNIERE REPAIR;  Surgeon: Cammie Sickle., MD;  Location: [place];  Service: Orthopedics;  Laterality: Left;  . SPINE SURGERY    . stimulation system implant  07/31/00   lumbar nerve stimulator-none funtioning now    There were no vitals filed for this visit.  Subjective Assessment - 03/27/18 0842    Subjective  I still can't lift my LT arm    Patient is accompained by:  Family member    Pertinent History  left RTC Repair 12/26/17; chronic pain, depression, internal stimulator but "dead"    Limitations  House hold activities;Lifting    Diagnostic tests  X-Ray normal    Patient Stated Goals  less pain, use my arm better    Currently in Pain?  Yes    Pain Score  4     Pain Location  Shoulder    Pain Descriptors / Indicators  Discomfort    Pain Type   Surgical pain    Pain Onset  More than a month ago    Pain Frequency  Constant                       [facility] Adult PT Treatment/Exercise - 03/27/18 0001      Shoulder Exercises: Supine   Protraction  AROM;Left;20 reps    Flexion  AROM;Left;20 reps    Other Supine Exercises  CW/CCW circles each way 2x10 each      Shoulder Exercises: Sidelying   External Rotation  AROM;Left;20 reps    Flexion  AROM;Left;5 reps   still very challenging     Shoulder Exercises: Standing   External Rotation  Strengthening;Left;20 reps    Theraband Level (Shoulder External Rotation)  Level 1 (Yellow)    Internal Rotation  Strengthening;Left;20 reps    Theraband Level (Shoulder Internal Rotation)  Level 1 (Yellow)    Extension  Strengthening;Left;20 reps    Theraband Level (Shoulder Extension)  Level 1 (Yellow)    Row  Strengthening;Left;20 reps    Theraband Level (Shoulder Row)  Level 1 (Yellow)      Shoulder  Exercises: Pulleys   Flexion  5 minutes      Shoulder Exercises: ROM/Strengthening   UBE (Upper Arm Bike)  120 RPM x6 min      Modalities   Modalities  Electrical Stimulation;Vasopneumatic      Electrical Stimulation   Electrical Stimulation Location  L shoulder    Electrical Stimulation Action  IFC    Electrical Stimulation Parameters  80-_0   x 15 mins    Electrical Stimulation Goals  Pain      Vasopneumatic   Number Minutes Vasopneumatic   15 minutes    Vasopnuematic Location   Shoulder    Vasopneumatic Pressure  Low    Vasopneumatic Temperature   36      Manual Therapy   Manual Therapy  Passive ROM    Manual therapy comments  Rhytmic stab for flexion at 100 degrees in supine               PT Short Term Goals - 02/25/18 0951      PT SHORT TERM GOAL #1   Title  Patient will be independent with initial HEP    Time  3    Period  Weeks    Status  Achieved      PT SHORT TERM GOAL #2   Title  Patient will demonstrate 90+ degrees of left shoulder  flexion PROM.    Time  3    Period  Weeks    Status  Achieved      PT SHORT TERM GOAL #3   Title  Patient will demonstrate 20+ degrees of left shoulder ER PROM    Time  3    Period  Weeks    Status  Achieved        PT Long Term Goals - 03/25/18 0945      PT LONG TERM GOAL #1   Title  Patient will be independent with advanced HEP    Time  6    Period  Weeks    Status  Achieved      PT LONG TERM GOAL #2   Title  Patient will demonstrate 120+ degrees of left shoulder flexion AROM to improve ability to perform functional tasks.    Time  6    Period  Weeks    Status  Partially Met   in supine     PT LONG TERM GOAL #3   Title  Patient will demonstrate 60+ degrees of left shoulder external rotation AROM to improve ability to don/doff apparel    Time  6    Period  Weeks    Status  Partially Met      PT LONG TERM GOAL #4   Title  Patient will demonstrate 4/5 or greater left shoulder MMT in all planes to improve stability during functional tasks.    Time  6    Period  Weeks    Status  On-going      PT LONG TERM GOAL #5   Title  Patient will report ability to perform ADLs with left shoulder pain less than or equal to 4/10.    Time  6    Period  Weeks    Status  On-going            Plan - 03/27/18 0737    Clinical Impression Statement  Pt arrived today doing about the same with LT shldr. Her CC is not being able to LT her arm yet. Rx focused on mm activation and supine  and sidelying AROM exs. Pt was able to elevate LT arm in supine and RT sidelying, but sidelying was challenging. Pt was issued RW4  with yellow tband for HEP. Normal modality response .    Clinical Presentation  Stable    Clinical Decision Making  Moderate    Rehab Potential  Fair    Clinical Impairments Affecting Rehab Potential  chronic pain    PT Frequency  3x / week    PT Duration  6 weeks    PT Treatment/Interventions  Cryotherapy;ADLs/Self Care Home Management;Moist Heat;Therapeutic  exercise;Therapeutic activities;Balance training;Neuromuscular re-education;Patient/family education;Manual techniques;Passive range of motion;Ultrasound;Electrical Stimulation    PT Next Visit Plan  Continue shoulder strengthening and continue antigravity ROM per POC.    Consulted and Agree with Plan of Care  Patient       Patient will benefit from skilled therapeutic intervention in order to improve the following deficits and impairments:  Pain, Impaired UE functional use, Decreased activity tolerance, Decreased range of motion, Decreased strength, Postural dysfunction  Visit Diagnosis: Chronic left shoulder pain  Stiffness of left shoulder, not elsewhere classified  Muscle weakness (generalized)     Problem List Patient Active Problem List   Diagnosis Date Noted  . Chronic left shoulder pain 09/18/2017  . Cervicalgia 09/18/2017  . Obesity (BMI 30.0-34.9) 05/20/2017  . Peripheral edema 09/07/2015  . Vitamin D deficiency 06/22/2014  . Depression 06/21/2014  . GAD (generalized anxiety disorder) 06/21/2014  . Back pain 06/21/2014  . Osteopenia 02/11/2013  . Constipation 05/08/2010    ,CHRIS, PTA 03/27/2018, 11:14 AM  [facility] [place], [place], [address] Phone: [phone]   Fax:  [phone]  Name: Molly Castaneda MRN: [id number] Date of Birth: Feb 16, 1960

## 2018-04-01 ENCOUNTER — Ambulatory Visit: Payer: Medicare HMO | Admitting: Physical Therapy

## 2018-04-01 ENCOUNTER — Encounter: Payer: Self-pay | Admitting: Physical Therapy

## 2018-04-01 DIAGNOSIS — M25512 Pain in left shoulder: Secondary | ICD-10-CM | POA: Diagnosis not present

## 2018-04-01 DIAGNOSIS — M25561 Pain in right knee: Secondary | ICD-10-CM | POA: Diagnosis not present

## 2018-04-01 DIAGNOSIS — M25612 Stiffness of left shoulder, not elsewhere classified: Secondary | ICD-10-CM | POA: Diagnosis not present

## 2018-04-01 DIAGNOSIS — M6281 Muscle weakness (generalized): Secondary | ICD-10-CM | POA: Diagnosis not present

## 2018-04-01 DIAGNOSIS — G8929 Other chronic pain: Secondary | ICD-10-CM | POA: Diagnosis not present

## 2018-04-01 DIAGNOSIS — M542 Cervicalgia: Secondary | ICD-10-CM | POA: Diagnosis not present

## 2018-04-01 NOTE — Therapy (Signed)
[facility] [place], [place], [address] Phone: [phone]   Fax:  [phone]  Physical Therapy Treatment  Patient Details  Name: Molly Castaneda MRN: [id number] Date of Birth: [birth year] Referring Provider (PT): Joni Fears, MD   Encounter Date: 04/01/2018  PT End of Session - 04/01/18 0856    Visit Number  22    Number of Visits  30    Date for PT Re-Evaluation  05/02/18    Authorization Type  Progress note every 10th visit.    PT Start Time  0901    PT Stop Time  0953    PT Time Calculation (min)  52 min    Activity Tolerance  Patient tolerated treatment well    Behavior During Therapy  [facility] for tasks assessed/performed       Past Medical History:  Diagnosis Date  . Arthritis   . Chronic back pain   . Chronic constipation   . Depression   . MVP (mitral valve prolapse)    had ablation 1998  . Numbness in left leg    s/p spinal surgery  . Supraventricular tachycardia ([place])    ablation 1998  . Vitamin D deficiency     Past Surgical History:  Procedure Laterality Date  . [place]   for svt  . BACK SURGERY  2001   lumbar fusion  . PARTIAL HYSTERECTOMY  1993  . REPAIR EXTENSOR TENDON Left 03/06/2013   Procedure: LEFT LONG BOUTONNIERE REPAIR;  Surgeon: Cammie Sickle., MD;  Location: [place];  Service: Orthopedics;  Laterality: Left;  . SPINE SURGERY    . stimulation system implant  07/31/00   lumbar nerve stimulator-none funtioning now    There were no vitals filed for this visit.  Subjective Assessment - 04/01/18 0856    Subjective  Worried as she found a handle for the therband to use but is worried the band isn't long enough.    Pertinent History  left RTC Repair 12/26/17; chronic pain, depression, internal stimulator but "dead"    Limitations  House hold activities;Lifting    Diagnostic tests  X-Ray normal    Patient Stated Goals  less pain, use my arm better     Currently in Pain?  Yes    Pain Score  5     Pain Location  Shoulder    Pain Orientation  Left    Pain Descriptors / Indicators  Discomfort    Pain Type  Surgical pain    Pain Onset  More than a month ago         [facility] PT Assessment - 04/01/18 0001      Assessment   Medical Diagnosis  Chronic left shoulder pain, S/P left RTC repair    Referring Provider (PT)  Joni Fears, MD    Onset Date/Surgical Date  12/26/17    Hand Dominance  Right    Next MD Visit  04/16/2018    Prior Therapy  yes      Precautions   Precautions  Shoulder    Type of Shoulder Precautions  rotator cuff repair                   [facility] Adult PT Treatment/Exercise - 04/01/18 0001      Shoulder Exercises: Supine   Flexion  AROM;Left;20 reps      Shoulder Exercises: Sidelying   External Rotation  AROM;Left;20 reps    Flexion  AROM;Left;20  reps      Shoulder Exercises: Standing   External Rotation  Strengthening;Left;20 reps    Theraband Level (Shoulder External Rotation)  Level 1 (Yellow)    Internal Rotation  Strengthening;Left;20 reps    Theraband Level (Shoulder Internal Rotation)  Level 1 (Yellow)    Extension  Strengthening;Left;20 reps    Theraband Level (Shoulder Extension)  Level 1 (Yellow)    Row  Strengthening;Left;20 reps    Theraband Level (Shoulder Row)  Level 1 (Yellow)    Other Standing Exercises  Wall slides x15 reps      Shoulder Exercises: Pulleys   Flexion  5 minutes      Shoulder Exercises: ROM/Strengthening   UBE (Upper Arm Bike)  90 RPM x8 min      Modalities   Modalities  Electrical Stimulation;Vasopneumatic      Electrical Stimulation   Electrical Stimulation Location  L shoulder    Electrical Stimulation Action  IFC    Electrical Stimulation Parameters  80-150 hz x15 min    Electrical Stimulation Goals  Pain      Vasopneumatic   Number Minutes Vasopneumatic   15 minutes    Vasopnuematic Location   Shoulder    Vasopneumatic Pressure  Low     Vasopneumatic Temperature   36      Manual Therapy   Manual Therapy  Passive ROM    Passive ROM  PROM of L shoulder into flex, ER, IR with holds at end range               PT Short Term Goals - 02/25/18 0951      PT SHORT TERM GOAL #1   Title  Patient will be independent with initial HEP    Time  3    Period  Weeks    Status  Achieved      PT SHORT TERM GOAL #2   Title  Patient will demonstrate 90+ degrees of left shoulder flexion PROM.    Time  3    Period  Weeks    Status  Achieved      PT SHORT TERM GOAL #3   Title  Patient will demonstrate 20+ degrees of left shoulder ER PROM    Time  3    Period  Weeks    Status  Achieved        PT Long Term Goals - 03/25/18 0945      PT LONG TERM GOAL #1   Title  Patient will be independent with advanced HEP    Time  6    Period  Weeks    Status  Achieved      PT LONG TERM GOAL #2   Title  Patient will demonstrate 120+ degrees of left shoulder flexion AROM to improve ability to perform functional tasks.    Time  6    Period  Weeks    Status  Partially Met   in supine     PT LONG TERM GOAL #3   Title  Patient will demonstrate 60+ degrees of left shoulder external rotation AROM to improve ability to don/doff apparel    Time  6    Period  Weeks    Status  Partially Met      PT LONG TERM GOAL #4   Title  Patient will demonstrate 4/5 or greater left shoulder MMT in all planes to improve stability during functional tasks.    Time  6    Period  Weeks  Status  On-going      PT LONG TERM GOAL #5   Title  Patient will report ability to perform ADLs with left shoulder pain less than or equal to 4/10.    Time  6    Period  Weeks    Status  On-going            Plan - 04/01/18 0940    Clinical Impression Statement  Patient arrived today with continued L shoulder discomfort. Patient able to complete exercises although reporting intermittant discomfort with certain exercises such as resisted ER and IR as well as  SL flexion. Patient corrected during RW4 exercises of any technique deficits for HEP. Firm end feels and smooth arc of motion noted during PROM of L shoulder in all directions. Normal modalities response noted following removal of the modalities.    Rehab Potential  Fair    Clinical Impairments Affecting Rehab Potential  chronic pain    PT Frequency  3x / week    PT Duration  6 weeks    PT Treatment/Interventions  Cryotherapy;ADLs/Self Care Home Management;Moist Heat;Therapeutic exercise;Therapeutic activities;Balance training;Neuromuscular re-education;Patient/family education;Manual techniques;Passive range of motion;Ultrasound;Electrical Stimulation    PT Next Visit Plan  Continue shoulder strengthening and continue antigravity ROM per POC.    Consulted and Agree with Plan of Care  Patient       Patient will benefit from skilled therapeutic intervention in order to improve the following deficits and impairments:  Pain, Impaired UE functional use, Decreased activity tolerance, Decreased range of motion, Decreased strength, Postural dysfunction  Visit Diagnosis: Chronic left shoulder pain  Stiffness of left shoulder, not elsewhere classified  Muscle weakness (generalized)     Problem List Patient Active Problem List   Diagnosis Date Noted  . Chronic left shoulder pain 09/18/2017  . Cervicalgia 09/18/2017  . Obesity (BMI 30.0-34.9) 05/20/2017  . Peripheral edema 09/07/2015  . Vitamin D deficiency 06/22/2014  . Depression 06/21/2014  . GAD (generalized anxiety disorder) 06/21/2014  . Back pain 06/21/2014  . Osteopenia 02/11/2013  . Constipation 05/08/2010    Standley Brooking, PTA 04/01/2018, 10:20 AM  [facility] [address] [place], [place], [address] Phone: [phone]   Fax:  [phone]  Name: Molly Castaneda MRN: [id number] Date of Birth: January 22, 1961

## 2018-04-03 ENCOUNTER — Ambulatory Visit: Payer: Medicare HMO | Admitting: Physical Therapy

## 2018-04-03 ENCOUNTER — Encounter: Payer: Self-pay | Admitting: Physical Therapy

## 2018-04-03 DIAGNOSIS — M25612 Stiffness of left shoulder, not elsewhere classified: Secondary | ICD-10-CM

## 2018-04-03 DIAGNOSIS — G8929 Other chronic pain: Secondary | ICD-10-CM

## 2018-04-03 DIAGNOSIS — M25512 Pain in left shoulder: Principal | ICD-10-CM

## 2018-04-03 DIAGNOSIS — M542 Cervicalgia: Secondary | ICD-10-CM | POA: Diagnosis not present

## 2018-04-03 DIAGNOSIS — M25561 Pain in right knee: Secondary | ICD-10-CM | POA: Diagnosis not present

## 2018-04-03 DIAGNOSIS — M6281 Muscle weakness (generalized): Secondary | ICD-10-CM

## 2018-04-03 NOTE — Therapy (Signed)
[facility] [address] [place], [place], [address] Phone: [phone]   Fax:  [phone]  Physical Therapy Treatment  Patient Details  Name: Molly Castaneda MRN: [id number] Date of Birth: 1960-06-13 Referring Provider (PT): Norlene Campbell, MD   Encounter Date: 04/03/2018  PT End of Session - 04/03/18 0937    Visit Number  23    Number of Visits  30    Date for PT Re-Evaluation  05/02/18    Authorization Type  Progress note every 10th visit.    PT Start Time  0901    PT Stop Time  0950    PT Time Calculation (min)  49 min    Activity Tolerance  Patient tolerated treatment well    Behavior During Therapy  [facility] for tasks assessed/performed       Past Medical History:  Diagnosis Date  . Arthritis   . Chronic back pain   . Chronic constipation   . Depression   . MVP (mitral valve prolapse)    had ablation 1998  . Numbness in left leg    s/p spinal surgery  . Supraventricular tachycardia (HCC)    ablation 1998  . Vitamin D deficiency     Past Surgical History:  Procedure Laterality Date  . AV NODE ABLATION  1998   for svt  . BACK SURGERY  2001   lumbar fusion  . PARTIAL HYSTERECTOMY  1993  . REPAIR EXTENSOR TENDON Left 03/06/2013   Procedure: LEFT LONG BOUTONNIERE REPAIR;  Surgeon: Wyn Forster., MD;  Location: [facility];  Service: Orthopedics;  Laterality: Left;  . SPINE SURGERY    . stimulation system implant  07/31/00   lumbar nerve stimulator-none funtioning now    There were no vitals filed for this visit.  Subjective Assessment - 04/03/18 0902    Subjective  Patient reported doing good after last treatment and better overall, some ongoing discomfort    Pertinent History  left RTC Repair 12/26/17; chronic pain, depression, internal stimulator but "dead"    Limitations  House hold activities;Lifting    Diagnostic tests  X-Ray normal    Patient Stated Goals  less pain, use my arm better     Currently in Pain?  Yes    Pain Score  4     Pain Location  Shoulder    Pain Orientation  Left    Pain Descriptors / Indicators  Discomfort    Pain Type  Surgical pain    Pain Onset  More than a month ago    Pain Frequency  Intermittent    Aggravating Factors   movement    Pain Relieving Factors  at rest         [facility] PT Assessment - 04/03/18 0001      ROM / Strength   AROM / PROM / Strength  AROM;PROM      AROM   AROM Assessment Site  Shoulder    Right/Left Shoulder  Left    Left Shoulder Flexion  95 Degrees    Left Shoulder External Rotation  57 Degrees      PROM   PROM Assessment Site  Shoulder    Right/Left Shoulder  Left    Left Shoulder Flexion  137 Degrees    Left Shoulder External Rotation  60 Degrees                   [facility] Adult PT Treatment/Exercise - 04/03/18 0001  Shoulder Exercises: Supine   Flexion  AROM;Left;20 reps      Shoulder Exercises: Sidelying   External Rotation  AROM;Left;20 reps      Shoulder Exercises: Standing   External Rotation  Strengthening;Left;20 reps    Theraband Level (Shoulder External Rotation)  Level 1 (Yellow)    Internal Rotation  Strengthening;Left;20 reps    Theraband Level (Shoulder Internal Rotation)  Level 1 (Yellow)    Extension  Strengthening;Left;20 reps    Theraband Level (Shoulder Extension)  Level 1 (Yellow)    Row  Strengthening;Left;20 reps    Theraband Level (Shoulder Row)  Level 1 (Yellow)    Other Standing Exercises  wall slides x20, attempted lift off and lower yet too weak to lower eccentricly      Shoulder Exercises: ROM/Strengthening   UBE (Upper Arm Bike)  90 RPM x8 min    Wall Pushups  Other (comment)   2x10     Electrical Stimulation   Electrical Stimulation Location  L shoulder    Electrical Stimulation Action  IFC    Electrical Stimulation Parameters  80-150hz  x82min    Electrical Stimulation Goals  Pain      Vasopneumatic   Number Minutes Vasopneumatic   15 minutes     Vasopnuematic Location   Shoulder    Vasopneumatic Pressure  Low               PT Short Term Goals - 02/25/18 0951      PT SHORT TERM GOAL #1   Title  Patient will be independent with initial HEP    Time  3    Period  Weeks    Status  Achieved      PT SHORT TERM GOAL #2   Title  Patient will demonstrate 90+ degrees of left shoulder flexion PROM.    Time  3    Period  Weeks    Status  Achieved      PT SHORT TERM GOAL #3   Title  Patient will demonstrate 20+ degrees of left shoulder ER PROM    Time  3    Period  Weeks    Status  Achieved        PT Long Term Goals - 04/03/18 9811      PT LONG TERM GOAL #1   Title  Patient will be independent with advanced HEP    Time  6    Period  Weeks    Status  Achieved      PT LONG TERM GOAL #2   Title  Patient will demonstrate 120+ degrees of left shoulder flexion AROM to improve ability to perform functional tasks.    Time  6    Period  Weeks    Status  On-going   AROM 95 degrees 04/03/18     PT LONG TERM GOAL #3   Title  Patient will demonstrate 60+ degrees of left shoulder external rotation AROM to improve ability to don/doff apparel    Time  6    Status  On-going   AROM 57 degrees 04/03/18     PT LONG TERM GOAL #4   Title  Patient will demonstrate 4/5 or greater left shoulder MMT in all planes to improve stability during functional tasks.    Time  6    Period  Weeks    Status  On-going   NT 04/03/18     PT LONG TERM GOAL #5   Title  Patient will report ability to perform  ADLs with left shoulder pain less than or equal to 4/10.    Time  6    Period  Weeks    Status  On-going   5-8/10 pain with light ADL's 04/03/18           Plan - 04/03/18 0940    Clinical Impression Statement  Patient tolerated treatment well today. Patient able to progress with left shoulder exercises today. Patient has improved with ROM today for all motions. Patient has ongoing discomfort yet increases with activity. Patient unable  to perform eccentric lowering today and will benifit from supine and sidelying AROM to improve strength and progression. Goals progressing.     Clinical Impairments Affecting Rehab Potential  chronic pain / surgery 01/01/18 current 13 weeks 04/02/18    PT Frequency  3x / week    PT Duration  6 weeks    PT Treatment/Interventions  Cryotherapy;ADLs/Self Care Home Management;Moist Heat;Therapeutic exercise;Therapeutic activities;Balance training;Neuromuscular re-education;Patient/family education;Manual techniques;Passive range of motion;Ultrasound;Electrical Stimulation    PT Next Visit Plan  Continue shoulder strengthening and continue antigravity ROM per POC.    Consulted and Agree with Plan of Care  Patient       Patient will benefit from skilled therapeutic intervention in order to improve the following deficits and impairments:  Pain, Impaired UE functional use, Decreased activity tolerance, Decreased range of motion, Decreased strength, Postural dysfunction  Visit Diagnosis: Chronic left shoulder pain  Stiffness of left shoulder, not elsewhere classified  Muscle weakness (generalized)     Problem List Patient Active Problem List   Diagnosis Date Noted  . Chronic left shoulder pain 09/18/2017  . Cervicalgia 09/18/2017  . Obesity (BMI 30.0-34.9) 05/20/2017  . Peripheral edema 09/07/2015  . Vitamin D deficiency 06/22/2014  . Depression 06/21/2014  . GAD (generalized anxiety disorder) 06/21/2014  . Back pain 06/21/2014  . Osteopenia 02/11/2013  . Constipation 05/08/2010    Hermelinda Dellen, PTA 04/03/2018, 9:51 AM  [facility] [address] [place], [place], [address] Phone: [phone]   Fax:  [phone]  Name: KYANAH FELAN MRN: [id number] Date of Birth: 1960/05/27

## 2018-04-07 ENCOUNTER — Ambulatory Visit: Payer: Medicare HMO | Attending: Orthopaedic Surgery | Admitting: Physical Therapy

## 2018-04-07 DIAGNOSIS — G8929 Other chronic pain: Secondary | ICD-10-CM

## 2018-04-07 DIAGNOSIS — M25512 Pain in left shoulder: Secondary | ICD-10-CM | POA: Diagnosis not present

## 2018-04-07 DIAGNOSIS — M6281 Muscle weakness (generalized): Secondary | ICD-10-CM | POA: Diagnosis not present

## 2018-04-07 DIAGNOSIS — M25612 Stiffness of left shoulder, not elsewhere classified: Secondary | ICD-10-CM | POA: Insufficient documentation

## 2018-04-07 NOTE — Therapy (Signed)
[facility] [address] [place], [place], [address] Phone: [phone]   Fax:  [phone]  Physical Therapy Treatment  Patient Details  Name: Molly Castaneda MRN: [id number] Date of Birth: [birth year] Referring Provider (PT): Norlene Campbell, MD   Encounter Date: 04/07/2018  PT End of Session - 04/07/18 0826    Visit Number  24    Number of Visits  30    Date for PT Re-Evaluation  05/02/18    Authorization Type  Progress note every 10th visit.    PT Start Time  [id number]    PT Stop Time  0908    PT Time Calculation (min)  52 min    Activity Tolerance  Patient tolerated treatment well    Behavior During Therapy  [facility] for tasks assessed/performed       Past Medical History:  Diagnosis Date  . Arthritis   . Chronic back pain   . Chronic constipation   . Depression   . MVP (mitral valve prolapse)    had ablation 1998  . Numbness in left leg    s/p spinal surgery  . Supraventricular tachycardia (HCC)    ablation 1998  . Vitamin D deficiency     Past Surgical History:  Procedure Laterality Date  . AV NODE ABLATION  1998   for svt  . BACK SURGERY  2001   lumbar fusion  . PARTIAL HYSTERECTOMY  1993  . REPAIR EXTENSOR TENDON Left 03/06/2013   Procedure: LEFT LONG BOUTONNIERE REPAIR;  Surgeon: Wyn Forster., MD;  Location: [facility];  Service: Orthopedics;  Laterality: Left;  . SPINE SURGERY    . stimulation system implant  07/31/00   lumbar nerve stimulator-none funtioning now    There were no vitals filed for this visit.  Subjective Assessment - 04/07/18 0816    Subjective  Reports incisions are still tender.    Pertinent History  left RTC Repair 12/26/17; chronic pain, depression, internal stimulator but "dead"    Limitations  House hold activities;Lifting    Diagnostic tests  X-Ray normal    Patient Stated Goals  less pain, use my arm better    Currently in Pain?  Yes    Pain Score  5     Pain Location   Shoulder    Pain Orientation  Left    Pain Descriptors / Indicators  Discomfort    Pain Type  Surgical pain    Pain Onset  More than a month ago         [facility] PT Assessment - 04/07/18 0001      Assessment   Medical Diagnosis  Chronic left shoulder pain, S/P left RTC repair    Referring Provider (PT)  Norlene Campbell, MD    Onset Date/Surgical Date  12/26/17    Hand Dominance  Right    Next MD Visit  04/16/2018    Prior Therapy  yes      Precautions   Precautions  Shoulder    Type of Shoulder Precautions  rotator cuff repair                   [facility] Adult PT Treatment/Exercise - 04/07/18 0001      Shoulder Exercises: Seated   Flexion  AROM;Right;20 reps    Abduction  AROM;Right;20 reps      Shoulder Exercises: Standing   External Rotation  Strengthening;Left;20 reps    Theraband Level (Shoulder External Rotation)  Level 1 (Yellow)  Internal Rotation  Strengthening;Left;20 reps    Theraband Level (Shoulder Internal Rotation)  Level 1 (Yellow)    Extension  Strengthening;Left;20 reps    Theraband Level (Shoulder Extension)  Level 1 (Yellow)    Extension Limitations  AAROM x20 reps    Row  Strengthening;Left;20 reps    Theraband Level (Shoulder Row)  Level 1 (Yellow)      Shoulder Exercises: Pulleys   Flexion  3 minutes      Shoulder Exercises: ROM/Strengthening   UBE (Upper Arm Bike)  90 RPM x8 min    Wall Wash  flex, CW and CWW wash x20 reps each      Modalities   Modalities  Programmer, applications Location  L shoulder    Electrical Stimulation Action  Pre-Mod    Electrical Stimulation Parameters  80-150 hz x15 min    Electrical Stimulation Goals  Pain      Vasopneumatic   Number Minutes Vasopneumatic   15 minutes    Vasopnuematic Location   Shoulder    Vasopneumatic Pressure  Low    Vasopneumatic Temperature   53               PT Short Term Goals - 02/25/18 0951       PT SHORT TERM GOAL #1   Title  Patient will be independent with initial HEP    Time  3    Period  Weeks    Status  Achieved      PT SHORT TERM GOAL #2   Title  Patient will demonstrate 90+ degrees of left shoulder flexion PROM.    Time  3    Period  Weeks    Status  Achieved      PT SHORT TERM GOAL #3   Title  Patient will demonstrate 20+ degrees of left shoulder ER PROM    Time  3    Period  Weeks    Status  Achieved        PT Long Term Goals - 04/03/18 8657      PT LONG TERM GOAL #1   Title  Patient will be independent with advanced HEP    Time  6    Period  Weeks    Status  Achieved      PT LONG TERM GOAL #2   Title  Patient will demonstrate 120+ degrees of left shoulder flexion AROM to improve ability to perform functional tasks.    Time  6    Period  Weeks    Status  On-going   AROM 95 degrees 04/03/18     PT LONG TERM GOAL #3   Title  Patient will demonstrate 60+ degrees of left shoulder external rotation AROM to improve ability to don/doff apparel    Time  6    Status  On-going   AROM 57 degrees 04/03/18     PT LONG TERM GOAL #4   Title  Patient will demonstrate 4/5 or greater left shoulder MMT in all planes to improve stability during functional tasks.    Time  6    Period  Weeks    Status  On-going   NT 04/03/18     PT LONG TERM GOAL #5   Title  Patient will report ability to perform ADLs with left shoulder pain less than or equal to 4/10.    Time  6    Period  Weeks  Status  On-going   5-8/10 pain with light ADL's 04/03/18           Plan - 04/07/18 0902    Clinical Impression Statement  Patient continues to tolerate treatment well with therex as she was pushed towards more strengthening. Patient required moderate tactile cueing as well as VCs for proper technique especially with seated AROM flexion. No complaints with any resisted exercises. Normal modalities response noted following removal of the modalities.    Rehab Potential  Fair     Clinical Impairments Affecting Rehab Potential  chronic pain / surgery 01/01/18 current 13 weeks 04/02/18    PT Frequency  3x / week    PT Duration  6 weeks    PT Treatment/Interventions  Cryotherapy;ADLs/Self Care Home Management;Moist Heat;Therapeutic exercise;Therapeutic activities;Balance training;Neuromuscular re-education;Patient/family education;Manual techniques;Passive range of motion;Ultrasound;Electrical Stimulation    PT Next Visit Plan  Continue shoulder strengthening and continue antigravity ROM per POC.    Consulted and Agree with Plan of Care  Patient       Patient will benefit from skilled therapeutic intervention in order to improve the following deficits and impairments:  Pain, Impaired UE functional use, Decreased activity tolerance, Decreased range of motion, Decreased strength, Postural dysfunction  Visit Diagnosis: Chronic left shoulder pain  Stiffness of left shoulder, not elsewhere classified  Muscle weakness (generalized)     Problem List Patient Active Problem List   Diagnosis Date Noted  . Chronic left shoulder pain 09/18/2017  . Cervicalgia 09/18/2017  . Obesity (BMI 30.0-34.9) 05/20/2017  . Peripheral edema 09/07/2015  . Vitamin D deficiency 06/22/2014  . Depression 06/21/2014  . GAD (generalized anxiety disorder) 06/21/2014  . Back pain 06/21/2014  . Osteopenia 02/11/2013  . Constipation 05/08/2010    Marvell Fuller, PTA 04/07/2018, 9:29 AM  [facility] [address] [place], [place], [address] Phone: [phone]   Fax:  [phone]  Name: Molly Castaneda MRN: [id number] Date of Birth: 07/31/1960

## 2018-04-08 ENCOUNTER — Ambulatory Visit: Payer: Medicare HMO | Admitting: Physical Therapy

## 2018-04-08 ENCOUNTER — Other Ambulatory Visit: Payer: Self-pay | Admitting: Family

## 2018-04-08 DIAGNOSIS — M542 Cervicalgia: Secondary | ICD-10-CM

## 2018-04-08 DIAGNOSIS — M150 Primary generalized (osteo)arthritis: Secondary | ICD-10-CM

## 2018-04-08 DIAGNOSIS — M159 Polyosteoarthritis, unspecified: Secondary | ICD-10-CM

## 2018-04-08 DIAGNOSIS — M8949 Other hypertrophic osteoarthropathy, multiple sites: Secondary | ICD-10-CM

## 2018-04-09 ENCOUNTER — Ambulatory Visit: Payer: Medicare HMO | Admitting: Physical Therapy

## 2018-04-09 DIAGNOSIS — G8929 Other chronic pain: Secondary | ICD-10-CM

## 2018-04-09 DIAGNOSIS — M25612 Stiffness of left shoulder, not elsewhere classified: Secondary | ICD-10-CM

## 2018-04-09 DIAGNOSIS — M25512 Pain in left shoulder: Principal | ICD-10-CM

## 2018-04-09 DIAGNOSIS — M6281 Muscle weakness (generalized): Secondary | ICD-10-CM | POA: Diagnosis not present

## 2018-04-09 NOTE — Therapy (Signed)
[facility] [address] [place], [place], [address] Phone: [phone]   Fax:  [phone]  Physical Therapy Treatment  Patient Details  Name: Molly Castaneda MRN: [id number] Date of Birth: August 20, 1960 Referring Provider (PT): Norlene Campbell, MD   Encounter Date: 04/09/2018  PT End of Session - 04/09/18 0936    Visit Number  25    Number of Visits  30    Date for PT Re-Evaluation  05/02/18    Authorization Type  Progress note every 10th visit.    PT Start Time  0857    PT Stop Time  [phone]    PT Time Calculation (min)  45 min    Activity Tolerance  Patient tolerated treatment well    Behavior During Therapy  [facility] for tasks assessed/performed       Past Medical History:  Diagnosis Date  . Arthritis   . Chronic back pain   . Chronic constipation   . Depression   . MVP (mitral valve prolapse)    had ablation 1998  . Numbness in left leg    s/p spinal surgery  . Supraventricular tachycardia (HCC)    ablation 1998  . Vitamin D deficiency     Past Surgical History:  Procedure Laterality Date  . AV NODE ABLATION  1998   for svt  . BACK SURGERY  2001   lumbar fusion  . PARTIAL HYSTERECTOMY  1993  . REPAIR EXTENSOR TENDON Left 03/06/2013   Procedure: LEFT LONG BOUTONNIERE REPAIR;  Surgeon: Wyn Forster., MD;  Location: [facility];  Service: Orthopedics;  Laterality: Left;  . SPINE SURGERY    . stimulation system implant  07/31/00   lumbar nerve stimulator-none funtioning now    There were no vitals filed for this visit.  Subjective Assessment - 04/09/18 0904    Subjective  Patient arrived with no new complaints just some weakness in shoulder    Pertinent History  left RTC Repair 12/26/17; chronic pain, depression, internal stimulator but "dead"    Limitations  House hold activities;Lifting    Diagnostic tests  X-Ray normal    Patient Stated Goals  less pain, use my arm better    Currently in Pain?  Yes     Pain Score  4     Pain Location  Shoulder    Pain Orientation  Left    Pain Descriptors / Indicators  Discomfort    Pain Type  Surgical pain    Pain Onset  More than a month ago    Pain Frequency  Intermittent    Aggravating Factors   overhead movement    Pain Relieving Factors  at rest         [facility] PT Assessment - 04/09/18 0001      AROM   AROM Assessment Site  Shoulder    Right/Left Shoulder  Left    Left Shoulder Flexion  93 Degrees    Left Shoulder External Rotation  58 Degrees                   [facility] Adult PT Treatment/Exercise - 04/09/18 0001      Shoulder Exercises: Prone   Retraction  Strengthening;Left    Retraction Weight (lbs)  0    Retraction Limitations  3x10 kneeling    Extension  Strengthening;Left    Extension Weight (lbs)  0    Extension Limitations  3x10 kneeling      Shoulder Exercises: Sidelying  External Rotation  AROM;Strengthening;Left    External Rotation Weight (lbs)  0    External Rotation Limitations  3x10    Flexion  AROM;Strengthening;Left    Flexion Weight (lbs)  0    Flexion Limitations  2x10      Shoulder Exercises: Standing   External Rotation  Strengthening;Left;20 reps    Theraband Level (Shoulder External Rotation)  Level 1 (Yellow)    Internal Rotation  Strengthening;Left;20 reps    Theraband Level (Shoulder Internal Rotation)  Level 1 (Yellow)    Extension  Strengthening;Left;20 reps    Theraband Level (Shoulder Extension)  Level 2 (Red)    Row  Strengthening;Left;20 reps    Theraband Level (Shoulder Row)  Level 2 (Red)    Other Standing Exercises  wall slides  and circles 2x10 each      Shoulder Exercises: Pulleys   Flexion  3 minutes      Shoulder Exercises: ROM/Strengthening   UBE (Upper Arm Bike)  90 RPM x8 min      Electrical Stimulation   Electrical Stimulation Location  L shoulder    Electrical Stimulation Action  premod    Electrical Stimulation Parameters  80-150hz  x74min    Electrical  Stimulation Goals  Pain      Vasopneumatic   Number Minutes Vasopneumatic   10 minutes    Vasopnuematic Location   Shoulder    Vasopneumatic Pressure  Low               PT Short Term Goals - 02/25/18 0951      PT SHORT TERM GOAL #1   Title  Patient will be independent with initial HEP    Time  3    Period  Weeks    Status  Achieved      PT SHORT TERM GOAL #2   Title  Patient will demonstrate 90+ degrees of left shoulder flexion PROM.    Time  3    Period  Weeks    Status  Achieved      PT SHORT TERM GOAL #3   Title  Patient will demonstrate 20+ degrees of left shoulder ER PROM    Time  3    Period  Weeks    Status  Achieved        PT Long Term Goals - 04/09/18 0981      PT LONG TERM GOAL #1   Title  Patient will be independent with advanced HEP    Time  6    Period  Weeks    Status  Achieved      PT LONG TERM GOAL #2   Title  Patient will demonstrate 120+ degrees of left shoulder flexion AROM to improve ability to perform functional tasks.    Time  6    Period  Weeks    Status  On-going   AROM 93 degrees 04/09/18     PT LONG TERM GOAL #3   Title  Patient will demonstrate 60+ degrees of left shoulder external rotation AROM to improve ability to don/doff apparel    Time  6    Period  Weeks    Status  On-going   AROM 58 04/09/18     PT LONG TERM GOAL #4   Title  Patient will demonstrate 4/5 or greater left shoulder MMT in all planes to improve stability during functional tasks.    Time  6    Period  Weeks    Status  On-going   NT  04/09/18     PT LONG TERM GOAL #5   Title  Patient will report ability to perform ADLs with left shoulder pain less than or equal to 4/10.    Time  6    Period  Weeks    Status  On-going            Plan - 04/09/18 0939    Clinical Impression Statement  Patient tolerated treatment well today. Patient progressing with left shoulder strengthening activities with mainly fatigue complaints. Patient has WNL passively and  only limited activly with ROM in left shoulder. Patient progressing toward goals.     Rehab Potential  Fair    Clinical Impairments Affecting Rehab Potential  chronic pain / surgery 01/01/18 current 14 weeks 04/09/18    PT Frequency  3x / week    PT Duration  6 weeks    PT Treatment/Interventions  Cryotherapy;ADLs/Self Care Home Management;Moist Heat;Therapeutic exercise;Therapeutic activities;Balance training;Neuromuscular re-education;Patient/family education;Manual techniques;Passive range of motion;Ultrasound;Electrical Stimulation    PT Next Visit Plan  Continue shoulder strengthening and continue antigravity ROM per POC.    Consulted and Agree with Plan of Care  Patient       Patient will benefit from skilled therapeutic intervention in order to improve the following deficits and impairments:  Pain, Impaired UE functional use, Decreased activity tolerance, Decreased range of motion, Decreased strength, Postural dysfunction  Visit Diagnosis: Chronic left shoulder pain  Stiffness of left shoulder, not elsewhere classified  Muscle weakness (generalized)     Problem List Patient Active Problem List   Diagnosis Date Noted  . Chronic left shoulder pain 09/18/2017  . Cervicalgia 09/18/2017  . Obesity (BMI 30.0-34.9) 05/20/2017  . Peripheral edema 09/07/2015  . Vitamin D deficiency 06/22/2014  . Depression 06/21/2014  . GAD (generalized anxiety disorder) 06/21/2014  . Back pain 06/21/2014  . Osteopenia 02/11/2013  . Constipation 05/08/2010    Hermelinda Dellen, PTA 04/09/2018, 10:02 AM  [facility] [address] [place], [place], [address] Phone: [phone]   Fax:  [phone]  Name: MAKIAYA OFLAHERTY MRN: [id number] Date of Birth: 03-03-1960

## 2018-04-10 ENCOUNTER — Encounter: Payer: Medicare HMO | Admitting: *Deleted

## 2018-04-15 ENCOUNTER — Encounter: Payer: Medicare HMO | Admitting: Physical Therapy

## 2018-04-16 ENCOUNTER — Other Ambulatory Visit: Payer: Self-pay

## 2018-04-16 ENCOUNTER — Ambulatory Visit: Payer: Medicare HMO | Admitting: Physical Therapy

## 2018-04-16 DIAGNOSIS — G8929 Other chronic pain: Secondary | ICD-10-CM

## 2018-04-16 DIAGNOSIS — M6281 Muscle weakness (generalized): Secondary | ICD-10-CM | POA: Diagnosis not present

## 2018-04-16 DIAGNOSIS — M25612 Stiffness of left shoulder, not elsewhere classified: Secondary | ICD-10-CM

## 2018-04-16 DIAGNOSIS — M25512 Pain in left shoulder: Secondary | ICD-10-CM | POA: Diagnosis not present

## 2018-04-16 NOTE — Therapy (Signed)
[facility] [address] [place], [place], [address] Phone: [phone]   Fax:  [phone]  Physical Therapy Treatment  Patient Details  Name: Molly HarperShelia A Castaneda MRN: [id number] Date of Birth: February 25, 1960 Referring Provider (PT): Norlene CampbellPeter Whitfield, MD   Encounter Date: 04/16/2018  PT End of Session - 04/16/18 0945    Visit Number  26    Number of Visits  30    Date for PT Re-Evaluation  05/02/18    Authorization Type  Progress note every 10th visit.    PT Start Time  0901    PT Stop Time  0956    PT Time Calculation (min)  55 min    Activity Tolerance  Patient tolerated treatment well    Behavior During Therapy  [facility] for tasks assessed/performed       Past Medical History:  Diagnosis Date  . Arthritis   . Chronic back pain   . Chronic constipation   . Depression   . MVP (mitral valve prolapse)    had ablation 1998  . Numbness in left leg    s/p spinal surgery  . Supraventricular tachycardia (HCC)    ablation 1998  . Vitamin D deficiency     Past Surgical History:  Procedure Laterality Date  . AV NODE ABLATION  1998   for svt  . BACK SURGERY  2001   lumbar fusion  . PARTIAL HYSTERECTOMY  1993  . REPAIR EXTENSOR TENDON Left 03/06/2013   Procedure: LEFT LONG BOUTONNIERE REPAIR;  Surgeon: Wyn Forsterobert V Sypher Jr., MD;  Location:  SUR[facility]ERY CENTER;  Service: Orthopedics;  Laterality: Left;  . SPINE SURGERY    . stimulation system implant  07/31/00   lumbar nerve stimulator-none funtioning now    There were no vitals filed for this visit.  Subjective Assessment - 04/16/18 0905    Subjective  Patient reported doing well upon arrival today, and no complaints afteer last treatment. Patient was able to do some light yard work and gardending per reported yet limited due to weakness    Pertinent History  left RTC Repair 12/26/17; chronic pain, depression, internal stimulator but "dead"    Limitations  House hold activities;Lifting     Diagnostic tests  X-Ray normal    Patient Stated Goals  less pain, use my arm better    Currently in Pain?  Yes    Pain Score  4     Pain Location  Shoulder    Pain Orientation  Left    Pain Descriptors / Indicators  Discomfort    Pain Type  Surgical pain    Pain Onset  More than a month ago    Pain Frequency  Intermittent    Aggravating Factors   overhead movements    Pain Relieving Factors  rest and medication         [facility] PT Assessment - 04/16/18 0001      AROM   AROM Assessment Site  Shoulder    Right/Left Shoulder  Left    Left Shoulder Flexion  94 Degrees                   [facility] Adult PT Treatment/Exercise - 04/16/18 0001      Shoulder Exercises: Prone   Retraction  Strengthening;Left    Retraction Weight (lbs)  1    Retraction Limitations  3x10 kneeling    Extension  Strengthening;Left    Extension Weight (lbs)  1    Extension Limitations  3x10 kneeling      Shoulder Exercises: Sidelying   External Rotation  AROM;Strengthening;Left    External Rotation Weight (lbs)  0/1    External Rotation Limitations  2x10 0# / x10 1#    Flexion  AROM;Strengthening;Left    Flexion Weight (lbs)  0    Flexion Limitations  2x10      Shoulder Exercises: Standing   External Rotation  Strengthening;Left;20 reps;10 reps    Theraband Level (Shoulder External Rotation)  Level 1 (Yellow)    Internal Rotation  Strengthening;Left;20 reps;10 reps    Theraband Level (Shoulder Internal Rotation)  Level 2 (Red)    Extension  Strengthening;Left;20 reps;10 reps    Theraband Level (Shoulder Extension)  Level 2 (Red)    Row  Strengthening;Left;20 reps;10 reps    Theraband Level (Shoulder Row)  Level 2 (Red)    Other Standing Exercises  wall slides  and circles 2x10 each      Shoulder Exercises: Pulleys   Flexion  3 minutes      Shoulder Exercises: ROM/Strengthening   UBE (Upper Arm Bike)  90 RPM x8 min      Electrical Stimulation   Electrical Stimulation Location  L  shoulder    Electrical Stimulation Action  premod    Electrical Stimulation Parameters  80-150hz  x49min    Electrical Stimulation Goals  Pain      Vasopneumatic   Number Minutes Vasopneumatic   15 minutes    Vasopnuematic Location   Shoulder    Vasopneumatic Pressure  Low               PT Short Term Goals - 02/25/18 0951      PT SHORT TERM GOAL #1   Title  Patient will be independent with initial HEP    Time  3    Period  Weeks    Status  Achieved      PT SHORT TERM GOAL #2   Title  Patient will demonstrate 90+ degrees of left shoulder flexion PROM.    Time  3    Period  Weeks    Status  Achieved      PT SHORT TERM GOAL #3   Title  Patient will demonstrate 20+ degrees of left shoulder ER PROM    Time  3    Period  Weeks    Status  Achieved        PT Long Term Goals - 04/09/18 5176      PT LONG TERM GOAL #1   Title  Patient will be independent with advanced HEP    Time  6    Period  Weeks    Status  Achieved      PT LONG TERM GOAL #2   Title  Patient will demonstrate 120+ degrees of left shoulder flexion AROM to improve ability to perform functional tasks.    Time  6    Period  Weeks    Status  On-going   AROM 93 degrees 04/09/18     PT LONG TERM GOAL #3   Title  Patient will demonstrate 60+ degrees of left shoulder external rotation AROM to improve ability to don/doff apparel    Time  6    Period  Weeks    Status  On-going   AROM 58 04/09/18     PT LONG TERM GOAL #4   Title  Patient will demonstrate 4/5 or greater left shoulder MMT in all planes to improve stability during functional tasks.  Time  6    Period  Weeks    Status  On-going   NT 04/09/18     PT LONG TERM GOAL #5   Title  Patient will report ability to perform ADLs with left shoulder pain less than or equal to 4/10.    Time  6    Period  Weeks    Status  On-going            Plan - 04/16/18 0946    Clinical Impression Statement  Patient tolerated treatment well today. Patient  reported no increased pain from exercises only fatigue after increased reps and required rest break. Patient able to progress with PRE's today. Patient left shoulder WNL passively yet limited with active due to weakness. Red t-band issued today for HEP and educated patient on adding wall slides for HEP also. Patient progressing toward goals.     Clinical Impairments Affecting Rehab Potential  chronic pain / surgery 01/01/18 current 15 weeks 04/16/18    PT Frequency  3x / week    PT Duration  6 weeks    PT Treatment/Interventions  Cryotherapy;ADLs/Self Care Home Management;Moist Heat;Therapeutic exercise;Therapeutic activities;Balance training;Neuromuscular re-education;Patient/family education;Manual techniques;Passive range of motion;Ultrasound;Electrical Stimulation    PT Next Visit Plan  Continue shoulder strengthening and continue sidelying and  antigravity ROM per POC / MD F/U appt 04/23/18    Consulted and Agree with Plan of Care  Patient       Patient will benefit from skilled therapeutic intervention in order to improve the following deficits and impairments:  Pain, Impaired UE functional use, Decreased activity tolerance, Decreased range of motion, Decreased strength, Postural dysfunction  Visit Diagnosis: Chronic left shoulder pain  Stiffness of left shoulder, not elsewhere classified  Muscle weakness (generalized)     Problem List Patient Active Problem List   Diagnosis Date Noted  . Chronic left shoulder pain 09/18/2017  . Cervicalgia 09/18/2017  . Obesity (BMI 30.0-34.9) 05/20/2017  . Peripheral edema 09/07/2015  . Vitamin D deficiency 06/22/2014  . Depression 06/21/2014  . GAD (generalized anxiety disorder) 06/21/2014  . Back pain 06/21/2014  . Osteopenia 02/11/2013  . Constipation 05/08/2010    Hermelinda Dellen, PTA 04/16/2018, 10:03 AM  [facility] [address] [place], [place], [address] Phone: [phone]   Fax:   [phone]  Name: Molly Castaneda MRN: [id number] Date of Birth: 02-15-1960

## 2018-04-17 ENCOUNTER — Ambulatory Visit: Payer: Medicare HMO | Admitting: Physical Therapy

## 2018-04-17 ENCOUNTER — Other Ambulatory Visit: Payer: Self-pay

## 2018-04-17 DIAGNOSIS — M25512 Pain in left shoulder: Principal | ICD-10-CM

## 2018-04-17 DIAGNOSIS — M25612 Stiffness of left shoulder, not elsewhere classified: Secondary | ICD-10-CM

## 2018-04-17 DIAGNOSIS — G8929 Other chronic pain: Secondary | ICD-10-CM | POA: Diagnosis not present

## 2018-04-17 DIAGNOSIS — M6281 Muscle weakness (generalized): Secondary | ICD-10-CM

## 2018-04-17 NOTE — Therapy (Addendum)
[facility] [place], [place], [address] Phone: [phone]   Fax:  [phone]  Physical Therapy Treatment PHYSICAL THERAPY DISCHARGE SUMMARY  Visits from Start of Care: 27  Current functional level related to goals / functional outcomes: See below   Remaining deficits: See goals   Education / Equipment: HEP Plan: Patient agrees to discharge.  Patient goals were partially met. Patient is being discharged due to the patient's request.  ?????    Gabriela Eves, PT, DPT  Patient Details  Name: Molly Castaneda MRN: [id number] Date of Birth: 1960-02-27 Referring Provider (PT): Joni Fears, MD   Encounter Date: 04/17/2018  PT End of Session - 04/17/18 1022    Visit Number  27    Number of Visits  30    Date for PT Re-Evaluation  05/02/18    Authorization Type  Progress note every 10th visit.    PT Start Time  0903    PT Stop Time  1001    PT Time Calculation (min)  58 min    Activity Tolerance  Patient tolerated treatment well    Behavior During Therapy  WFL for tasks assessed/performed       Past Medical History:  Diagnosis Date  . Arthritis   . Chronic back pain   . Chronic constipation   . Depression   . MVP (mitral valve prolapse)    had ablation 1998  . Numbness in left leg    s/p spinal surgery  . Supraventricular tachycardia ([place])    ablation 1998  . Vitamin D deficiency     Past Surgical History:  Procedure Laterality Date  . [place]   for svt  . BACK SURGERY  2001   lumbar fusion  . PARTIAL HYSTERECTOMY  1993  . REPAIR EXTENSOR TENDON Left 03/06/2013   Procedure: LEFT LONG BOUTONNIERE REPAIR;  Surgeon: Cammie Sickle., MD;  Location: [place];  Service: Orthopedics;  Laterality: Left;  . SPINE SURGERY    . stimulation system implant  07/31/00   lumbar nerve stimulator-none funtioning now    There were no vitals filed for this visit.  Subjective  Assessment - 04/17/18 1021    Subjective  Reports that she is not able to tell a difference. Reports that she has pain and fatigue with activity but not at rest.    Pertinent History  left RTC Repair 12/26/17; chronic pain, depression, internal stimulator but "dead"    Limitations  House hold activities;Lifting    Diagnostic tests  X-Ray normal    Patient Stated Goals  less pain, use my arm better    Currently in Pain?  Yes    Pain Score  4     Pain Location  Shoulder    Pain Orientation  Left    Pain Descriptors / Indicators  Discomfort    Pain Type  Surgical pain    Pain Onset  More than a month ago    Pain Frequency  Intermittent    Aggravating Factors   Activity         [facility] PT Assessment - 04/17/18 0001      Assessment   Medical Diagnosis  Chronic left shoulder pain, S/P left RTC repair    Referring Provider (PT)  Joni Fears, MD    Onset Date/Surgical Date  12/26/17    Hand Dominance  Right    Next MD Visit  04/16/2018    Prior Therapy  yes      Precautions   Precautions  Shoulder    Type of Shoulder Precautions  rotator cuff repair                   [facility] Adult PT Treatment/Exercise - 04/17/18 0001      Shoulder Exercises: Prone   Retraction  Strengthening;Left    Retraction Weight (lbs)  1    Retraction Limitations  3x10 kneeling    Extension  Strengthening;Left    Extension Weight (lbs)  1    Extension Limitations  3x10 kneeling      Shoulder Exercises: Standing   Protraction  Strengthening;Left;20 reps;Theraband    Theraband Level (Shoulder Protraction)  Level 2 (Red)    External Rotation  Strengthening;Left;20 reps;Theraband    Theraband Level (Shoulder External Rotation)  Level 2 (Red)    Internal Rotation  Strengthening;Left;20 reps;Theraband    Theraband Level (Shoulder Internal Rotation)  Level 2 (Red)    Flexion  Strengthening;Left;20 reps;Weights    Shoulder Flexion Weight (lbs)  1    Flexion Limitations  half range with tactile  cues to avoid shoulder elevation    Extension  Strengthening;Left;20 reps;10 reps    Theraband Level (Shoulder Extension)  Level 2 (Red)    Row  Strengthening;Left;20 reps;10 reps    Theraband Level (Shoulder Row)  Level 2 (Red)      Shoulder Exercises: ROM/Strengthening   UBE (Upper Arm Bike)  60 RPM x8 min    Wall Wash  flex, CW and CWW wash x30 reps each      Modalities   Modalities  Psychologist, educational Location  L shoulder    Electrical Stimulation Action  Pre-Mod    Electrical Stimulation Parameters  80-150 hz x15 min    Electrical Stimulation Goals  Pain      Vasopneumatic   Number Minutes Vasopneumatic   15 minutes    Vasopnuematic Location   Shoulder    Vasopneumatic Pressure  Low    Vasopneumatic Temperature   68      Manual Therapy   Manual Therapy  Passive ROM    Passive ROM  PROM of L shoulder into flex, ER, IR with holds at end range               PT Short Term Goals - 02/25/18 0951      PT SHORT TERM GOAL #1   Title  Patient will be independent with initial HEP    Time  3    Period  Weeks    Status  Achieved      PT SHORT TERM GOAL #2   Title  Patient will demonstrate 90+ degrees of left shoulder flexion PROM.    Time  3    Period  Weeks    Status  Achieved      PT SHORT TERM GOAL #3   Title  Patient will demonstrate 20+ degrees of left shoulder ER PROM    Time  3    Period  Weeks    Status  Achieved        PT Long Term Goals - 04/09/18 1610      PT LONG TERM GOAL #1   Title  Patient will be independent with advanced HEP    Time  6    Period  Weeks    Status  Achieved      PT LONG TERM GOAL #2  Title  Patient will demonstrate 120+ degrees of left shoulder flexion AROM to improve ability to perform functional tasks.    Time  6    Period  Weeks    Status  On-going   AROM 93 degrees 04/09/18     PT LONG TERM GOAL #3   Title  Patient will demonstrate 60+ degrees  of left shoulder external rotation AROM to improve ability to don/doff apparel    Time  6    Period  Weeks    Status  On-going   AROM 58 04/09/18     PT LONG TERM GOAL #4   Title  Patient will demonstrate 4/5 or greater left shoulder MMT in all planes to improve stability during functional tasks.    Time  6    Period  Weeks    Status  On-going   NT 04/09/18     PT LONG TERM GOAL #5   Title  Patient will report ability to perform ADLs with left shoulder pain less than or equal to 4/10.    Time  6    Period  Weeks    Status  On-going            Plan - 04/17/18 1030    Clinical Impression Statement  Patient presented in clinic with reports of no improvement in L shoulder and reports pain and fatigue with activity. L shoulder elevation compensation noted in treatment and corresponding tactile and verbal cues provided throughout treatment. Patient progressed with resistance as well but educated that with strengthening the fatigue should decrease. Firm end feels and smooth arc of motion noted during PROM of L shoulder. Normal modalities response noted following removal of the modalities.    Rehab Potential  Fair    Clinical Impairments Affecting Rehab Potential  chronic pain / surgery 01/01/18 current 15 weeks 04/16/18    PT Frequency  3x / week    PT Duration  6 weeks    PT Treatment/Interventions  Cryotherapy;ADLs/Self Care Home Management;Moist Heat;Therapeutic exercise;Therapeutic activities;Balance training;Neuromuscular re-education;Patient/family education;Manual techniques;Passive range of motion;Ultrasound;Electrical Stimulation    PT Next Visit Plan  Continue shoulder strengthening and continue sidelying and  antigravity ROM per POC / MD F/U appt 04/23/18    Consulted and Agree with Plan of Care  Patient       Patient will benefit from skilled therapeutic intervention in order to improve the following deficits and impairments:  Pain, Impaired UE functional use, Decreased activity  tolerance, Decreased range of motion, Decreased strength, Postural dysfunction  Visit Diagnosis: Chronic left shoulder pain  Stiffness of left shoulder, not elsewhere classified  Muscle weakness (generalized)     Problem List Patient Active Problem List   Diagnosis Date Noted  . Chronic left shoulder pain 09/18/2017  . Cervicalgia 09/18/2017  . Obesity (BMI 30.0-34.9) 05/20/2017  . Peripheral edema 09/07/2015  . Vitamin D deficiency 06/22/2014  . Depression 06/21/2014  . GAD (generalized anxiety disorder) 06/21/2014  . Back pain 06/21/2014  . Osteopenia 02/11/2013  . Constipation 05/08/2010    Standley Brooking, PTA 04/17/2018, 10:46 AM  [facility] [address] [place], [place], [address] Phone: [phone]   Fax:  [phone]  Name: Molly Castaneda MRN: [id number] Date of Birth: [birth year]

## 2018-04-21 ENCOUNTER — Ambulatory Visit: Payer: Medicare HMO | Admitting: Physical Therapy

## 2018-04-22 ENCOUNTER — Encounter: Payer: Medicare HMO | Admitting: Physical Therapy

## 2018-04-23 ENCOUNTER — Encounter (INDEPENDENT_AMBULATORY_CARE_PROVIDER_SITE_OTHER): Payer: Self-pay | Admitting: Orthopaedic Surgery

## 2018-04-23 ENCOUNTER — Ambulatory Visit (INDEPENDENT_AMBULATORY_CARE_PROVIDER_SITE_OTHER): Payer: Medicare HMO | Admitting: Orthopaedic Surgery

## 2018-04-23 ENCOUNTER — Other Ambulatory Visit: Payer: Self-pay

## 2018-04-23 VITALS — BP 107/72 | HR 83 | Ht 72.0 in | Wt 225.0 lb

## 2018-04-23 DIAGNOSIS — G8929 Other chronic pain: Secondary | ICD-10-CM

## 2018-04-23 DIAGNOSIS — M25512 Pain in left shoulder: Secondary | ICD-10-CM

## 2018-04-23 NOTE — Progress Notes (Signed)
Office Visit Note   Patient: Molly Castaneda           Date of Birth: 10/29/1960           MRN: [id number] Visit Date: 04/23/2018              Requested by: [name], FNP [address] [place], [place] [address] PCP: [name], FNP   Assessment & Plan: Visit Diagnoses:  1. Chronic left shoulder pain     Plan: 4 months status post rotator cuff tear repair left shoulder with an SCD and DCR.  Not having much pain but does have significant weakness with overhead activity.  I wonder whether or not she has had a re-tear of the cuff.  We will continue with her home exercises for 1 more month and have her return.  At that point consider repeat MRI scan  Follow-Up Instructions: Return in about 1 month (around 05/24/2018).   Orders:  No orders of the defined types were placed in this encounter.  No orders of the defined types were placed in this encounter.     Procedures: No procedures performed   Clinical Data: No additional findings.   Subjective: Chief Complaint  Patient presents with  . Left Shoulder - Follow-up    Left shoulder scope DOS 12/26/17  Patient presents today for a 5 week follow up. She had left shoulder arthroscopy with rotator cuff tear repair, SAD, and DCE. She can't raise her arm. She has been doing PT at [facility] and a home exercise program. She feels she is not improving, but can raise her arm farther when lying down.  HPI  Review of Systems   Objective: Vital Signs: BP 107/72   Pulse 83   Ht 6' (1.829 m)   Wt 225 lb (102.1 kg)   BMI 30.52 kg/m   Physical Exam Constitutional:      Appearance: She is well-developed.  Eyes:     Pupils: Pupils are equal, round, and reactive to light.  Pulmonary:     Effort: Pulmonary effort is normal.  Skin:    General: Skin is warm and dry.  Neurological:     Mental Status: She is alert and oriented to person, place, and time.  Psychiatric:        Behavior: Behavior normal.     Ortho Exam  awake alert and oriented x3.  Comfortable sitting.  Could abduct about 90 degrees and hold apposition.  Appeared to have good strength with internal and external rotation.  [name] could not actively place her arm over her head.  I could passively place it fully overhead but she could not maintain that position.  No localized areas of tenderness.  Biceps appears to be intact.  Specialty Comments:  No specialty comments available.  Imaging: No results found.   PMFS History: Patient Active Problem List   Diagnosis Date Noted  . Chronic left shoulder pain 09/18/2017  . Cervicalgia 09/18/2017  . Obesity (BMI 30.0-34.9) 05/20/2017  . Peripheral edema 09/07/2015  . Vitamin D deficiency 06/22/2014  . Depression 06/21/2014  . GAD (generalized anxiety disorder) 06/21/2014  . Back pain 06/21/2014  . Osteopenia 02/11/2013  . Constipation 05/08/2010   Past Medical History:  Diagnosis Date  . Arthritis   . Chronic back pain   . Chronic constipation   . Depression   . MVP (mitral valve prolapse)    had ablation 1998  . Numbness in left leg  s/p spinal surgery  . Supraventricular tachycardia (HCC)    ablation 1998  . Vitamin D deficiency     Family History  Problem Relation Age of Onset  . Coronary artery disease Father   . Colon polyps Father   . Prostate cancer Maternal Grandfather   . Hodgkin's lymphoma Son   . Liver cancer Paternal Uncle        ?  Marland Kitchen Arthritis Mother   . Colon cancer Neg Hx     Past Surgical History:  Procedure Laterality Date  . AV NODE ABLATION  1998   for svt  . BACK SURGERY  2001   lumbar fusion  . PARTIAL HYSTERECTOMY  1993  . REPAIR EXTENSOR TENDON Left 03/06/2013   Procedure: LEFT LONG BOUTONNIERE REPAIR;  Surgeon: Wyn Forster., MD;  Location: [facility];  Service: Orthopedics;  Laterality: Left;  . SPINE SURGERY    . stimulation system implant  07/31/00   lumbar nerve stimulator-none funtioning now   Social History    Occupational History  . Occupation: disabled    Associate Professor: UNEMPLOYED  Tobacco Use  . Smoking status: Never Smoker  . Smokeless tobacco: Never Used  Substance and Sexual Activity  . Alcohol use: No  . Drug use: No  . Sexual activity: Never

## 2018-05-09 ENCOUNTER — Telehealth: Payer: Self-pay | Admitting: Physical Therapy

## 2018-05-09 NOTE — Telephone Encounter (Signed)
Molly Castaneda was contacted today regarding the temporary reduction of OP Rehab Services due to concerns for community transmission of Covid-19.  Left voicemail for patient to give our office a call back if she would like to schedule as post operative patients are facility's priority at this time. Patient also encouraged to continue at this time and we would follow up with her to schedule at future time.

## 2018-05-14 ENCOUNTER — Ambulatory Visit (INDEPENDENT_AMBULATORY_CARE_PROVIDER_SITE_OTHER): Payer: Medicare HMO | Admitting: Orthopaedic Surgery

## 2018-05-14 ENCOUNTER — Encounter (INDEPENDENT_AMBULATORY_CARE_PROVIDER_SITE_OTHER): Payer: Self-pay | Admitting: Orthopaedic Surgery

## 2018-05-14 ENCOUNTER — Other Ambulatory Visit: Payer: Self-pay

## 2018-05-14 VITALS — Ht 72.0 in | Wt 225.0 lb

## 2018-05-14 DIAGNOSIS — M7502 Adhesive capsulitis of left shoulder: Secondary | ICD-10-CM | POA: Diagnosis not present

## 2018-05-14 DIAGNOSIS — G8929 Other chronic pain: Secondary | ICD-10-CM

## 2018-05-14 DIAGNOSIS — M25512 Pain in left shoulder: Principal | ICD-10-CM

## 2018-05-14 MED ORDER — BUPIVACAINE HCL 0.5 % IJ SOLN
2.0000 mL | INTRAMUSCULAR | Status: AC | PRN
  Administered 2018-05-14: 2 mL via INTRA_ARTICULAR

## 2018-05-14 MED ORDER — LIDOCAINE HCL 2 % IJ SOLN
2.0000 mL | INTRAMUSCULAR | Status: AC | PRN
  Administered 2018-05-14: 2 mL

## 2018-05-14 MED ORDER — METHYLPREDNISOLONE ACETATE 40 MG/ML IJ SUSP
80.0000 mg | INTRAMUSCULAR | Status: AC | PRN
  Administered 2018-05-14: 80 mg via INTRA_ARTICULAR

## 2018-05-14 NOTE — Progress Notes (Signed)
Office Visit Note   Patient: Molly Castaneda           Date of Birth: 17-May-1960           MRN: [id number] Visit Date: 05/14/2018              Requested by: [name], FNP [address] [place], [place] [address] PCP: [name], FNP   Assessment & Plan: Visit Diagnoses:  1. Chronic left shoulder pain   2. Adhesive capsulitis of left shoulder     Plan: 5 months status post rotator cuff tear repair left shoulder.  Has developed a frozen shoulder that has responded to some extent with physical therapy.  Will urged her to continue with her home exercises.  Will inject the subacromial space with cortisone and monitor response.  Consider manipulation at some point in the future.  Think there is been definite improvement over the past or so but still having some discomfort as a result of the adhesive capsulitis and her weakness  Follow-Up Instructions: Return in about 6 weeks (around 06/25/2018).   Orders:  Orders Placed This Encounter  Procedures  . Large Joint Inj: L subacromial bursa   No orders of the defined types were placed in this encounter.     Procedures: Large Joint Inj: L subacromial bursa on 05/14/2018 10:03 AM Indications: pain and diagnostic evaluation Details: 25 G 1.5 in needle, anterolateral approach  Arthrogram: No  Medications: 2 mL lidocaine 2 %; 2 mL bupivacaine 0.5 %; 80 mg methylPREDNISolone acetate 40 MG/ML Consent was given by the patient. Immediately prior to procedure a time out was called to verify the correct patient, procedure, equipment, support staff and site/side marked as required. Patient was prepped and draped in the usual sterile fashion.       Clinical Data: No additional findings.   Subjective: Chief Complaint  Patient presents with  . Left Shoulder - Follow-up    Left shoulder scope DOS 12/26/17  Patient presents today for follow up on her left shoulder. She had a left shoulder arthroscopy on 12/26/17 with rotator  cuff tear repair, SCD, and DCR. She is doing home exercises. She said that she has pain with her exercises and when she lays down at night. She is taking morphine for pain, as prescribed by her pain management doctor.  Mrs. Gabay relates that her pain is definitely better than it was from before her surgery but she still has some stiffness and achiness as a result of her adhesive capsulitis.  She feels like her movement has improved but still sore  HPI  Review of Systems   Objective: Vital Signs: Ht 6' (1.829 m)   Wt 225 lb (102.1 kg)   BMI 30.52 kg/m   Physical Exam Constitutional:      Appearance: She is well-developed.  Eyes:     Pupils: Pupils are equal, round, and reactive to light.  Pulmonary:     Effort: Pulmonary effort is normal.  Skin:    General: Skin is warm and dry.  Neurological:     Mental Status: She is alert and oriented to person, place, and time.  Psychiatric:        Behavior: Behavior normal.     Ortho Exam left shoulder with incomplete flexion both actively and passively.  Abduction to 90 degrees.  Lacks maybe 30 degrees to full overhead motion.  Good grip and good release.  Able to scratch the lower part of her back.  Specialty Comments:  No specialty comments available.  Imaging: No results found.   PMFS History: Patient Active Problem List   Diagnosis Date Noted  . Adhesive capsulitis of left shoulder 05/14/2018  . Chronic left shoulder pain 09/18/2017  . Cervicalgia 09/18/2017  . Obesity (BMI 30.0-34.9) 05/20/2017  . Peripheral edema 09/07/2015  . Vitamin D deficiency 06/22/2014  . Depression 06/21/2014  . GAD (generalized anxiety disorder) 06/21/2014  . Back pain 06/21/2014  . Osteopenia 02/11/2013  . Constipation 05/08/2010   Past Medical History:  Diagnosis Date  . Arthritis   . Chronic back pain   . Chronic constipation   . Depression   . MVP (mitral valve prolapse)    had ablation 1998  . Numbness in left leg    s/p spinal  surgery  . Supraventricular tachycardia (HCC)    ablation 1998  . Vitamin D deficiency     Family History  Problem Relation Age of Onset  . Coronary artery disease Father   . Colon polyps Father   . Prostate cancer Maternal Grandfather   . Hodgkin's lymphoma Son   . Liver cancer Paternal Uncle        ?  Marland Kitchen. Arthritis Mother   . Colon cancer Neg Hx     Past Surgical History:  Procedure Laterality Date  . AV NODE ABLATION  1998   for svt  . BACK SURGERY  2001   lumbar fusion  . PARTIAL HYSTERECTOMY  1993  . REPAIR EXTENSOR TENDON Left 03/06/2013   Procedure: LEFT LONG BOUTONNIERE REPAIR;  Surgeon: Wyn Forsterobert V Sypher Jr., MD;  Location: [facility];  Service: Orthopedics;  Laterality: Left;  . SPINE SURGERY    . stimulation system implant  07/31/00   lumbar nerve stimulator-none funtioning now   Social History   Occupational History  . Occupation: disabled    Associate Professormployer: UNEMPLOYED  Tobacco Use  . Smoking status: Never Smoker  . Smokeless tobacco: Never Used  Substance and Sexual Activity  . Alcohol use: No  . Drug use: No  . Sexual activity: Never

## 2018-05-23 DIAGNOSIS — G8921 Chronic pain due to trauma: Secondary | ICD-10-CM | POA: Diagnosis not present

## 2018-05-28 ENCOUNTER — Other Ambulatory Visit: Payer: Self-pay | Admitting: Family

## 2018-05-28 DIAGNOSIS — R609 Edema, unspecified: Secondary | ICD-10-CM

## 2018-06-10 ENCOUNTER — Encounter: Payer: Self-pay | Admitting: Family

## 2018-06-10 ENCOUNTER — Ambulatory Visit (INDEPENDENT_AMBULATORY_CARE_PROVIDER_SITE_OTHER): Payer: Medicare HMO | Admitting: Family

## 2018-06-10 ENCOUNTER — Other Ambulatory Visit: Payer: Self-pay

## 2018-06-10 DIAGNOSIS — K5903 Drug induced constipation: Secondary | ICD-10-CM

## 2018-06-10 DIAGNOSIS — M150 Primary generalized (osteo)arthritis: Secondary | ICD-10-CM | POA: Diagnosis not present

## 2018-06-10 DIAGNOSIS — M8589 Other specified disorders of bone density and structure, multiple sites: Secondary | ICD-10-CM

## 2018-06-10 DIAGNOSIS — G8929 Other chronic pain: Secondary | ICD-10-CM

## 2018-06-10 DIAGNOSIS — F411 Generalized anxiety disorder: Secondary | ICD-10-CM | POA: Diagnosis not present

## 2018-06-10 DIAGNOSIS — R69 Illness, unspecified: Secondary | ICD-10-CM | POA: Diagnosis not present

## 2018-06-10 DIAGNOSIS — M8949 Other hypertrophic osteoarthropathy, multiple sites: Secondary | ICD-10-CM

## 2018-06-10 DIAGNOSIS — E669 Obesity, unspecified: Secondary | ICD-10-CM | POA: Diagnosis not present

## 2018-06-10 DIAGNOSIS — M5442 Lumbago with sciatica, left side: Secondary | ICD-10-CM

## 2018-06-10 DIAGNOSIS — M542 Cervicalgia: Secondary | ICD-10-CM | POA: Diagnosis not present

## 2018-06-10 DIAGNOSIS — F331 Major depressive disorder, recurrent, moderate: Secondary | ICD-10-CM

## 2018-06-10 DIAGNOSIS — M159 Polyosteoarthritis, unspecified: Secondary | ICD-10-CM

## 2018-06-10 DIAGNOSIS — F329 Major depressive disorder, single episode, unspecified: Secondary | ICD-10-CM

## 2018-06-10 DIAGNOSIS — R609 Edema, unspecified: Secondary | ICD-10-CM | POA: Diagnosis not present

## 2018-06-10 DIAGNOSIS — F32A Depression, unspecified: Secondary | ICD-10-CM

## 2018-06-10 MED ORDER — DICLOFENAC SODIUM 75 MG PO TBEC: 75.0000 mg | DELAYED_RELEASE_TABLET | Freq: Two times a day (BID) | ORAL | 1 refills | Status: DC

## 2018-06-10 MED ORDER — CITALOPRAM HYDROBROMIDE 20 MG PO TABS: 20.0000 mg | ORAL_TABLET | Freq: Every day | ORAL | 2 refills | Status: DC

## 2018-06-10 MED ORDER — BUPROPION HCL ER (SR) 150 MG PO TB12: 150.0000 mg | ORAL_TABLET | Freq: Two times a day (BID) | ORAL | 2 refills | Status: DC

## 2018-06-10 MED ORDER — NALOXEGOL OXALATE 25 MG PO TABS: 25.0000 mg | ORAL_TABLET | Freq: Every day | ORAL | 2 refills | Status: DC

## 2018-06-10 MED ORDER — ESTRADIOL 1 MG PO TABS: 1.0000 mg | ORAL_TABLET | Freq: Every day | ORAL | 1 refills | Status: AC

## 2018-06-10 MED ORDER — FUROSEMIDE 40 MG PO TABS: 40.0000 mg | ORAL_TABLET | Freq: Every day | ORAL | 2 refills | Status: DC

## 2018-06-10 NOTE — Progress Notes (Signed)
Virtual Visit via telephone Note  I connected with Molly Castaneda on 06/10/18 at 12:05 AM  by telephone and verified that I am speaking with the correct person using  two identifiers. Molly Castaneda is currently located at home and no one is currently with her during visit. The provider, Jannifer Rodneyhristy Sybel Standish, FNP is located in their office at time of visit.  I discussed the limitations, risks, security and privacy concerns of performing an evaluation and management service by telephone and the availability of in person appointments. I also discussed with the patient that there may be a patient responsible charge related to this service. The patient expressed understanding and agreed to proceed.   History and Present Illness:  PT presents to the office today for chronic follow up. Pt is followed by Pain management every 3 months. She is currently taking ms contin and zanaflex.She states she had surgery on her left rotator cuff. States she is doing better now.   Pt states she continues to gain weight and can not lose any thing.   Anxiety  Presents for follow-up visit. Symptoms include decreased concentration, depressed mood, excessive worry, irritability, nervous/anxious behavior and restlessness. Symptoms occur most days. The severity of symptoms is moderate. The quality of sleep is good.    Depression         This is a chronic problem.  The current episode started more than 1 year ago.   The onset quality is sudden.   The problem occurs intermittently.  The problem has been waxing and waning since onset.  Associated symptoms include decreased concentration and restlessness.  Past medical history includes anxiety.   Constipation  This is a chronic problem. The current episode started more than 1 year ago. The problem has been waxing and waning since onset. Her stool frequency is 2 to 3 times per week. Associated symptoms include back pain. The treatment provided moderate relief.  Back Pain  This is a  chronic problem. The current episode started more than 1 year ago. The problem occurs constantly. The problem has been waxing and waning since onset. The pain is present in the lumbar spine. The quality of the pain is described as aching. The pain is moderate.  Peripheral  Edema PT taking lasix daily. States her swelling is stable.  Osteopenia  Pt taking calcium and vit d daily. Last dexa scan.   Review of Systems  Constitutional: Positive for irritability.  Gastrointestinal: Positive for constipation.  Musculoskeletal: Positive for back pain.  Psychiatric/Behavioral: Positive for decreased concentration and depression. The patient is nervous/anxious.      Observations/Objective: No SOB or distress noted  Assessment and Plan: Molly Castaneda comes in today with chief complaint of No chief complaint on file.   Diagnosis and orders addressed:  1. Chronic left-sided low back pain with left-sided sciatica - diclofenac (VOLTAREN) 75 MG EC tablet; Take 1 tablet (75 mg total) by mouth 2 (two) times daily.  Dispense: 180 tablet; Refill: 1  2. Peripheral edema - furosemide (LASIX) 40 MG tablet; Take 1 tablet (40 mg total) by mouth daily.  Dispense: 90 tablet; Refill: 2  3. Obesity (BMI 30.0-34.9)  4. GAD (generalized anxiety disorder) - citalopram (CELEXA) 20 MG tablet; Take 1 tablet (20 mg total) by mouth daily.  Dispense: 90 tablet; Refill: 2 - buPROPion (WELLBUTRIN SR) 150 MG 12 hr tablet; Take 1 tablet (150 mg total) by mouth 2 (two) times daily.  Dispense: 180 tablet; Refill: 2  5. Moderate  episode of recurrent major depressive disorder (HCC)  6. Drug-induced constipation - naloxegol oxalate (MOVANTIK) 25 MG TABS tablet; Take 1 tablet (25 mg total) by mouth daily.  Dispense: 90 tablet; Refill: 2  7. Osteopenia of multiple sites  8. Neck pain - diclofenac (VOLTAREN) 75 MG EC tablet; Take 1 tablet (75 mg total) by mouth 2 (two) times daily.  Dispense: 180 tablet; Refill: 1  9.  Primary osteoarthritis involving multiple joints - diclofenac (VOLTAREN) 75 MG EC tablet; Take 1 tablet (75 mg total) by mouth 2 (two) times daily.  Dispense: 180 tablet; Refill: 1  10. Depression, unspecified depression type - citalopram (CELEXA) 20 MG tablet; Take 1 tablet (20 mg total) by mouth daily.  Dispense: 90 tablet; Refill: 2 - buPROPion (WELLBUTRIN SR) 150 MG 12 hr tablet; Take 1 tablet (150 mg total) by mouth 2 (two) times daily.  Dispense: 180 tablet; Refill: 2   Labs pending Health Maintenance reviewed Diet and exercise encouraged  Follow up plan: 4 months       I discussed the assessment and treatment plan with the patient. The patient was provided an opportunity to ask questions and all were answered. The patient agreed with the plan and demonstrated an understanding of the instructions.   The patient was advised to call back or seek an in-person evaluation if the symptoms worsen or if the condition fails to improve as anticipated.  The above assessment and management plan was discussed with the patient. The patient verbalized understanding of and has agreed to the management plan. Patient is aware to call the clinic if symptoms persist or worsen. Patient is aware when to return to the clinic for a follow-up visit. Patient educated on when it is appropriate to go to the emergency department.   Time call ended:  12:28 pm  I provided 23 minutes of non-face-to-face time during this encounter.    [name], FNP

## 2018-06-17 ENCOUNTER — Encounter: Payer: Self-pay | Admitting: *Deleted

## 2018-06-17 ENCOUNTER — Ambulatory Visit (INDEPENDENT_AMBULATORY_CARE_PROVIDER_SITE_OTHER): Payer: Medicare HMO | Admitting: *Deleted

## 2018-06-17 ENCOUNTER — Other Ambulatory Visit: Payer: Self-pay

## 2018-06-17 DIAGNOSIS — Z0000 Encounter for general adult medical examination without abnormal findings: Secondary | ICD-10-CM

## 2018-06-17 DIAGNOSIS — F32A Depression, unspecified: Secondary | ICD-10-CM

## 2018-06-17 DIAGNOSIS — F329 Major depressive disorder, single episode, unspecified: Secondary | ICD-10-CM

## 2018-06-17 DIAGNOSIS — F411 Generalized anxiety disorder: Secondary | ICD-10-CM
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
## 2018-06-17 NOTE — Patient Instructions (Signed)
  Molly Castaneda , Thank you for taking time to come for your Medicare Wellness Visit. I appreciate your ongoing commitment to your health goals. Please review the following plan we discussed and let me know if I can assist you in the future.   These are the goals we discussed: Goals              . Have 3 meals a day     Include 3 balanced meals per day in your diet - try to include fruits, vegetables, whole grains and lean proteins.       This is a list of the screening recommended for you and due dates:  Health Maintenance  Topic Date Due  . DEXA scan (bone density measurement)  11/15/2017  . Flu Shot  09/06/2018  . Mammogram  12/15/2018  . Colon Cancer Screening  02/06/2019  . Tetanus Vaccine  10/18/2021  .  Hepatitis C: One time screening is recommended by Center for Disease Control  (CDC) for  adults born from [birth year] through 1967.   Completed  . HIV Screening  Completed

## 2018-07-09 ENCOUNTER — Ambulatory Visit (INDEPENDENT_AMBULATORY_CARE_PROVIDER_SITE_OTHER): Payer: Medicare HMO | Admitting: Orthopaedic Surgery

## 2018-07-09 ENCOUNTER — Encounter: Payer: Self-pay | Admitting: Orthopaedic Surgery

## 2018-07-09 ENCOUNTER — Other Ambulatory Visit: Payer: Self-pay

## 2018-07-09 VITALS — Ht 72.0 in | Wt 225.0 lb

## 2018-07-09 DIAGNOSIS — M7502 Adhesive capsulitis of left shoulder: Secondary | ICD-10-CM

## 2018-07-09 NOTE — Progress Notes (Signed)
Office Visit Note   Patient: Molly Castaneda           Date of Birth: 06-24-1960           MRN: [id number] Visit Date: 07/09/2018              Requested by: [name], FNP [address] [place], [place] [address] PCP: [name], FNP   Assessment & Plan: Visit Diagnoses:  1. Adhesive capsulitis of left shoulder     Plan: Mrs. Casali is status post  rotator cuff tear repair of the left shoulder with an SCD and DCR performed on 12/26/2017.  She developed an adhesive capsulitis postoperatively and attended physical therapy.  She is now doing exercises on her own at home.  She definitely has improved and better but still has some overhead loss of motion associated with some pain and stiffness.  I have had a long discussion with her today regarding her shoulder I think the next step would be a closed manipulation.  I discussed this with her in detail regarding the anesthesia, outpatient nature and possible need for follow-up therapy.  She like to "think about it".  Her initial problem with the shoulder was as a result of an injury.  She will have probably of 15 to 20% permanent partial impairment  Follow-Up Instructions: Return in about 1 month (around 08/08/2018).   Orders:  No orders of the defined types were placed in this encounter.  No orders of the defined types were placed in this encounter.     Procedures: No procedures performed   Clinical Data: No additional findings.   Subjective: Chief Complaint  Patient presents with  . Left Shoulder - Follow-up    Left shoulder scope DOS 12/26/17  Patient presents today for an 8 week follow up. She had a cortisone injection 8 weeks ago in her left shoulder. She said that it is a little better and still aches at night. She has been doing her home exercises every day. She has a history of left shoulder scope with rotator cuff tear repair., SCD, and DCR on 12/26/17. She takes morphine for pain.   HPI  Review of Systems    Objective: Vital Signs: Ht 6' (1.829 m)   Wt 225 lb (102.1 kg)   BMI 30.52 kg/m   Physical Exam Constitutional:      Appearance: She is well-developed.  Eyes:     Pupils: Pupils are equal, round, and reactive to light.  Pulmonary:     Effort: Pulmonary effort is normal.  Skin:    General: Skin is warm and dry.  Neurological:     Mental Status: She is alert and oriented to person, place, and time.  Psychiatric:        Behavior: Behavior normal.     Ortho Exam awake alert and oriented x3.  Comfortable sitting.  Has some atrophy about the left shoulder girdle probably pick because of her lack of overhead motion.  She had about 110 degrees of active flexion about 125 to 30 degrees of passive flexion.  She only externally rotate about 20 degrees on the left and probably 50 to 60 degrees on the left.  She could abduct 90 degrees bilaterally.  Specialty Comments:  No specialty comments available.  Imaging: No results found.   PMFS History: Patient Active Problem List   Diagnosis Date Noted  . Adhesive capsulitis of left shoulder 05/14/2018  . Chronic left shoulder pain 09/18/2017  .  Cervicalgia 09/18/2017  . Obesity (BMI 30.0-34.9) 05/20/2017  . Peripheral edema 09/07/2015  . Vitamin D deficiency 06/22/2014  . Depression 06/21/2014  . GAD (generalized anxiety disorder) 06/21/2014  . Back pain 06/21/2014  . Left ankle pain 10/16/2013  . Osteopenia 02/11/2013  . Left sided sciatica 01/11/2012  . Constipation 05/08/2010   Past Medical History:  Diagnosis Date  . Arthritis   . Chronic back pain   . Chronic constipation   . Depression   . MVP (mitral valve prolapse)    had ablation 1998  . Numbness in left leg    s/p spinal surgery  . Supraventricular tachycardia (HCC)    ablation 1998  . Vitamin D deficiency     Family History  Problem Relation Age of Onset  . Coronary artery disease Father   . Colon polyps Father   . Prostate cancer Maternal Grandfather   .  Hodgkin's lymphoma Son   . Liver cancer Paternal Uncle        ?  Marland Kitchen. Arthritis Mother   . Colon cancer Neg Hx     Past Surgical History:  Procedure Laterality Date  . AV NODE ABLATION  1998   for svt  . BACK SURGERY  2001   lumbar fusion  . PARTIAL HYSTERECTOMY  1993  . REPAIR EXTENSOR TENDON Left 03/06/2013   Procedure: LEFT LONG BOUTONNIERE REPAIR;  Surgeon: Wyn Forsterobert V Sypher Jr., MD;  Location: [facility];  Service: Orthopedics;  Laterality: Left;  . SPINE SURGERY    . stimulation system implant  07/31/00   lumbar nerve stimulator-none funtioning now   Social History   Occupational History  . Occupation: disabled    Associate Professormployer: UNEMPLOYED  Tobacco Use  . Smoking status: Never Smoker  . Smokeless tobacco: Never Used  Substance and Sexual Activity  . Alcohol use: No  . Drug use: No  . Sexual activity: Never

## 2018-08-14 ENCOUNTER — Other Ambulatory Visit: Payer: Self-pay

## 2018-08-14 ENCOUNTER — Ambulatory Visit
Admission: RE | Admit: 2018-08-14 | Discharge: 2018-08-14 | Disposition: A | Discharge status: Home / Self Care | Payer: Medicare HMO | Source: Ambulatory Visit | Attending: Family Medicine | Admitting: Family Medicine

## 2018-08-14 DIAGNOSIS — Z1231 Encounter for screening mammogram for malignant neoplasm of breast: Secondary | ICD-10-CM

## 2018-10-07 DIAGNOSIS — R69 Illness, unspecified: Secondary | ICD-10-CM | POA: Diagnosis not present

## 2018-10-14 ENCOUNTER — Ambulatory Visit (INDEPENDENT_AMBULATORY_CARE_PROVIDER_SITE_OTHER): Payer: Medicare HMO | Admitting: Family

## 2018-10-14 ENCOUNTER — Encounter: Payer: Self-pay | Admitting: Family

## 2018-10-14 DIAGNOSIS — E669 Obesity, unspecified: Secondary | ICD-10-CM | POA: Diagnosis not present

## 2018-10-14 DIAGNOSIS — M5442 Lumbago with sciatica, left side: Secondary | ICD-10-CM

## 2018-10-14 DIAGNOSIS — E559 Vitamin D deficiency, unspecified: Secondary | ICD-10-CM | POA: Diagnosis not present

## 2018-10-14 DIAGNOSIS — F411 Generalized anxiety disorder: Secondary | ICD-10-CM

## 2018-10-14 DIAGNOSIS — R5383 Other fatigue: Secondary | ICD-10-CM | POA: Diagnosis not present

## 2018-10-14 DIAGNOSIS — M8580 Other specified disorders of bone density and structure, unspecified site: Secondary | ICD-10-CM

## 2018-10-14 DIAGNOSIS — K5903 Drug induced constipation: Secondary | ICD-10-CM

## 2018-10-14 DIAGNOSIS — F331 Major depressive disorder, recurrent, moderate: Secondary | ICD-10-CM

## 2018-10-14 DIAGNOSIS — M5432 Sciatica, left side: Secondary | ICD-10-CM

## 2018-10-14 DIAGNOSIS — R609 Edema, unspecified: Secondary | ICD-10-CM | POA: Diagnosis not present

## 2018-10-14 DIAGNOSIS — M25512 Pain in left shoulder: Secondary | ICD-10-CM | POA: Diagnosis not present

## 2018-10-14 DIAGNOSIS — R69 Illness, unspecified: Secondary | ICD-10-CM | POA: Diagnosis not present

## 2018-10-14 DIAGNOSIS — E785 Hyperlipidemia, unspecified: Secondary | ICD-10-CM

## 2018-10-14 DIAGNOSIS — M7502 Adhesive capsulitis of left shoulder: Secondary | ICD-10-CM | POA: Diagnosis not present

## 2018-10-14 DIAGNOSIS — G8929 Other chronic pain: Secondary | ICD-10-CM

## 2018-10-14 NOTE — Progress Notes (Signed)
Virtual Visit via telephone Note Due to COVID-19 pandemic this visit was conducted virtually. This visit type was conducted due to national recommendations for restrictions regarding the COVID-19 Pandemic (e.g. social distancing, sheltering in place) in an effort to limit this patient's exposure and mitigate transmission in our community. All issues noted in this document were discussed and addressed.  A physical exam was not performed with this format.  I connected with Molly Castaneda on 10/14/18 at 9:04 AM by telephone and verified that I am speaking with the correct person using two identifiers. Molly Castaneda is currently located at home  and no one is currently with her during visit. The provider, Evelina Dun, FNP is located in their office at time of visit.  I discussed the limitations, risks, security and privacy concerns of performing an evaluation and management service by telephone and the availability of in person appointments. I also discussed with the patient that there may be a patient responsible charge related to this service. The patient expressed understanding and agreed to proceed.   History and Present Illness:  PT presents to the office todayfor chronic follow up. Pt is followed by Pain management every 3 months. She is currently taking ms contin and zanaflex.She states she had surgery on her left rotator cuff. She reports her shoulder is frozen and completed PT. She states this is "better, but not 100%."  She states her Pain Management provider is changing because he is leaving. States they are changing their policy and will no longer be able to prescribe her Klonopin and Adderall. We dicussed I would not be able to prescribe this for her. She is requesting [facility] Referral.  Hypertension This is a chronic problem. The current episode started more than 1 year ago. Associated symptoms include anxiety, malaise/fatigue, palpitations and peripheral edema. Risk factors  for coronary artery disease include dyslipidemia, obesity and sedentary lifestyle. The current treatment provides moderate improvement. There is no history of kidney disease or CAD/MI.  Depression        This is a chronic problem.  The current episode started more than 1 year ago.   The onset quality is gradual.   The problem occurs every several days.  The problem has been waxing and waning since onset.  Associated symptoms include fatigue, helplessness, hopelessness, irritable, restlessness, decreased interest and sad.  Past medical history includes anxiety.   Anxiety Presents for follow-up visit. Symptoms include depressed mood, excessive worry, irritability, nervous/anxious behavior, palpitations and restlessness. Symptoms occur most days. The severity of symptoms is moderate.    Back Pain This is a chronic problem. The current episode started more than 1 year ago. The problem occurs intermittently. The problem has been waxing and waning since onset. The quality of the pain is described as aching. The pain is at a severity of 7/10. The pain is moderate. The symptoms are aggravated by bending and lying down. Associated symptoms include leg pain.  Constipation This is a chronic problem. The current episode started more than 1 year ago. The problem has been waxing and waning since onset. Her stool frequency is 2 to 3 times per week. Associated symptoms include back pain. Risk factors include obesity. She has tried laxatives for the symptoms. The treatment provided moderate relief.      Review of Systems  Constitutional: Positive for fatigue, irritability and malaise/fatigue.  Cardiovascular: Positive for palpitations.  Gastrointestinal: Positive for constipation.  Musculoskeletal: Positive for back pain.  Psychiatric/Behavioral: Positive for depression. The  patient is nervous/anxious.   All other systems reviewed and are negative.    Observations/Objective: No SOB or distressed noted    Assessment and Plan: Molly Castaneda comes in today with chief complaint of No chief complaint on file.   Diagnosis and orders addressed:  1. Moderate episode of recurrent major depressive disorder ([facility]) - Ambulatory referral to Psychiatry - CMP14+EGFR; Future - CBC with Differential/Platelet; Future  2. Drug-induced constipation - CMP14+EGFR; Future - CBC with Differential/Platelet; Future - Lipid panel; Future  3. GAD (generalized anxiety disorder) - Ambulatory referral to Psychiatry - CMP14+EGFR; Future - CBC with Differential/Platelet; Future  4. Obesity (BMI 30.0-34.9) - CMP14+EGFR; Future - CBC with Differential/Platelet; Future  5. Vitamin D deficiency - CMP14+EGFR; Future - CBC with Differential/Platelet; Future - VITAMIN D 25 Hydroxy (Vit-D Deficiency, Fractures); Future  6. Chronic left shoulder pain - CMP14+EGFR; Future - CBC with Differential/Platelet; Future  7. Chronic left-sided low back pain with left-sided sciatica  - CMP14+EGFR; Future - CBC with Differential/Platelet; Future  8. Adhesive capsulitis of left shoulder - CMP14+EGFR; Future - CBC with Differential/Platelet; Future  9. Peripheral edema Wear compression hose daily Low salt diet - CMP14+EGFR; Future - CBC with Differential/Platelet; Future  10. Fatigue, unspecified type - CMP14+EGFR; Future - CBC with Differential/Platelet; Future - TSH; Future - VITAMIN D 25 Hydroxy (Vit-D Deficiency, Fractures); Future  11. Hyperlipidemia, unspecified hyperlipidemia type - CMP14+EGFR; Future - CBC with Differential/Platelet; Future - Lipid panel; Future  12. Left sided sciatica   Keep follow up with Pain Clinic Referral to [facility] pending- I can not write rx for controlled medication  Labs pending Health Maintenance reviewed Diet and exercise encouraged  Follow up plan: 6 months        I discussed the assessment and treatment plan with the patient. The patient was  provided an opportunity to ask questions and all were answered. The patient agreed with the plan and demonstrated an understanding of the instructions.   The patient was advised to call back or seek an in-person evaluation if the symptoms worsen or if the condition fails to improve as anticipated.  The above assessment and management plan was discussed with the patient. The patient verbalized understanding of and has agreed to the management plan. Patient is aware to call the clinic if symptoms persist or worsen. Patient is aware when to return to the clinic for a follow-up visit. Patient educated on when it is appropriate to go to the emergency department.   Time call ended:  9:31 AM  I provided 27 minutes of non-face-to-face time during this encounter.    Evelina Dun, FNP

## 2018-10-22 DIAGNOSIS — R69 Illness, unspecified: Secondary | ICD-10-CM | POA: Diagnosis not present

## 2018-10-30 ENCOUNTER — Other Ambulatory Visit: Payer: Medicare HMO

## 2018-10-30 ENCOUNTER — Ambulatory Visit (INDEPENDENT_AMBULATORY_CARE_PROVIDER_SITE_OTHER): Payer: Medicare HMO

## 2018-10-30 ENCOUNTER — Telehealth: Payer: Self-pay | Admitting: Family

## 2018-10-30 DIAGNOSIS — F411 Generalized anxiety disorder: Secondary | ICD-10-CM

## 2018-10-30 DIAGNOSIS — Z23 Encounter for immunization: Secondary | ICD-10-CM | POA: Diagnosis not present

## 2018-10-30 DIAGNOSIS — R69 Illness, unspecified: Secondary | ICD-10-CM | POA: Diagnosis not present

## 2018-10-30 DIAGNOSIS — M5442 Lumbago with sciatica, left side: Secondary | ICD-10-CM | POA: Diagnosis not present

## 2018-10-30 DIAGNOSIS — K5903 Drug induced constipation: Secondary | ICD-10-CM | POA: Diagnosis not present

## 2018-10-30 DIAGNOSIS — G8929 Other chronic pain: Secondary | ICD-10-CM | POA: Diagnosis not present

## 2018-10-30 DIAGNOSIS — F331 Major depressive disorder, recurrent, moderate: Secondary | ICD-10-CM

## 2018-10-30 DIAGNOSIS — R609 Edema, unspecified: Secondary | ICD-10-CM

## 2018-10-30 DIAGNOSIS — R5383 Other fatigue: Secondary | ICD-10-CM | POA: Diagnosis not present

## 2018-10-30 DIAGNOSIS — E669 Obesity, unspecified: Secondary | ICD-10-CM | POA: Diagnosis not present

## 2018-10-30 DIAGNOSIS — M25512 Pain in left shoulder: Secondary | ICD-10-CM | POA: Diagnosis not present

## 2018-10-30 DIAGNOSIS — E559 Vitamin D deficiency, unspecified: Secondary | ICD-10-CM

## 2018-10-30 DIAGNOSIS — E785 Hyperlipidemia, unspecified: Secondary | ICD-10-CM | POA: Diagnosis not present

## 2018-10-30 DIAGNOSIS — M7502 Adhesive capsulitis of left shoulder: Secondary | ICD-10-CM

## 2018-10-31 LAB — CBC WITH DIFFERENTIAL/PLATELET
Basophils Absolute: 0 10*3/uL (ref 0.0–0.2)
Basos: 0 %
EOS (ABSOLUTE): 0.1 10*3/uL (ref 0.0–0.4)
Eos: 2 %
Hematocrit: 40.9 % (ref 34.0–46.6)
Hemoglobin: 13 g/dL (ref 11.1–15.9)
Immature Grans (Abs): 0 10*3/uL (ref 0.0–0.1)
Immature Granulocytes: 0 %
Lymphocytes Absolute: 1.8 10*3/uL (ref 0.7–3.1)
Lymphs: 30 %
MCH: 27.1 pg (ref 26.6–33.0)
MCHC: 31.8 g/dL (ref 31.5–35.7)
MCV: 85 fL (ref 79–97)
Monocytes Absolute: 0.5 10*3/uL (ref 0.1–0.9)
Monocytes: 9 %
Neutrophils Absolute: 3.5 10*3/uL (ref 1.4–7.0)
Neutrophils: 59 %
Platelets: 330 10*3/uL (ref 150–450)
RBC: 4.8 x10E6/uL (ref 3.77–5.28)
RDW: 12.8 % (ref 11.7–15.4)
WBC: 5.9 10*3/uL (ref 3.4–10.8)

## 2018-10-31 LAB — CMP14+EGFR
ALT: 15 IU/L (ref 0–32)
AST: 17 IU/L (ref 0–40)
Albumin/Globulin Ratio: 1.5 (ref 1.2–2.2)
Albumin: 4 g/dL (ref 3.8–4.9)
Alkaline Phosphatase: 73 IU/L (ref 39–117)
BUN/Creatinine Ratio: 16 (ref 12–28)
BUN: 14 mg/dL (ref 8–27)
Bilirubin Total: 0.7 mg/dL (ref 0.0–1.2)
CO2: 25 mmol/L (ref 20–29)
Calcium: 8.9 mg/dL (ref 8.7–10.3)
Chloride: 103 mmol/L (ref 96–106)
Creatinine, Ser: 0.85 mg/dL (ref 0.57–1.00)
GFR calc Af Amer: 86 mL/min/{1.73_m2} (ref >59)
GFR calc non Af Amer: 75 mL/min/{1.73_m2} (ref >59)
Globulin, Total: 2.6 g/dL (ref 1.5–4.5)
Glucose: 81 mg/dL (ref 65–99)
Potassium: 4.5 mmol/L (ref 3.5–5.2)
Sodium: 139 mmol/L (ref 134–144)
Total Protein: 6.6 g/dL (ref 6.0–8.5)

## 2018-10-31 LAB — LIPID PANEL
Chol/HDL Ratio: 2.8 ratio (ref 0.0–4.4)
Cholesterol, Total: 196 mg/dL (ref 100–199)
HDL: 71 mg/dL (ref >39)
LDL Chol Calc (NIH): 104 mg/dL — ABNORMAL HIGH (ref 0–99)
Triglycerides: 119 mg/dL (ref 0–149)
VLDL Cholesterol Cal: 21 mg/dL (ref 5–40)

## 2018-10-31 LAB — TSH: TSH: 3.02 u[IU]/mL (ref 0.450–4.500)

## 2018-10-31 LAB — VITAMIN D 25 HYDROXY (VIT D DEFICIENCY, FRACTURES): Vit D, 25-Hydroxy: 34.7 ng/mL (ref 30.0–100.0)

## 2018-11-06 NOTE — Telephone Encounter (Signed)
This was placed on 10/14/18. Can we follow up on this?

## 2018-11-06 NOTE — Telephone Encounter (Signed)
This Patient states she needs a Ref to [facility] this was discussed on her 10/14/2018 visit.

## 2018-11-07 NOTE — Telephone Encounter (Signed)
Once the Ref is placed to [facility] can no longer work on It as it becomes a Software engineer. The only thing I can do is call and see where they are with scheduling her.

## 2018-11-19 DIAGNOSIS — R69 Illness, unspecified: Secondary | ICD-10-CM | POA: Diagnosis not present

## 2018-11-25 DIAGNOSIS — R69 Illness, unspecified: Secondary | ICD-10-CM | POA: Diagnosis not present

## 2018-11-25 DIAGNOSIS — M961 Postlaminectomy syndrome, not elsewhere classified: Secondary | ICD-10-CM | POA: Diagnosis not present

## 2018-11-25 DIAGNOSIS — G8921 Chronic pain due to trauma: Secondary | ICD-10-CM | POA: Diagnosis not present

## 2018-11-25 DIAGNOSIS — G894 Chronic pain syndrome: Secondary | ICD-10-CM | POA: Diagnosis not present

## 2018-11-25 DIAGNOSIS — F1190 Opioid use, unspecified, uncomplicated: Secondary | ICD-10-CM | POA: Diagnosis not present

## 2018-12-17 DIAGNOSIS — R69 Illness, unspecified: Secondary | ICD-10-CM | POA: Diagnosis not present

## 2018-12-22 ENCOUNTER — Other Ambulatory Visit: Payer: Self-pay | Admitting: *Deleted

## 2018-12-22 ENCOUNTER — Telehealth: Payer: Self-pay | Admitting: *Deleted

## 2018-12-22 DIAGNOSIS — M545 Low back pain, unspecified: Secondary | ICD-10-CM

## 2018-12-22 NOTE — Telephone Encounter (Signed)
Patient aware ,we received a fax from [facility] requesting a lumbar x-ray be done on patient.  Future order has been placed.  Results should be sent to  Attention: Harvey PA  [address] [place], [place] , phone [phone] ,  Fax [phone].

## 2018-12-25 DIAGNOSIS — R69 Illness, unspecified: Secondary | ICD-10-CM | POA: Diagnosis not present

## 2018-12-25 DIAGNOSIS — F332 Major depressive disorder, recurrent severe without psychotic features: Secondary | ICD-10-CM | POA: Diagnosis not present

## 2018-12-25 DIAGNOSIS — F411 Generalized anxiety disorder: Secondary | ICD-10-CM | POA: Diagnosis not present

## 2018-12-26 ENCOUNTER — Ambulatory Visit (INDEPENDENT_AMBULATORY_CARE_PROVIDER_SITE_OTHER): Payer: Medicare HMO

## 2018-12-26 ENCOUNTER — Other Ambulatory Visit: Payer: Self-pay

## 2018-12-26 ENCOUNTER — Other Ambulatory Visit: Payer: Medicare HMO

## 2018-12-26 DIAGNOSIS — M545 Low back pain, unspecified: Secondary | ICD-10-CM

## 2018-12-29 DIAGNOSIS — Z01419 Encounter for gynecological examination (general) (routine) without abnormal findings: Secondary | ICD-10-CM | POA: Diagnosis not present

## 2018-12-29 DIAGNOSIS — Z6833 Body mass index (BMI) 33.0-33.9, adult: Secondary | ICD-10-CM | POA: Diagnosis not present

## 2018-12-31 ENCOUNTER — Other Ambulatory Visit: Payer: Self-pay | Admitting: Family

## 2018-12-31 DIAGNOSIS — M8949 Other hypertrophic osteoarthropathy, multiple sites: Secondary | ICD-10-CM

## 2018-12-31 DIAGNOSIS — M542 Cervicalgia: Secondary | ICD-10-CM

## 2018-12-31 DIAGNOSIS — M159 Polyosteoarthritis, unspecified: Secondary | ICD-10-CM

## 2018-12-31 DIAGNOSIS — M5442 Lumbago with sciatica, left side: Secondary | ICD-10-CM

## 2018-12-31 DIAGNOSIS — G8929 Other chronic pain: Secondary | ICD-10-CM

## 2019-01-08 DIAGNOSIS — R69 Illness, unspecified: Secondary | ICD-10-CM | POA: Diagnosis not present

## 2019-01-08 DIAGNOSIS — F411 Generalized anxiety disorder: Secondary | ICD-10-CM | POA: Diagnosis not present

## 2019-01-08 DIAGNOSIS — F332 Major depressive disorder, recurrent severe without psychotic features: Secondary | ICD-10-CM | POA: Diagnosis not present

## 2019-01-14 DIAGNOSIS — F411 Generalized anxiety disorder: Secondary | ICD-10-CM | POA: Diagnosis not present

## 2019-01-14 DIAGNOSIS — F332 Major depressive disorder, recurrent severe without psychotic features: Secondary | ICD-10-CM | POA: Diagnosis not present

## 2019-01-14 DIAGNOSIS — R69 Illness, unspecified: Secondary | ICD-10-CM | POA: Diagnosis not present

## 2019-01-23 DIAGNOSIS — R69 Illness, unspecified: Secondary | ICD-10-CM | POA: Diagnosis not present

## 2019-01-23 DIAGNOSIS — M961 Postlaminectomy syndrome, not elsewhere classified: Secondary | ICD-10-CM | POA: Diagnosis not present

## 2019-01-23 DIAGNOSIS — M545 Low back pain: Secondary | ICD-10-CM | POA: Diagnosis not present

## 2019-01-23 DIAGNOSIS — G8921 Chronic pain due to trauma: Secondary | ICD-10-CM | POA: Diagnosis not present

## 2019-01-23 DIAGNOSIS — G8929 Other chronic pain: Secondary | ICD-10-CM | POA: Diagnosis not present

## 2019-02-12 DIAGNOSIS — F411 Generalized anxiety disorder: Secondary | ICD-10-CM | POA: Diagnosis not present

## 2019-02-12 DIAGNOSIS — R69 Illness, unspecified: Secondary | ICD-10-CM | POA: Diagnosis not present

## 2019-02-12 DIAGNOSIS — F332 Major depressive disorder, recurrent severe without psychotic features: Secondary | ICD-10-CM | POA: Diagnosis not present

## 2019-02-19 DIAGNOSIS — F332 Major depressive disorder, recurrent severe without psychotic features: Secondary | ICD-10-CM | POA: Diagnosis not present

## 2019-02-19 DIAGNOSIS — F411 Generalized anxiety disorder: Secondary | ICD-10-CM | POA: Diagnosis not present

## 2019-02-19 DIAGNOSIS — R69 Illness, unspecified: Secondary | ICD-10-CM | POA: Diagnosis not present

## 2019-03-19 DIAGNOSIS — F411 Generalized anxiety disorder: Secondary | ICD-10-CM | POA: Diagnosis not present

## 2019-03-19 DIAGNOSIS — R69 Illness, unspecified: Secondary | ICD-10-CM | POA: Diagnosis not present

## 2019-03-19 DIAGNOSIS — F332 Major depressive disorder, recurrent severe without psychotic features: Secondary | ICD-10-CM | POA: Diagnosis not present

## 2019-03-25 ENCOUNTER — Other Ambulatory Visit: Payer: Self-pay | Admitting: Family

## 2019-03-25 DIAGNOSIS — M542 Cervicalgia: Secondary | ICD-10-CM

## 2019-03-25 DIAGNOSIS — M8949 Other hypertrophic osteoarthropathy, multiple sites: Secondary | ICD-10-CM

## 2019-03-25 DIAGNOSIS — M159 Polyosteoarthritis, unspecified: Secondary | ICD-10-CM

## 2019-03-25 DIAGNOSIS — G8929 Other chronic pain: Secondary | ICD-10-CM

## 2019-04-01 DIAGNOSIS — F332 Major depressive disorder, recurrent severe without psychotic features: Secondary | ICD-10-CM | POA: Diagnosis not present

## 2019-04-01 DIAGNOSIS — R69 Illness, unspecified: Secondary | ICD-10-CM | POA: Diagnosis not present

## 2019-04-01 DIAGNOSIS — F411 Generalized anxiety disorder: Secondary | ICD-10-CM | POA: Diagnosis not present

## 2019-04-06 DIAGNOSIS — R69 Illness, unspecified: Secondary | ICD-10-CM | POA: Diagnosis not present

## 2019-04-06 DIAGNOSIS — F332 Major depressive disorder, recurrent severe without psychotic features: Secondary | ICD-10-CM | POA: Diagnosis not present

## 2019-04-06 DIAGNOSIS — F411 Generalized anxiety disorder: Secondary | ICD-10-CM | POA: Diagnosis not present

## 2019-04-10 ENCOUNTER — Other Ambulatory Visit: Payer: Self-pay

## 2019-04-13 ENCOUNTER — Ambulatory Visit (INDEPENDENT_AMBULATORY_CARE_PROVIDER_SITE_OTHER): Payer: Medicare HMO | Admitting: Family

## 2019-04-13 ENCOUNTER — Ambulatory Visit (INDEPENDENT_AMBULATORY_CARE_PROVIDER_SITE_OTHER): Payer: Medicare HMO

## 2019-04-13 ENCOUNTER — Other Ambulatory Visit: Payer: Self-pay

## 2019-04-13 ENCOUNTER — Encounter: Payer: Self-pay | Admitting: Family

## 2019-04-13 VITALS — BP 123/74 | Temp 92.0°F | Ht 72.0 in | Wt 226.8 lb

## 2019-04-13 DIAGNOSIS — M25512 Pain in left shoulder: Secondary | ICD-10-CM

## 2019-04-13 DIAGNOSIS — E669 Obesity, unspecified: Secondary | ICD-10-CM

## 2019-04-13 DIAGNOSIS — M8589 Other specified disorders of bone density and structure, multiple sites: Secondary | ICD-10-CM | POA: Diagnosis not present

## 2019-04-13 DIAGNOSIS — M5442 Lumbago with sciatica, left side: Secondary | ICD-10-CM | POA: Diagnosis not present

## 2019-04-13 DIAGNOSIS — Z1211 Encounter for screening for malignant neoplasm of colon: Secondary | ICD-10-CM

## 2019-04-13 DIAGNOSIS — E559 Vitamin D deficiency, unspecified: Secondary | ICD-10-CM | POA: Diagnosis not present

## 2019-04-13 DIAGNOSIS — G8929 Other chronic pain: Secondary | ICD-10-CM | POA: Diagnosis not present

## 2019-04-13 DIAGNOSIS — K5903 Drug induced constipation: Secondary | ICD-10-CM | POA: Diagnosis not present

## 2019-04-13 DIAGNOSIS — F411 Generalized anxiety disorder: Secondary | ICD-10-CM

## 2019-04-13 DIAGNOSIS — E785 Hyperlipidemia, unspecified: Secondary | ICD-10-CM

## 2019-04-13 DIAGNOSIS — M8588 Other specified disorders of bone density and structure, other site: Secondary | ICD-10-CM

## 2019-04-13 DIAGNOSIS — M5432 Sciatica, left side: Secondary | ICD-10-CM

## 2019-04-13 DIAGNOSIS — F331 Major depressive disorder, recurrent, moderate: Secondary | ICD-10-CM | POA: Diagnosis not present

## 2019-04-13 DIAGNOSIS — Z780 Asymptomatic menopausal state: Secondary | ICD-10-CM | POA: Diagnosis not present

## 2019-04-13 DIAGNOSIS — R69 Illness, unspecified: Secondary | ICD-10-CM | POA: Diagnosis not present

## 2019-04-13 LAB — CBC WITH DIFFERENTIAL/PLATELET
Basophils Absolute: 0 10*3/uL (ref 0.0–0.2)
Basos: 1 %
EOS (ABSOLUTE): 0.2 10*3/uL (ref 0.0–0.4)
Eos: 3 %
Hematocrit: 46.1 % (ref 34.0–46.6)
Hemoglobin: 14 g/dL (ref 11.1–15.9)
Immature Grans (Abs): 0 10*3/uL (ref 0.0–0.1)
Immature Granulocytes: 0 %
Lymphocytes Absolute: 1.6 10*3/uL (ref 0.7–3.1)
Lymphs: 27 %
MCH: 25.7 pg — ABNORMAL LOW (ref 26.6–33.0)
MCHC: 30.4 g/dL — ABNORMAL LOW (ref 31.5–35.7)
MCV: 85 fL (ref 79–97)
Monocytes Absolute: 0.5 10*3/uL (ref 0.1–0.9)
Monocytes: 8 %
Neutrophils Absolute: 3.6 10*3/uL (ref 1.4–7.0)
Neutrophils: 61 %
Platelets: 379 10*3/uL (ref 150–450)
RBC: 5.44 x10E6/uL — ABNORMAL HIGH (ref 3.77–5.28)
RDW: 13.5 % (ref 11.7–15.4)
WBC: 6 10*3/uL (ref 3.4–10.8)

## 2019-04-13 LAB — CMP14+EGFR
ALT: 17 IU/L (ref 0–32)
AST: 16 IU/L (ref 0–40)
Albumin/Globulin Ratio: 1.5 (ref 1.2–2.2)
Albumin: 4.1 g/dL (ref 3.8–4.9)
Alkaline Phosphatase: 88 IU/L (ref 39–117)
BUN/Creatinine Ratio: 21 (ref 12–28)
BUN: 20 mg/dL (ref 8–27)
Bilirubin Total: 0.5 mg/dL (ref 0.0–1.2)
CO2: 22 mmol/L (ref 20–29)
Calcium: 9.2 mg/dL (ref 8.7–10.3)
Chloride: 106 mmol/L (ref 96–106)
Creatinine, Ser: 0.97 mg/dL (ref 0.57–1.00)
GFR calc Af Amer: 73 mL/min/{1.73_m2} (ref >59)
GFR calc non Af Amer: 64 mL/min/{1.73_m2} (ref >59)
Globulin, Total: 2.8 g/dL (ref 1.5–4.5)
Glucose: 82 mg/dL (ref 65–99)
Potassium: 4.1 mmol/L (ref 3.5–5.2)
Sodium: 141 mmol/L (ref 134–144)
Total Protein: 6.9 g/dL (ref 6.0–8.5)

## 2019-04-13 NOTE — Progress Notes (Signed)
Subjective:    Patient ID: Molly Castaneda, female    DOB: July 30, 1960, 59 y.o.   MRN: [id number]  Chief Complaint  Patient presents with  . Medical Management of Chronic Issues    6 mth    PT presents to the office todayfor chronic follow up. Pt is followed by Pain management every 3 months. She is currently taking ms contin and zanaflex.She has chronic left shoulder pain and left sided sciatica pain.   She is followed by Behavioral health every 3 weeks Klonopin and Adderall. Anxiety Presents for follow-up visit. Symptoms include depressed mood, excessive worry, irritability, nervous/anxious behavior and restlessness. Symptoms occur occasionally. The severity of symptoms is moderate.    Back Pain This is a chronic problem. The current episode started more than 1 year ago. The problem occurs intermittently. The problem has been waxing and waning since onset. The pain is present in the lumbar spine. The quality of the pain is described as aching. The pain radiates to the left thigh. The pain is at a severity of 6/10. The pain is moderate. Associated symptoms include leg pain and weakness. Pertinent negatives include no bladder incontinence or bowel incontinence. Risk factors include sedentary lifestyle. She has tried muscle relaxant, NSAIDs, analgesics and bed rest for the symptoms. The treatment provided moderate relief.  Constipation This is a chronic problem. The current episode started more than 1 year ago. The problem has been rapidly worsening since onset. Associated symptoms include back pain. Pertinent negatives include no bloating or diarrhea. Risk factors include obesity. She has tried laxatives for the symptoms. The treatment provided moderate relief.  Shoulder Pain  The pain is present in the left shoulder. This is a chronic problem. The current episode started more than 1 year ago. There has been a history of trauma. The problem occurs intermittently. The problem has been waxing  and waning. The quality of the pain is described as aching. The pain is at a severity of 3/10. The pain is mild. Associated symptoms include a limited range of motion.  Hyperlipidemia This is a chronic problem. The current episode started more than 1 year ago. The problem is controlled. Recent lipid tests were reviewed and are normal. Exacerbating diseases include obesity. Associated symptoms include leg pain. Current antihyperlipidemic treatment includes statins. Risk factors for coronary artery disease include dyslipidemia, hypertension, a sedentary lifestyle and post-menopausal.  Depression        This is a chronic problem.  The current episode started more than 1 year ago.   The onset quality is gradual.   Associated symptoms include helplessness, hopelessness, irritable, restlessness, decreased interest and sad.     The symptoms are aggravated by family issues.  Past medical history includes anxiety.   Osteopenia  Last Dexascan was 02/11/13, takes calcium and Vit D  daily.     Review of Systems  Constitutional: Positive for irritability.  Gastrointestinal: Positive for constipation. Negative for bloating, bowel incontinence and diarrhea.  Genitourinary: Negative for bladder incontinence.  Musculoskeletal: Positive for back pain.  Neurological: Positive for weakness.  Psychiatric/Behavioral: Positive for depression. The patient is nervous/anxious.   All other systems reviewed and are negative.      Objective:   Physical Exam Vitals reviewed.  Constitutional:      General: She is irritable. She is not in acute distress.    Appearance: She is well-developed. She is obese.  HENT:     Head: Normocephalic and atraumatic.     Right Ear: Tympanic membrane  normal.     Left Ear: Tympanic membrane normal.  Eyes:     Pupils: Pupils are equal, round, and reactive to light.  Neck:     Thyroid: No thyromegaly.  Cardiovascular:     Rate and Rhythm: Normal rate and regular rhythm.     Heart  sounds: Normal heart sounds. No murmur.  Pulmonary:     Effort: Pulmonary effort is normal. No respiratory distress.     Breath sounds: Normal breath sounds. No wheezing.  Abdominal:     General: Bowel sounds are normal. There is no distension.     Palpations: Abdomen is soft.     Tenderness: There is no abdominal tenderness.  Musculoskeletal:        General: No tenderness.     Cervical back: Normal range of motion and neck supple.     Right lower leg: Edema (trace) present.     Left lower leg: Edema (trace) present.  Skin:    General: Skin is warm and dry.  Neurological:     Mental Status: She is alert and oriented to person, place, and time.     Cranial Nerves: No cranial nerve deficit.     Deep Tendon Reflexes: Reflexes are normal and symmetric.  Psychiatric:        Behavior: Behavior normal.        Thought Content: Thought content normal.        Judgment: Judgment normal.       BP 123/74   Temp (!) 92 F (33.3 C) (Temporal)   Ht 6' (1.829 m)   Wt 226 lb 12.8 oz (102.9 kg)   SpO2 98%   BMI 30.76 kg/m      Assessment & Plan:  Molly Castaneda comes in today with chief complaint of Medical Management of Chronic Issues (6 mth )   Diagnosis and orders addressed:  1. Moderate episode of recurrent major depressive disorder (HCC) - CMP14+EGFR - CBC with Differential/Platelet  2. Drug-induced constipation - CMP14+EGFR - CBC with Differential/Platelet  3. Chronic left shoulder pain - CMP14+EGFR - CBC with Differential/Platelet  4. Left sided sciatica - CMP14+EGFR - CBC with Differential/Platelet  5. Osteopenia of multiple sites - DG [facility] DEXA - CMP14+EGFR - CBC with Differential/Platelet  6. Chronic left-sided low back pain with left-sided sciatica - CMP14+EGFR - CBC with Differential/Platelet  7. GAD (generalized anxiety disorder) - CMP14+EGFR - CBC with Differential/Platelet  8. Hyperlipidemia, unspecified hyperlipidemia type - CMP14+EGFR - CBC with  Differential/Platelet  9. Vitamin D deficiency - CMP14+EGFR - CBC with Differential/Platelet  10. Obesity (BMI 30.0-34.9) - CMP14+EGFR - CBC with Differential/Platelet  11. Colon cancer screening - Ambulatory referral to Gastroenterology - CMP14+EGFR - CBC with Differential/Platelet   Labs pending Health Maintenance reviewed Diet and exercise encouraged  Follow up plan: 6 months    Evelina Dun, FNP

## 2019-04-13 NOTE — Patient Instructions (Signed)
Health Maintenance After Age 61 After age 61, you are at a higher risk for certain long-term diseases and infections as well as injuries from falls. Falls are a major cause of broken bones and head injuries in people who are older than age 61. Getting regular preventive care can help to keep you healthy and well. Preventive care includes getting regular testing and making lifestyle changes as recommended by your health care provider. Talk with your health care provider about:  Which screenings and tests you should have. A screening is a test that checks for a disease when you have no symptoms.  A diet and exercise plan that is right for you. What should I know about screenings and tests to prevent falls? Screening and testing are the best ways to find a health problem early. Early diagnosis and treatment give you the best chance of managing medical conditions that are common after age 61. Certain conditions and lifestyle choices may make you more likely to have a fall. Your health care provider may recommend:  Regular vision checks. Poor vision and conditions such as cataracts can make you more likely to have a fall. If you wear glasses, make sure to get your prescription updated if your vision changes.  Medicine review. Work with your health care provider to regularly review all of the medicines you are taking, including over-the-counter medicines. Ask your health care provider about any side effects that may make you more likely to have a fall. Tell your health care provider if any medicines that you take make you feel dizzy or sleepy.  Osteoporosis screening. Osteoporosis is a condition that causes the bones to get weaker. This can make the bones weak and cause them to break more easily.  Blood pressure screening. Blood pressure changes and medicines to control blood pressure can make you feel dizzy.  Strength and balance checks. Your health care provider may recommend certain tests to check your  strength and balance while standing, walking, or changing positions.  Foot health exam. Foot pain and numbness, as well as not wearing proper footwear, can make you more likely to have a fall.  Depression screening. You may be more likely to have a fall if you have a fear of falling, feel emotionally low, or feel unable to do activities that you used to do.  Alcohol use screening. Using too much alcohol can affect your balance and may make you more likely to have a fall. What actions can I take to lower my risk of falls? General instructions  Talk with your health care provider about your risks for falling. Tell your health care provider if: ? You fall. Be sure to tell your health care provider about all falls, even ones that seem minor. ? You feel dizzy, sleepy, or off-balance.  Take over-the-counter and prescription medicines only as told by your health care provider. These include any supplements.  Eat a healthy diet and maintain a healthy weight. A healthy diet includes low-fat dairy products, low-fat (lean) meats, and fiber from whole grains, beans, and lots of fruits and vegetables. Home safety  Remove any tripping hazards, such as rugs, cords, and clutter.  Install safety equipment such as grab bars in bathrooms and safety rails on stairs.  Keep rooms and walkways well-lit. Activity   Follow a regular exercise program to stay fit. This will help you maintain your balance. Ask your health care provider what types of exercise are appropriate for you.  If you need a cane or   walker, use it as recommended by your health care provider.  Wear supportive shoes that have nonskid soles. Lifestyle  Do not drink alcohol if your health care provider tells you not to drink.  If you drink alcohol, limit how much you have: ? 0-1 drink a day for women. ? 0-2 drinks a day for men.  Be aware of how much alcohol is in your drink. In the [place] one drink equals one typical bottle of beer (12  oz), one-half glass of wine (5 oz), or one shot of hard liquor (1 oz).  Do not use any products that contain nicotine or tobacco, such as cigarettes and e-cigarettes. If you need help quitting, ask your health care provider. Summary  Having a healthy lifestyle and getting preventive care can help to protect your health and wellness after age 61.  Screening and testing are the best way to find a health problem early and help you avoid having a fall. Early diagnosis and treatment give you the best chance for managing medical conditions that are more common for people who are older than age 61.  Falls are a major cause of broken bones and head injuries in people who are older than age 61. Take precautions to prevent a fall at home.  Work with your health care provider to learn what changes you can make to improve your health and wellness and to prevent falls. This information is not intended to replace advice given to you by your health care provider. Make sure you discuss any questions you have with your health care provider. Document Revised: 05/15/2018 Document Reviewed: 12/05/2016 Elsevier Patient Education  2020 [employer]  

## 2019-04-17 ENCOUNTER — Encounter: Payer: Self-pay | Admitting: Gastroenterology

## 2019-04-20 DIAGNOSIS — F411 Generalized anxiety disorder: Secondary | ICD-10-CM | POA: Diagnosis not present

## 2019-04-20 DIAGNOSIS — R69 Illness, unspecified: Secondary | ICD-10-CM | POA: Diagnosis not present

## 2019-04-20 DIAGNOSIS — F332 Major depressive disorder, recurrent severe without psychotic features: Secondary | ICD-10-CM | POA: Diagnosis not present

## 2019-04-22 DIAGNOSIS — Z23 Encounter for immunization: Secondary | ICD-10-CM | POA: Diagnosis not present

## 2019-04-28 DIAGNOSIS — M961 Postlaminectomy syndrome, not elsewhere classified: Secondary | ICD-10-CM | POA: Diagnosis not present

## 2019-04-28 DIAGNOSIS — G8921 Chronic pain due to trauma: Secondary | ICD-10-CM | POA: Diagnosis not present

## 2019-04-28 DIAGNOSIS — M545 Low back pain: Secondary | ICD-10-CM | POA: Diagnosis not present

## 2019-04-28 DIAGNOSIS — G8929 Other chronic pain: Secondary | ICD-10-CM | POA: Diagnosis not present

## 2019-04-28 DIAGNOSIS — R69 Illness, unspecified: Secondary | ICD-10-CM | POA: Diagnosis not present

## 2019-05-06 DIAGNOSIS — R69 Illness, unspecified: Secondary | ICD-10-CM | POA: Diagnosis not present

## 2019-05-06 DIAGNOSIS — F411 Generalized anxiety disorder: Secondary | ICD-10-CM | POA: Diagnosis not present

## 2019-05-12 ENCOUNTER — Ambulatory Visit (AMBULATORY_SURGERY_CENTER): Payer: Self-pay

## 2019-05-12 ENCOUNTER — Other Ambulatory Visit: Payer: Self-pay

## 2019-05-12 VITALS — Temp 97.2°F | Ht 72.0 in | Wt 229.0 lb

## 2019-05-12 DIAGNOSIS — Z1211 Encounter for screening for malignant neoplasm of colon: Secondary | ICD-10-CM

## 2019-05-12 DIAGNOSIS — Z01818 Encounter for other preprocedural examination: Secondary | ICD-10-CM

## 2019-05-12 MED ORDER — SUTAB 1479-225-188 MG PO TABS: 12.0000 | ORAL_TABLET | ORAL | 0 refills | Status: DC

## 2019-05-12 NOTE — Progress Notes (Signed)
No egg or soy allergy known to patient  No issues with past sedation with any surgeries  or procedures, no intubation problems  No diet pills per patient No home 02 use per patient  No blood thinners per patient  Pt states issues with constipation  No A fib or A flutter  EMMI video sent to pt's e mail   2 Day prep with sutab and miralax  SuTab sample given, lot #[id number],  exp 04/2020  Due to the COVID-19 pandemic we are asking patients to follow these guidelines. Please only bring one care partner. Please be aware that your care partner may wait in the car in the parking lot or if they feel like they will be too hot to wait in the car, they may wait in the lobby on the 4th floor. All care partners are required to wear a mask the entire time (we do not have any that we can provide them), they need to practice social distancing, and we will do a Covid check for all patient's and care partners when you arrive. Also we will check their temperature and your temperature. If the care partner waits in their car they need to stay in the parking lot the entire time and we will call them on their cell phone when the patient is ready for discharge so they can bring the car to the front of the building. Also all patient's will need to wear a mask into building.

## 2019-05-20 DIAGNOSIS — Z23 Encounter for immunization: Secondary | ICD-10-CM | POA: Diagnosis not present

## 2019-05-21 ENCOUNTER — Other Ambulatory Visit (HOSPITAL_COMMUNITY)
Admission: RE | Admit: 2019-05-21 | Discharge: 2019-05-21 | Disposition: A | Discharge status: Home / Self Care | Payer: Medicare HMO | Source: Ambulatory Visit | Attending: Gastroenterology | Admitting: Gastroenterology

## 2019-05-21 ENCOUNTER — Other Ambulatory Visit: Payer: Self-pay

## 2019-05-21 DIAGNOSIS — Z20822 Contact with and (suspected) exposure to covid-19: Secondary | ICD-10-CM | POA: Diagnosis not present

## 2019-05-21 DIAGNOSIS — Z01812 Encounter for preprocedural laboratory examination: Secondary | ICD-10-CM | POA: Insufficient documentation

## 2019-05-22 ENCOUNTER — Encounter: Payer: Self-pay | Admitting: Gastroenterology

## 2019-05-22 ENCOUNTER — Other Ambulatory Visit: Payer: Self-pay | Admitting: Family

## 2019-05-22 DIAGNOSIS — F411 Generalized anxiety disorder: Secondary | ICD-10-CM

## 2019-05-22 DIAGNOSIS — F32A Depression, unspecified: Secondary | ICD-10-CM

## 2019-05-22 DIAGNOSIS — F329 Major depressive disorder, single episode, unspecified: Secondary | ICD-10-CM

## 2019-05-22 LAB — SARS CORONAVIRUS 2 (TAT 6-24 HRS): SARS Coronavirus 2: NEGATIVE

## 2019-05-22 NOTE — Telephone Encounter (Signed)
OV 04/13/19 rtc 6 mos

## 2019-05-26 ENCOUNTER — Ambulatory Visit (AMBULATORY_SURGERY_CENTER): Payer: Medicare HMO | Admitting: Gastroenterology

## 2019-05-26 ENCOUNTER — Other Ambulatory Visit: Payer: Self-pay

## 2019-05-26 ENCOUNTER — Encounter: Payer: Self-pay | Admitting: Gastroenterology

## 2019-05-26 ENCOUNTER — Other Ambulatory Visit: Payer: Self-pay | Admitting: Family

## 2019-05-26 VITALS — BP 90/66 | HR 78 | Temp 97.3°F | Resp 14 | Ht 72.0 in | Wt 229.0 lb

## 2019-05-26 DIAGNOSIS — Z8371 Family history of colonic polyps: Secondary | ICD-10-CM | POA: Diagnosis not present

## 2019-05-26 DIAGNOSIS — Z1211 Encounter for screening for malignant neoplasm of colon: Secondary | ICD-10-CM | POA: Diagnosis not present

## 2019-05-26 DIAGNOSIS — R609 Edema, unspecified: Secondary | ICD-10-CM

## 2019-05-26 MED ORDER — SODIUM CHLORIDE 0.9 % IV SOLN: 500.0000 mL | Freq: Once | INTRAVENOUS | Status: DC

## 2019-05-26 NOTE — Progress Notes (Signed)
Pt's states no medical or surgical changes since previsit or office visit.  Temp taken by LC VS taken by CW  

## 2019-05-26 NOTE — Op Note (Signed)
[facility] Patient Name: Molly Castaneda Procedure Date: 05/26/2019 10:55 AM MRN: [id number] Endoscopist: Meryl Dare , MD Age: 59 Referring MD:  Date of Birth: 01/04/1961 Gender: Female Account #: [id number] Procedure:                Colonoscopy Indications:              Colon cancer screening in patient at increased                            risk: Family history of 1st-degree relative with                            colon polyps Medicines:                Monitored Anesthesia Care Procedure:                Pre-Anesthesia Assessment:                           - Prior to the procedure, a History and Physical                            was performed, and patient medications and                            allergies were reviewed. The patient's tolerance of                            previous anesthesia was also reviewed. The risks                            and benefits of the procedure and the sedation                            options and risks were discussed with the patient.                            All questions were answered, and informed consent                            was obtained. Prior Anticoagulants: The patient has                            taken no previous anticoagulant or antiplatelet                            agents. ASA Grade Assessment: II - A patient with                            mild systemic disease. After reviewing the risks                            and benefits, the patient was deemed in  satisfactory condition to undergo the procedure.                           After obtaining informed consent, the colonoscope                            was passed under direct vision. Throughout the                            procedure, the patient's blood pressure, pulse, and                            oxygen saturations were monitored continuously. The                            Colonoscope was introduced through the anus and                           advanced to the the cecum, identified by                            appendiceal orifice and ileocecal valve. The                            ileocecal valve, appendiceal orifice, and rectum                            were photographed. The quality of the bowel                            preparation was excellent. The colonoscopy was                            performed without difficulty. The patient tolerated                            the procedure well. Scope In: 10:58:42 AM Scope Out: 11:15:04 AM Scope Withdrawal Time: 0 hours 10 minutes 10 seconds  Total Procedure Duration: 0 hours 16 minutes 22 seconds  Findings:                 The perianal and digital rectal examinations were                            normal.                           The entire examined colon appeared normal on direct                            and retroflexion views. Complications:            No immediate complications. Estimated blood loss:                            None. Estimated Blood Loss:     Estimated  blood loss: none. Impression:               - The entire examined colon is normal on direct and                            retroflexion views.                           - No specimens collected. Recommendation:           - Repeat colonoscopy in 5 years for screening                            purposes.                           - Patient has a contact number available for                            emergencies. The signs and symptoms of potential                            delayed complications were discussed with the                            patient. Return to normal activities tomorrow.                            Written discharge instructions were provided to the                            patient.                           - Resume previous diet.                           - Continue present medications. Meryl Dare, MD 05/26/2019 11:18:38 AM This report has been signed  electronically.

## 2019-05-26 NOTE — Patient Instructions (Signed)
YOU HAD AN ENDOSCOPIC PROCEDURE TODAY AT THE [facility]:   Refer to the procedure report that was given to you for any specific questions about what was found during the examination.  If the procedure report does not answer your questions, please call your gastroenterologist to clarify.  If you requested that your care partner not be given the details of your procedure findings, then the procedure report has been included in a sealed envelope for you to review at your convenience later.  YOU SHOULD EXPECT: Some feelings of bloating in the abdomen. Passage of more gas than usual.  Walking can help get rid of the air that was put into your GI tract during the procedure and reduce the bloating. If you had a lower endoscopy (such as a colonoscopy or flexible sigmoidoscopy) you may notice spotting of blood in your stool or on the toilet paper. If you underwent a bowel prep for your procedure, you may not have a normal bowel movement for a few days.  Please Note:  You might notice some irritation and congestion in your nose or some drainage.  This is from the oxygen used during your procedure.  There is no need for concern and it should clear up in a day or so.  SYMPTOMS TO REPORT IMMEDIATELY:   Following lower endoscopy (colonoscopy or flexible sigmoidoscopy):  Excessive amounts of blood in the stool  Significant tenderness or worsening of abdominal pains  Swelling of the abdomen that is new, acute  Fever of 100F or higher   For urgent or emergent issues, a gastroenterologist can be reached at any hour by calling [phone]. Do not use MyChart messaging for urgent concerns.    DIET:  We do recommend a small meal at first, but then you may proceed to your regular diet.  Drink plenty of fluids but you should avoid alcoholic beverages for 24 hours.  MEDICATIONS: Continue present medications.  ACTIVITY:  You should plan to take it easy for the rest of today and you should NOT DRIVE  or use heavy machinery until tomorrow (because of the sedation medicines used during the test).    FOLLOW UP: Our staff will call the number listed on your records 48-72 hours following your procedure to check on you and address any questions or concerns that you may have regarding the information given to you following your procedure. If we do not reach you, we will leave a message.  We will attempt to reach you two times.  During this call, we will ask if you have developed any symptoms of COVID 19. If you develop any symptoms (ie: fever, flu-like symptoms, shortness of breath, cough etc.) before then, please call [phone].  If you test positive for Covid 19 in the 2 weeks post procedure, please call and report this information to us.    If any biopsies were taken you will be contacted by phone or by letter within the next 1-3 weeks.  Please call us at [phone] if you have not heard about the biopsies in 3 weeks.   Thank you for allowing us to provide for your healthcare needs today.  SIGNATURES/CONFIDENTIALITY: You and/or your care partner have signed paperwork which will be entered into your electronic medical record.  These signatures attest to the fact that that the information above on your After Visit Summary has been reviewed and is understood.  Full responsibility of the confidentiality of this discharge information lies with you and/or your care-partner. 

## 2019-05-26 NOTE — Progress Notes (Signed)
PT taken to PACU. Monitors in place. VSS. Report given to RN. 

## 2019-05-28 ENCOUNTER — Telehealth: Payer: Self-pay

## 2019-05-28 NOTE — Telephone Encounter (Signed)
  Follow up Call-  Call back number 05/26/2019  Post procedure Call Back phone  # [phone]  Permission to leave phone message Yes  Some recent data might be hidden     Patient questions:  Do you have a fever, pain , or abdominal swelling? No. Pain Score  0 *  Have you tolerated food without any problems? Yes.    Have you been able to return to your normal activities? Yes.    Do you have any questions about your discharge instructions: Diet   No. Medications  No. Follow up visit  No.  Do you have questions or concerns about your Care? No.  Actions: * If pain score is 4 or above: No action needed, pain <4.  1. Have you developed a fever since your procedure? no  2.   Have you had an respiratory symptoms (SOB or cough) since your procedure? no  3.   Have you tested positive for COVID 19 since your procedure no  4.   Have you had any family members/close contacts diagnosed with the COVID 19 since your procedure?  no   If yes to any of these questions please route to Laverna Peace, RN and Charlett Lango, RN

## 2019-06-08 DIAGNOSIS — F411 Generalized anxiety disorder: Secondary | ICD-10-CM | POA: Diagnosis not present

## 2019-06-08 DIAGNOSIS — F332 Major depressive disorder, recurrent severe without psychotic features: Secondary | ICD-10-CM | POA: Diagnosis not present

## 2019-06-08 DIAGNOSIS — R69 Illness, unspecified: Secondary | ICD-10-CM | POA: Diagnosis not present

## 2019-06-18 ENCOUNTER — Other Ambulatory Visit: Payer: Self-pay | Admitting: Family

## 2019-06-18 ENCOUNTER — Encounter: Payer: Self-pay | Admitting: Family

## 2019-06-18 ENCOUNTER — Ambulatory Visit (INDEPENDENT_AMBULATORY_CARE_PROVIDER_SITE_OTHER): Payer: Medicare HMO | Admitting: Family

## 2019-06-18 DIAGNOSIS — M8949 Other hypertrophic osteoarthropathy, multiple sites: Secondary | ICD-10-CM

## 2019-06-18 DIAGNOSIS — J209 Acute bronchitis, unspecified: Secondary | ICD-10-CM | POA: Diagnosis not present

## 2019-06-18 DIAGNOSIS — G8929 Other chronic pain: Secondary | ICD-10-CM

## 2019-06-18 DIAGNOSIS — M159 Polyosteoarthritis, unspecified: Secondary | ICD-10-CM

## 2019-06-18 DIAGNOSIS — M542 Cervicalgia: Secondary | ICD-10-CM

## 2019-06-18 MED ORDER — PREDNISONE 10 MG (21) PO TBPK: ORAL_TABLET | ORAL | 0 refills | Status: DC

## 2019-06-18 NOTE — Progress Notes (Signed)
   Virtual Visit via telephone Note Due to COVID-19 pandemic this visit was conducted virtually. This visit type was conducted due to national recommendations for restrictions regarding the COVID-19 Pandemic (e.g. social distancing, sheltering in place) in an effort to limit this patient's exposure and mitigate transmission in our community. All issues noted in this document were discussed and addressed.  A physical exam was not performed with this format.  I connected with Molly Castaneda on 06/18/19 at 8:35 AM by telephone and verified that I am speaking with the correct person using two identifiers. Molly Castaneda is currently located at home and no one is currently with her during visit. The provider, [name], FNP is located in their office at time of visit.  I discussed the limitations, risks, security and privacy concerns of performing an evaluation and management service by telephone and the availability of in person appointments. I also discussed with the patient that there may be a patient responsible charge related to this service. The patient expressed understanding and agreed to proceed.   History and Present Illness:  Cough This is a new problem. The current episode started in the past 7 days. The problem has been gradually worsening. The problem occurs every few minutes. The cough is productive of sputum. Associated symptoms include postnasal drip, rhinorrhea, a sore throat and wheezing. Pertinent negatives include no chills, ear congestion, ear pain, fever, myalgias, nasal congestion or shortness of breath. She has tried rest for the symptoms. The treatment provided mild relief.      Review of Systems  Constitutional: Negative for chills and fever.  HENT: Positive for postnasal drip, rhinorrhea and sore throat. Negative for ear pain.   Respiratory: Positive for cough and wheezing. Negative for shortness of breath.   Musculoskeletal: Negative for myalgias.  All other systems  reviewed and are negative.    Observations/Objective: No SOB or distress noted  Assessment and Plan: 1. Acute bronchitis, unspecified organism - Take meds as prescribed - Use a cool mist humidifier  -Use saline nose sprays frequently -Force fluids -For any cough or congestion  Use plain Mucinex- regular strength or max strength is fine -For fever or aces or pains- take tylenol or ibuprofen. -Throat lozenges if help -RTO if symptoms worsen or do not improve  - predniSONE (STERAPRED UNI-PAK 21 TAB) 10 MG (21) TBPK tablet; Use as directed  Dispense: 21 tablet; Refill: 0     I discussed the assessment and treatment plan with the patient. The patient was provided an opportunity to ask questions and all were answered. The patient agreed with the plan and demonstrated an understanding of the instructions.   The patient was advised to call back or seek an in-person evaluation if the symptoms worsen or if the condition fails to improve as anticipated.  The above assessment and management plan was discussed with the patient. The patient verbalized understanding of and has agreed to the management plan. Patient is aware to call the clinic if symptoms persist or worsen. Patient is aware when to return to the clinic for a follow-up visit. Patient educated on when it is appropriate to go to the emergency department.   Time call ended: 8:48 AM    I provided 13 minutes of non-face-to-face time during this encounter.    [name], FNP

## 2019-06-19 ENCOUNTER — Telehealth: Payer: Self-pay | Admitting: Family

## 2019-06-19 MED ORDER — BENZONATATE 200 MG PO CAPS: 200.0000 mg | ORAL_CAPSULE | Freq: Three times a day (TID) | ORAL | 1 refills | Status: DC | PRN

## 2019-06-19 NOTE — Telephone Encounter (Signed)
Script for cough medication?

## 2019-06-19 NOTE — Telephone Encounter (Signed)
Prescription sent to pharmacy.

## 2019-07-14 DIAGNOSIS — K5909 Other constipation: Secondary | ICD-10-CM | POA: Insufficient documentation

## 2019-07-29 DIAGNOSIS — M961 Postlaminectomy syndrome, not elsewhere classified: Secondary | ICD-10-CM | POA: Diagnosis not present

## 2019-07-29 DIAGNOSIS — M545 Low back pain: Secondary | ICD-10-CM | POA: Diagnosis not present

## 2019-07-29 DIAGNOSIS — G8929 Other chronic pain: Secondary | ICD-10-CM | POA: Diagnosis not present

## 2019-07-29 DIAGNOSIS — R69 Illness, unspecified: Secondary | ICD-10-CM | POA: Diagnosis not present

## 2019-07-31 ENCOUNTER — Other Ambulatory Visit: Payer: Self-pay | Admitting: Family

## 2019-07-31 DIAGNOSIS — M542 Cervicalgia: Secondary | ICD-10-CM

## 2019-07-31 DIAGNOSIS — M8949 Other hypertrophic osteoarthropathy, multiple sites: Secondary | ICD-10-CM

## 2019-07-31 DIAGNOSIS — R609 Edema, unspecified: Secondary | ICD-10-CM

## 2019-07-31 DIAGNOSIS — G8929 Other chronic pain: Secondary | ICD-10-CM

## 2019-07-31 DIAGNOSIS — M159 Polyosteoarthritis, unspecified: Secondary | ICD-10-CM

## 2019-08-03 DIAGNOSIS — F411 Generalized anxiety disorder: Secondary | ICD-10-CM | POA: Diagnosis not present

## 2019-08-03 DIAGNOSIS — F332 Major depressive disorder, recurrent severe without psychotic features: Secondary | ICD-10-CM | POA: Diagnosis not present

## 2019-08-03 DIAGNOSIS — R69 Illness, unspecified: Secondary | ICD-10-CM | POA: Diagnosis not present

## 2019-08-11 DIAGNOSIS — F332 Major depressive disorder, recurrent severe without psychotic features: Secondary | ICD-10-CM | POA: Diagnosis not present

## 2019-08-11 DIAGNOSIS — F411 Generalized anxiety disorder: Secondary | ICD-10-CM | POA: Diagnosis not present

## 2019-08-11 DIAGNOSIS — R69 Illness, unspecified: Secondary | ICD-10-CM | POA: Diagnosis not present

## 2019-08-24 DIAGNOSIS — F332 Major depressive disorder, recurrent severe without psychotic features: Secondary | ICD-10-CM | POA: Diagnosis not present

## 2019-08-24 DIAGNOSIS — F411 Generalized anxiety disorder: Secondary | ICD-10-CM | POA: Diagnosis not present

## 2019-08-24 DIAGNOSIS — R69 Illness, unspecified: Secondary | ICD-10-CM | POA: Diagnosis not present

## 2019-08-31 ENCOUNTER — Other Ambulatory Visit: Payer: Self-pay | Admitting: Family

## 2019-08-31 DIAGNOSIS — Z1231 Encounter for screening mammogram for malignant neoplasm of breast: Secondary | ICD-10-CM

## 2019-09-03 DIAGNOSIS — R69 Illness, unspecified: Secondary | ICD-10-CM | POA: Diagnosis not present

## 2019-09-03 DIAGNOSIS — F332 Major depressive disorder, recurrent severe without psychotic features: Secondary | ICD-10-CM | POA: Diagnosis not present

## 2019-09-03 DIAGNOSIS — F411 Generalized anxiety disorder: Secondary | ICD-10-CM | POA: Diagnosis not present

## 2019-09-08 ENCOUNTER — Ambulatory Visit
Admission: RE | Admit: 2019-09-08 | Discharge: 2019-09-08 | Disposition: A | Discharge status: Home / Self Care | Payer: Medicare HMO | Source: Ambulatory Visit | Attending: Family | Admitting: Family

## 2019-09-08 ENCOUNTER — Other Ambulatory Visit: Payer: Self-pay

## 2019-09-08 DIAGNOSIS — Z1231 Encounter for screening mammogram for malignant neoplasm of breast: Secondary | ICD-10-CM | POA: Diagnosis not present

## 2019-09-24 DIAGNOSIS — F411 Generalized anxiety disorder: Secondary | ICD-10-CM | POA: Diagnosis not present

## 2019-09-24 DIAGNOSIS — F332 Major depressive disorder, recurrent severe without psychotic features: Secondary | ICD-10-CM | POA: Diagnosis not present

## 2019-09-24 DIAGNOSIS — R69 Illness, unspecified: Secondary | ICD-10-CM | POA: Diagnosis not present

## 2019-10-22 ENCOUNTER — Ambulatory Visit (INDEPENDENT_AMBULATORY_CARE_PROVIDER_SITE_OTHER): Payer: Medicare HMO

## 2019-10-22 DIAGNOSIS — Z0000 Encounter for general adult medical examination without abnormal findings: Secondary | ICD-10-CM

## 2019-10-22 NOTE — Progress Notes (Signed)
MEDICARE ANNUAL WELLNESS VISIT  10/22/2019  Telephone Visit Disclaimer This Medicare AWV was conducted by telephone due to national recommendations for restrictions regarding the COVID-19 Pandemic (e.g. social distancing).  I verified, using two identifiers, that I am speaking with Molly Castaneda or their authorized healthcare agent. I discussed the limitations, risks, security, and privacy concerns of performing an evaluation and management service by telephone and the potential availability of an in-person appointment in the future. The patient expressed understanding and agreed to proceed.  Location of Patient: Home Location of Provider (nurse):  [facility]  Subjective:    Molly Castaneda is a 59 y.o. female patient of Hawks, Theador Hawthorne, FNP who had a [employer] Visit today via telephone. Molly Castaneda is Disabled and lives with their spouse.  She has two children and four grandchildren. She  reports that she is socially active and does interact with friends/family regularly. She is minimally physically active and enjoys working in flowers.  Patient Care Team: Sharion Balloon, FNP as PCP - General (Family Medicine) Lomax, Marny Lowenstein, MD (Inactive) as Attending Physician (Obstetrics and Gynecology) McCain, Evette Cristal, FNP (Nurse Practitioner) Weyman Rodney, MD as Referring Physician (Psychiatry)  Advanced Directives 10/22/2019 06/17/2018 01/14/2018 09/11/2017 05/28/2017 04/02/2017 09/11/2016  Does Patient Have a Medical Advance Directive? No Yes No No No No No  Type of Advance Directive - Healthcare Power of [place];Living will - - - - -  Copy of [place] in Chart? - No - copy requested - - - - -  Would patient like information on creating a medical advance directive? No - Patient declined - - - No - Patient declined - [facility] Utilization Over the Past 12 Months: # of hospitalizations or ER visits: 0 # of surgeries: 0  Review of Systems    Patient reports  that her overall health is unchanged compared to last year.  History obtained from chart review and the patient  Patient Reported Readings (BP, Pulse, CBG, Weight, etc) none  Pain Assessment Pain : 0-10 Pain Score: 8  Pain Type: Chronic pain Pain Location: Back Pain Orientation: Lower Pain Radiating Towards: left leg into foot Pain Descriptors / Indicators: Constant, Aching, Tingling, Burning, Spasm, Numbness Pain Onset: More than a month ago Pain Frequency: Constant Pain Relieving Factors: pain medication  Pain Relieving Factors: pain medication  Current Medications & Allergies (verified) Allergies as of 10/22/2019      Reactions   Oxycodone-acetaminophen Other (See Comments)   Chest Pains Patient can tolerate acetaminophen   Oxycontin [oxycodone Hcl] Other (See Comments)   Causes chest pain   Hydrocodone Other (See Comments)      Medication List       Accurate as of October 22, 2019  1:22 PM. If you have any questions, ask your nurse or doctor.        STOP taking these medications   benzonatate 200 MG capsule Commonly known as: TESSALON   predniSONE 10 MG (21) Tbpk tablet Commonly known as: STERAPRED UNI-PAK 21 TAB     TAKE these medications   amphetamine-dextroamphetamine 10 MG tablet Commonly known as: ADDERALL Take 10 mg by mouth 2 (two) times daily with a meal.   aspirin 81 MG tablet Take 81 mg by mouth daily.   buPROPion 150 MG 12 hr tablet Commonly known as: WELLBUTRIN SR TAKE 1 TABLET BY MOUTH TWICE A DAY   Calcium Carbonate 500 MG Chew Chew 1 tablet (500 mg  total) by mouth daily.   citalopram 20 MG tablet Commonly known as: CELEXA Take 1 tablet (20 mg total) by mouth daily.   clonazePAM 0.25 MG disintegrating tablet Commonly known as: KLONOPIN Take 0.25 mg by mouth 2 (two) times daily as needed.   diclofenac 75 MG EC tablet Commonly known as: VOLTAREN TAKE 1 TABLET BY MOUTH TWICE A DAY   estradiol 1 MG tablet Commonly known as:  ESTRACE Take 1 tablet (1 mg total) by mouth daily.   fluticasone 50 MCG/ACT nasal spray Commonly known as: FLONASE Place 2 sprays into both nostrils daily.   furosemide 40 MG tablet Commonly known as: LASIX TAKE 1 TABLET BY MOUTH EVERY DAY   Metamucil 0.36 g Caps Generic drug: Psyllium Take 1 capsule by mouth daily.   mirtazapine 15 MG tablet Commonly known as: REMERON Take 15 mg by mouth at bedtime.   morphine 15 MG 12 hr tablet Commonly known as: MS CONTIN Take 15 mg by mouth 3 (three) times daily.   naloxegol oxalate 25 MG Tabs tablet Commonly known as: Movantik Take 1 tablet (25 mg total) by mouth daily.   Narcan 4 MG/0.1ML Liqd nasal spray kit Generic drug: naloxone Narcan 4 mg/actuation nasal spray   Omega 3-6-9 Complex Caps Take 1 capsule by mouth daily.   prazosin 1 MG capsule Commonly known as: MINIPRESS   tiZANidine 4 MG tablet Commonly known as: ZANAFLEX Take 1 Tablet by mouth at bedtime as needed FOR MUSCLE SPASMS   topiramate 50 MG tablet Commonly known as: TOPAMAX Take 50 mg by mouth 2 (two) times daily.   vitamin B-12 1000 MCG tablet Commonly known as: CYANOCOBALAMIN Take 1,000 mcg by mouth daily.   Vitamin D (Cholecalciferol) 25 MCG (1000 UT) Tabs Take 1,000 Units by mouth daily.       History (reviewed): Past Medical History:  Diagnosis Date  . Arthritis   . Chronic back pain   . Chronic constipation   . Depression   . MVP (mitral valve prolapse)    had ablation 1998  . Numbness in left leg    s/p spinal surgery  . Supraventricular tachycardia ([place])    ablation 1998  . Vitamin D deficiency    Past Surgical History:  Procedure Laterality Date  . [place]   for svt  . BACK SURGERY  2001   lumbar fusion  . COLONOSCOPY  2010  . PARTIAL HYSTERECTOMY  1993  . REPAIR EXTENSOR TENDON Left 03/06/2013   Procedure: LEFT LONG BOUTONNIERE REPAIR;  Surgeon: Cammie Sickle., MD;  Location: [facility];   Service: Orthopedics;  Laterality: Left;  . SPINE SURGERY    . stimulation system implant  07/31/00   lumbar nerve stimulator-none funtioning now   Family History  Problem Relation Age of Onset  . Coronary artery disease Father   . Colon polyps Father   . Prostate cancer Maternal Grandfather   . Hodgkin's lymphoma Son   . Liver cancer Paternal Uncle        ?  Marland Kitchen Colon polyps Paternal Uncle   . Arthritis Mother   . Colon cancer Neg Hx   . Esophageal cancer Neg Hx   . Rectal cancer Neg Hx   . Stomach cancer Neg Hx    Social History   Socioeconomic History  . Marital status: Married    Spouse name: Not on file  . Number of children: 2  . Years of education: Not on file  .  Highest education level: High school graduate  Occupational History  . Occupation: disabled    Fish farm manager: UNEMPLOYED  Tobacco Use  . Smoking status: Never Smoker  . Smokeless tobacco: Never Used  Vaping Use  . Vaping Use: Never used  Substance and Sexual Activity  . Alcohol use: No  . Drug use: No  . Sexual activity: Yes  Other Topics Concern  . Not on file  Social History Narrative  . Not on file   Social Determinants of Health   Financial Resource Strain:   . Difficulty of Paying Living Expenses: Not on file  Food Insecurity:   . Worried About Charity fundraiser in the Last Year: Not on file  . Ran Out of Food in the Last Year: Not on file  Transportation Needs:   . Lack of Transportation (Medical): Not on file  . Lack of Transportation (Non-Medical): Not on file  Physical Activity:   . Days of Exercise per Week: Not on file  . Minutes of Exercise per Session: Not on file  Stress:   . Feeling of Stress : Not on file  Social Connections:   . Frequency of Communication with Friends and Family: Not on file  . Frequency of Social Gatherings with Friends and Family: Not on file  . Attends Religious Services: Not on file  . Active Member of Clubs or Organizations: Not on file  . Attends English as a second language teacher Meetings: Not on file  . Marital Status: Not on file    Activities of Daily Living In your present state of health, do you have any difficulty performing the following activities: 10/22/2019  Hearing? N  Vision? N  Difficulty concentrating or making decisions? N  Walking or climbing stairs? Y  Comment back and leg pain  Dressing or bathing? N  Doing errands, shopping? N  Preparing Food and eating ? N  Using the Toilet? N  In the past six months, have you accidently leaked urine? N  Do you have problems with loss of bowel control? N  Managing your Medications? N  Managing your Finances? N  Housekeeping or managing your Housekeeping? N  Some recent data might be hidden   Patient reports she does have problems with walking long distances and climbing stairs.  It causes her to have pain in her back and legs.  Patient Education/ Literacy How often do you need to have someone help you when you read instructions, pamphlets, or other written materials from your doctor or pharmacy?: 1 - Never What is the last grade level you completed in school?: 12th grade  Exercise Current Exercise Habits: The patient does not participate in regular exercise at present, Exercise limited by: orthopedic condition(s)  Diet Patient reports consuming 2 meals a day and 0 snack(s) a day Patient reports that her primary diet is: Regular Patient reports that she does have regular access to food.   Depression Screen PHQ 2/9 Scores 10/22/2019 04/13/2019 06/17/2018 03/13/2018 12/24/2017 12/10/2017 09/04/2017  PHQ - 2 Score 1 6 4 6 6 6  -  PHQ- 9 Score - 23 19 19 21 21  -  Exception Documentation - - - - - (No Data) Other- indicate reason in comment box  Not completed - - - - - - Patient goes for counseling at [facility].     Fall Risk Fall Risk  10/22/2019 04/13/2019 06/17/2018 09/04/2017 06/04/2017  Falls in the past year? 1 0 1 No Yes  Number falls in past yr: 1 - 1 -  2 or more  Injury with  Fall? 0 - 0 - Yes  Comment - - - - -  Risk for fall due to : History of fall(s);Impaired mobility - Impaired balance/gait - -  Follow up Falls evaluation completed - Education provided;Falls prevention discussed - -     Objective:  Molly Castaneda seemed alert and oriented and she participated appropriately during our telephone visit.  Blood Pressure Weight BMI  BP Readings from Last 3 Encounters:  05/26/19 90/66  04/13/19 123/74  04/23/18 107/72   Wt Readings from Last 3 Encounters:  05/26/19 229 lb (103.9 kg)  05/12/19 229 lb (103.9 kg)  04/13/19 226 lb 12.8 oz (102.9 kg)   BMI Readings from Last 1 Encounters:  05/26/19 31.06 kg/m    *Unable to obtain current vital signs, weight, and BMI due to telephone visit type  Hearing/Vision  . Molly Castaneda did not seem to have difficulty with hearing/understanding during the telephone conversation . Reports that she has not had a formal eye exam by an eye care professional within the past year . Reports that she has not had a formal hearing evaluation within the past year *Unable to fully assess hearing and vision during telephone visit type  Cognitive Function: 6CIT Screen 10/22/2019 06/17/2018  What Year? 0 points 0 points  What month? 0 points 0 points  What time? 0 points 0 points  Count back from 20 0 points 0 points  Months in reverse 0 points 0 points  Repeat phrase 0 points 2 points  Total Score 0 2   (Normal:0-7, Significant for Dysfunction: >8)  Normal Cognitive Function Screening: Yes   Immunization & Health Maintenance Record Immunization History  Administered Date(s) Administered  . Influenza Split 10/31/2012  . Influenza,inj,Quad PF,6+ Mos 11/11/2014, 12/14/2015, 12/04/2017, 10/30/2018  . Influenza,inj,quad, With Preservative 12/03/2016  . Influenza-Unspecified 11/01/2013, 11/06/2014, 03/05/2017, 11/05/2017, 12/04/2017  . Tdap 10/19/2011    Health Maintenance  Topic Date Due  . COVID-19 Vaccine (1) Never done  .  DEXA SCAN  04/12/2021  . MAMMOGRAM  09/07/2021  . TETANUS/TDAP  10/18/2021  . COLONOSCOPY  05/25/2024  . Hepatitis C Screening  Completed  . HIV Screening  Completed       Assessment  This is a routine wellness examination for Molly Castaneda.  Health Maintenance: Due or Overdue Health Maintenance Due  Topic Date Due  . COVID-19 Vaccine (1) Never done    Molly Castaneda does not need a referral for Community Assistance: Care Management:   no Social Work:    no Prescription Assistance:  no Nutrition/Diabetes Education:  no   Plan:  Personalized Goals Goals Addressed            This Visit's Progress   . Patient Stated       10/22/2019 AWV Goal: Exercise for General Health   Patient will verbalize understanding of the benefits of increased physical activity:  Exercising regularly is important. It will improve your overall fitness, flexibility, and endurance.  Regular exercise also will improve your overall health. It can help you control your weight, reduce stress, and improve your bone density.  Over the next year, patient will increase physical activity as tolerated with a goal of at least 150 minutes of moderate physical activity per week.   You can tell that you are exercising at a moderate intensity if your heart starts beating faster and you start breathing faster but can still hold a conversation.  Moderate-intensity exercise ideas include:  Walking 1 mile (1.6 km) in about 15 minutes  Biking  Hiking  Golfing  Dancing  Water aerobics  Patient will verbalize understanding of everyday activities that increase physical activity by providing examples like the following: ? Yard work, such as: ? Pushing a Conservation officer, nature ? Raking and bagging leaves ? Washing your car ? Pushing a stroller ? Shoveling snow ? Gardening ? Washing windows or floors  Patient will be able to explain general safety guidelines for exercising:   Before you start a new exercise  program, talk with your health care provider.  Do not exercise so much that you hurt yourself, feel dizzy, or get very short of breath.  Wear comfortable clothes and wear shoes with good support.  Drink plenty of water while you exercise to prevent dehydration or heat stroke.  Work out until your breathing and your heartbeat get faster.       Personalized Health Maintenance & Screening Recommendations  Pneumococcal vaccine  Screening Pap smear and pelvic exam   Lung Cancer Screening Recommended: no (Low Dose CT Chest recommended if Age 64-80 years, 30 pack-year currently smoking OR have quit w/in past 15 years) Hepatitis C Screening recommended: no HIV Screening recommended: no  Advanced Directives: Written information was not prepared per patient's request.  Referrals & Orders No orders of the defined types were placed in this encounter.   Follow-up Plan . Follow-up with Sharion Balloon, FNP as planned . Schedule eye exam    I have personally reviewed and noted the following in the patient's chart:   . Medical and social history . Use of alcohol, tobacco or illicit drugs  . Current medications and supplements . Functional ability and status . Nutritional status . Physical activity . Advanced directives . List of other physicians . Hospitalizations, surgeries, and ER visits in previous 12 months . Vitals . Screenings to include cognitive, depression, and falls . Referrals and appointments  In addition, I have reviewed and discussed with Molly Castaneda certain preventive protocols, quality metrics, and best practice recommendations. A written personalized care plan for preventive services as well as general preventive health recommendations is available and can be mailed to the patient at her request.      Felicity Coyer, LPN    [id number]

## 2019-10-28 DIAGNOSIS — G8929 Other chronic pain: Secondary | ICD-10-CM | POA: Diagnosis not present

## 2019-10-28 DIAGNOSIS — Z5181 Encounter for therapeutic drug level monitoring: Secondary | ICD-10-CM | POA: Diagnosis not present

## 2019-10-28 DIAGNOSIS — M5442 Lumbago with sciatica, left side: Secondary | ICD-10-CM | POA: Diagnosis not present

## 2019-10-28 DIAGNOSIS — M961 Postlaminectomy syndrome, not elsewhere classified: Secondary | ICD-10-CM | POA: Diagnosis not present

## 2019-10-28 DIAGNOSIS — Z79891 Long term (current) use of opiate analgesic: Secondary | ICD-10-CM | POA: Diagnosis not present

## 2019-11-02 DIAGNOSIS — R69 Illness, unspecified: Secondary | ICD-10-CM | POA: Diagnosis not present

## 2019-11-02 DIAGNOSIS — F332 Major depressive disorder, recurrent severe without psychotic features: Secondary | ICD-10-CM | POA: Diagnosis not present

## 2019-11-02 DIAGNOSIS — F411 Generalized anxiety disorder: Secondary | ICD-10-CM | POA: Diagnosis not present

## 2019-11-04 ENCOUNTER — Other Ambulatory Visit: Payer: Self-pay | Admitting: Family

## 2019-11-04 DIAGNOSIS — R609 Edema, unspecified: Secondary | ICD-10-CM

## 2019-11-05 ENCOUNTER — Other Ambulatory Visit: Payer: Self-pay | Admitting: Family

## 2019-11-05 DIAGNOSIS — M159 Polyosteoarthritis, unspecified: Secondary | ICD-10-CM

## 2019-11-05 DIAGNOSIS — M8949 Other hypertrophic osteoarthropathy, multiple sites: Secondary | ICD-10-CM

## 2019-11-05 DIAGNOSIS — M5442 Lumbago with sciatica, left side: Secondary | ICD-10-CM

## 2019-11-05 DIAGNOSIS — M542 Cervicalgia: Secondary | ICD-10-CM

## 2019-11-05 DIAGNOSIS — G8929 Other chronic pain: Secondary | ICD-10-CM

## 2019-11-17 ENCOUNTER — Encounter: Payer: Self-pay | Admitting: Family Medicine

## 2019-11-17 ENCOUNTER — Ambulatory Visit (INDEPENDENT_AMBULATORY_CARE_PROVIDER_SITE_OTHER): Payer: Medicare HMO | Admitting: Family Medicine

## 2019-11-17 DIAGNOSIS — J069 Acute upper respiratory infection, unspecified: Secondary | ICD-10-CM

## 2019-11-17 DIAGNOSIS — R059 Cough, unspecified: Secondary | ICD-10-CM

## 2019-11-17 NOTE — Progress Notes (Signed)
Virtual Visit via Telephone Note  I connected with Molly Castaneda on 11/17/19 at 9:17 AM by telephone and verified that I am speaking with the correct person using two identifiers. Molly Castaneda is currently located at home and nobody is currently with her during this visit. The provider, Loman Brooklyn, FNP is located in their home at time of visit.  I discussed the limitations, risks, security and privacy concerns of performing an evaluation and management service by telephone and the availability of in person appointments. I also discussed with the patient that there may be a patient responsible charge related to this service. The patient expressed understanding and agreed to proceed.  Subjective: PCP: Sharion Balloon, FNP  Chief Complaint  Patient presents with  . URI   Patient complains of cough, sore throat and body aches. Additional symptoms include headache and ear pain/pressure. Onset of symptoms was 2 days ago, gradually worsening since that time. She is drinking plenty of fluids and urinating per her usual. Evaluation to date: none. Treatment to date: cough suppressants, decongestants and Cold & Flu.  She does not smoke.  Patient reports she has had both of her COVID-19 vaccines.  She is hoping to feel better by Monday as her husband is scheduled for a colonoscopy that day.   ROS: Per HPI  Current Outpatient Medications:  .  amphetamine-dextroamphetamine (ADDERALL) 10 MG tablet, Take 10 mg by mouth 2 (two) times daily with a meal. , Disp: , Rfl:  .  aspirin 81 MG tablet, Take 81 mg by mouth daily., Disp: , Rfl:  .  buPROPion (WELLBUTRIN SR) 150 MG 12 hr tablet, TAKE 1 TABLET BY MOUTH TWICE A DAY, Disp: 180 tablet, Rfl: 1 .  Calcium Carbonate 500 MG CHEW, Chew 1 tablet (500 mg total) by mouth daily., Disp: 30 each, Rfl:  .  citalopram (CELEXA) 20 MG tablet, Take 1 tablet (20 mg total) by mouth daily. (Patient not taking: Reported on 10/22/2019), Disp: 90 tablet, Rfl: 2 .   clonazePAM (KLONOPIN) 0.25 MG disintegrating tablet, Take 0.25 mg by mouth 2 (two) times daily as needed. , Disp: , Rfl:  .  diclofenac (VOLTAREN) 75 MG EC tablet, TAKE 1 TABLET BY MOUTH TWICE A DAY, Disp: 180 tablet, Rfl: 0 .  estradiol (ESTRACE) 1 MG tablet, Take 1 tablet (1 mg total) by mouth daily., Disp: 90 tablet, Rfl: 1 .  fluticasone (FLONASE) 50 MCG/ACT nasal spray, Place 2 sprays into both nostrils daily., Disp: 16 g, Rfl: 6 .  furosemide (LASIX) 40 MG tablet, Take 1 tablet (40 mg total) by mouth daily. (Needs to be seen before next refill), Disp: 30 tablet, Rfl: 0 .  mirtazapine (REMERON) 15 MG tablet, Take 15 mg by mouth at bedtime., Disp: , Rfl:  .  morphine (MS CONTIN) 15 MG 12 hr tablet, Take 15 mg by mouth 3 (three) times daily., Disp: , Rfl: 0 .  naloxegol oxalate (MOVANTIK) 25 MG TABS tablet, Take 1 tablet (25 mg total) by mouth daily., Disp: 90 tablet, Rfl: 2 .  naloxone (NARCAN) 4 MG/0.1ML LIQD nasal spray kit, Narcan 4 mg/actuation nasal spray, Disp: , Rfl:  .  Omega 3-6-9 Fatty Acids (OMEGA 3-6-9 COMPLEX) CAPS, Take 1 capsule by mouth daily.  , Disp: , Rfl:  .  prazosin (MINIPRESS) 1 MG capsule, , Disp: , Rfl:  .  Psyllium (METAMUCIL) 0.36 g CAPS, Take 1 capsule by mouth daily., Disp: , Rfl:  .  tiZANidine (ZANAFLEX) 4 MG  tablet, Take 1 Tablet by mouth at bedtime as needed FOR MUSCLE SPASMS, Disp: 60 tablet, Rfl: 5 .  topiramate (TOPAMAX) 50 MG tablet, Take 50 mg by mouth 2 (two) times daily., Disp: , Rfl:  .  vitamin B-12 (CYANOCOBALAMIN) 1000 MCG tablet, Take 1,000 mcg by mouth daily.  , Disp: , Rfl:  .  Vitamin D, Cholecalciferol, 1000 units TABS, Take 1,000 Units by mouth daily., Disp: 60 tablet, Rfl:   Current Facility-Administered Medications:  .  0.9 %  sodium chloride infusion, 500 mL, Intravenous, Once, Ladene Artist, MD  Allergies  Allergen Reactions  . Oxycodone-Acetaminophen Other (See Comments)    Chest Pains Patient can tolerate acetaminophen  .  Oxycontin [Oxycodone Hcl] Other (See Comments)    Causes chest pain  . Hydrocodone Other (See Comments)   Past Medical History:  Diagnosis Date  . Arthritis   . Chronic back pain   . Chronic constipation   . Depression   . MVP (mitral valve prolapse)    had ablation 1998  . Numbness in left leg    s/p spinal surgery  . Supraventricular tachycardia ([place])    ablation 1998  . Vitamin D deficiency     Observations/Objective: A&O  No respiratory distress or wheezing audible over the phone Mood, judgement, and thought processes all WNL  Assessment and Plan: 1. Viral upper respiratory tract infection - Discussed symptom management and expected duration of viral illnesses.  2. Cough - Novel Coronavirus, NAA ([facility]); Future   Follow Up Instructions:  I discussed the assessment and treatment plan with the patient. The patient was provided an opportunity to ask questions and all were answered. The patient agreed with the plan and demonstrated an understanding of the instructions.   The patient was advised to call back or seek an in-person evaluation if the symptoms worsen or if the condition fails to improve as anticipated.  The above assessment and management plan was discussed with the patient. The patient verbalized understanding of and has agreed to the management plan. Patient is aware to call the clinic if symptoms persist or worsen. Patient is aware when to return to the clinic for a follow-up visit. Patient educated on when it is appropriate to go to the emergency department.   Time call ended: 9:29 AM  I provided 14 minutes of non-face-to-face time during this encounter.  Hendricks Limes, MSN, APRN, FNP-C [facility] 11/17/19

## 2019-11-17 NOTE — Addendum Note (Signed)
Addended by: Prescott Gum on: 11/17/2019 01:08 PM   Modules accepted: Orders

## 2019-11-19 ENCOUNTER — Telehealth: Payer: Self-pay

## 2019-11-19 LAB — SARS-COV-2, NAA 2 DAY TAT

## 2019-11-19 LAB — NOVEL CORONAVIRUS, NAA: SARS-CoV-2, NAA: NOT DETECTED

## 2019-11-19 MED ORDER — AZITHROMYCIN 250 MG PO TABS: ORAL_TABLET | ORAL | 0 refills | Status: DC

## 2019-11-19 NOTE — Telephone Encounter (Signed)
Patient aware and verbalizes understanding. 

## 2019-11-19 NOTE — Telephone Encounter (Signed)
Zpak Prescription sent to pharmacy   

## 2019-11-19 NOTE — Telephone Encounter (Signed)
Patient done first of this week with b. Molly Castaneda. She was told to take otc meds. Patient states she did do COVID test patient states she feels wrose. She is wheezing with a little rattle with cough she is feeling it is hard to breath. Left eye pain. No fever nose is burning. Patient declines chills, body aches. Patient states she is feeling fatigued. Patient asking for Neysa Bonito to send [facility]

## 2019-11-20 ENCOUNTER — Other Ambulatory Visit: Payer: Self-pay | Admitting: Family

## 2019-11-20 DIAGNOSIS — F32A Depression, unspecified: Secondary | ICD-10-CM

## 2019-11-20 DIAGNOSIS — F411 Generalized anxiety disorder: Secondary | ICD-10-CM

## 2019-11-23 MED ORDER — BUPROPION HCL ER (SR) 150 MG PO TB12: 150.0000 mg | ORAL_TABLET | Freq: Two times a day (BID) | ORAL | 0 refills | Status: DC

## 2019-11-23 NOTE — Addendum Note (Signed)
Addended by: Julious Payer D on: 11/23/2019 09:25 AM   Modules accepted: Orders

## 2019-11-23 NOTE — Telephone Encounter (Signed)
E-prescribe down. resent 

## 2019-11-27 ENCOUNTER — Other Ambulatory Visit: Payer: Self-pay | Admitting: Family

## 2019-11-27 DIAGNOSIS — R609 Edema, unspecified: Secondary | ICD-10-CM

## 2019-12-20 ENCOUNTER — Other Ambulatory Visit: Payer: Self-pay | Admitting: Family

## 2019-12-20 DIAGNOSIS — R609 Edema, unspecified: Secondary | ICD-10-CM

## 2019-12-20 DIAGNOSIS — F411 Generalized anxiety disorder: Secondary | ICD-10-CM

## 2019-12-20 DIAGNOSIS — F32A Depression, unspecified: Secondary | ICD-10-CM

## 2019-12-21 DIAGNOSIS — B351 Tinea unguium: Secondary | ICD-10-CM | POA: Diagnosis not present

## 2019-12-21 DIAGNOSIS — M79671 Pain in right foot: Secondary | ICD-10-CM | POA: Diagnosis not present

## 2019-12-21 DIAGNOSIS — L600 Ingrowing nail: Secondary | ICD-10-CM | POA: Diagnosis not present

## 2019-12-21 DIAGNOSIS — M79672 Pain in left foot: Secondary | ICD-10-CM | POA: Diagnosis not present

## 2019-12-21 DIAGNOSIS — M79675 Pain in left toe(s): Secondary | ICD-10-CM | POA: Diagnosis not present

## 2019-12-21 DIAGNOSIS — M79674 Pain in right toe(s): Secondary | ICD-10-CM | POA: Diagnosis not present

## 2019-12-21 DIAGNOSIS — I739 Peripheral vascular disease, unspecified: Secondary | ICD-10-CM | POA: Diagnosis not present

## 2019-12-21 NOTE — Telephone Encounter (Signed)
[facility]. NTBS 30 days given 11/23/19

## 2019-12-23 ENCOUNTER — Other Ambulatory Visit: Payer: Self-pay | Admitting: Family

## 2019-12-23 DIAGNOSIS — G8929 Other chronic pain: Secondary | ICD-10-CM

## 2019-12-23 DIAGNOSIS — M542 Cervicalgia: Secondary | ICD-10-CM

## 2019-12-23 DIAGNOSIS — M8949 Other hypertrophic osteoarthropathy, multiple sites: Secondary | ICD-10-CM

## 2019-12-23 DIAGNOSIS — M159 Polyosteoarthritis, unspecified: Secondary | ICD-10-CM

## 2020-01-11 DIAGNOSIS — F332 Major depressive disorder, recurrent severe without psychotic features: Secondary | ICD-10-CM | POA: Diagnosis not present

## 2020-01-11 DIAGNOSIS — F411 Generalized anxiety disorder: Secondary | ICD-10-CM | POA: Diagnosis not present

## 2020-01-11 DIAGNOSIS — R69 Illness, unspecified: Secondary | ICD-10-CM | POA: Diagnosis not present

## 2020-01-19 DIAGNOSIS — R69 Illness, unspecified: Secondary | ICD-10-CM | POA: Diagnosis not present

## 2020-01-27 DIAGNOSIS — R200 Anesthesia of skin: Secondary | ICD-10-CM | POA: Diagnosis not present

## 2020-01-27 DIAGNOSIS — M79605 Pain in left leg: Secondary | ICD-10-CM | POA: Diagnosis not present

## 2020-01-27 DIAGNOSIS — M961 Postlaminectomy syndrome, not elsewhere classified: Secondary | ICD-10-CM | POA: Diagnosis not present

## 2020-01-27 DIAGNOSIS — Z79899 Other long term (current) drug therapy: Secondary | ICD-10-CM | POA: Diagnosis not present

## 2020-01-27 DIAGNOSIS — M79604 Pain in right leg: Secondary | ICD-10-CM | POA: Diagnosis not present

## 2020-01-27 DIAGNOSIS — R531 Weakness: Secondary | ICD-10-CM | POA: Diagnosis not present

## 2020-02-01 DIAGNOSIS — Z6831 Body mass index (BMI) 31.0-31.9, adult: Secondary | ICD-10-CM | POA: Diagnosis not present

## 2020-02-01 DIAGNOSIS — Z01419 Encounter for gynecological examination (general) (routine) without abnormal findings: Secondary | ICD-10-CM | POA: Diagnosis not present

## 2020-02-01 DIAGNOSIS — N959 Unspecified menopausal and perimenopausal disorder: Secondary | ICD-10-CM | POA: Diagnosis not present

## 2020-02-09 DIAGNOSIS — F332 Major depressive disorder, recurrent severe without psychotic features: Secondary | ICD-10-CM | POA: Diagnosis not present

## 2020-02-09 DIAGNOSIS — R69 Illness, unspecified: Secondary | ICD-10-CM | POA: Diagnosis not present

## 2020-02-09 DIAGNOSIS — F411 Generalized anxiety disorder: Secondary | ICD-10-CM | POA: Diagnosis not present

## 2020-02-10 ENCOUNTER — Other Ambulatory Visit: Payer: Self-pay | Admitting: Family

## 2020-02-10 DIAGNOSIS — F411 Generalized anxiety disorder: Secondary | ICD-10-CM

## 2020-02-10 DIAGNOSIS — F32A Depression, unspecified: Secondary | ICD-10-CM

## 2020-02-18 ENCOUNTER — Other Ambulatory Visit: Payer: Medicare HMO

## 2020-02-18 DIAGNOSIS — Z20822 Contact with and (suspected) exposure to covid-19: Secondary | ICD-10-CM | POA: Diagnosis not present

## 2020-02-21 LAB — NOVEL CORONAVIRUS, NAA: SARS-CoV-2, NAA: NOT DETECTED

## 2020-02-24 DIAGNOSIS — F411 Generalized anxiety disorder: Secondary | ICD-10-CM | POA: Diagnosis not present

## 2020-02-24 DIAGNOSIS — R69 Illness, unspecified: Secondary | ICD-10-CM | POA: Diagnosis not present

## 2020-02-24 DIAGNOSIS — F332 Major depressive disorder, recurrent severe without psychotic features: Secondary | ICD-10-CM | POA: Diagnosis not present

## 2020-03-01 ENCOUNTER — Other Ambulatory Visit: Payer: Self-pay | Admitting: Family

## 2020-03-01 DIAGNOSIS — M5442 Lumbago with sciatica, left side: Secondary | ICD-10-CM

## 2020-03-01 DIAGNOSIS — M159 Polyosteoarthritis, unspecified: Secondary | ICD-10-CM

## 2020-03-01 DIAGNOSIS — M542 Cervicalgia: Secondary | ICD-10-CM

## 2020-03-01 DIAGNOSIS — M8949 Other hypertrophic osteoarthropathy, multiple sites: Secondary | ICD-10-CM

## 2020-03-01 DIAGNOSIS — G8929 Other chronic pain: Secondary | ICD-10-CM

## 2020-03-15 DIAGNOSIS — F332 Major depressive disorder, recurrent severe without psychotic features: Secondary | ICD-10-CM | POA: Diagnosis not present

## 2020-03-15 DIAGNOSIS — F411 Generalized anxiety disorder: Secondary | ICD-10-CM | POA: Diagnosis not present

## 2020-03-15 DIAGNOSIS — R69 Illness, unspecified: Secondary | ICD-10-CM | POA: Diagnosis not present

## 2020-03-30 ENCOUNTER — Other Ambulatory Visit: Payer: Self-pay | Admitting: Family

## 2020-03-30 DIAGNOSIS — M5442 Lumbago with sciatica, left side: Secondary | ICD-10-CM

## 2020-03-30 DIAGNOSIS — M8949 Other hypertrophic osteoarthropathy, multiple sites: Secondary | ICD-10-CM

## 2020-03-30 DIAGNOSIS — M542 Cervicalgia: Secondary | ICD-10-CM

## 2020-03-30 DIAGNOSIS — G8929 Other chronic pain: Secondary | ICD-10-CM

## 2020-03-30 DIAGNOSIS — M159 Polyosteoarthritis, unspecified: Secondary | ICD-10-CM

## 2020-04-11 ENCOUNTER — Encounter: Payer: Self-pay | Admitting: Family Medicine

## 2020-04-11 ENCOUNTER — Telehealth (INDEPENDENT_AMBULATORY_CARE_PROVIDER_SITE_OTHER): Payer: Medicare HMO | Admitting: Family Medicine

## 2020-04-11 DIAGNOSIS — J0100 Acute maxillary sinusitis, unspecified: Secondary | ICD-10-CM | POA: Diagnosis not present

## 2020-04-11 MED ORDER — BENZONATATE 100 MG PO CAPS
100.0000 mg | ORAL_CAPSULE | Freq: Two times a day (BID) | ORAL | 0 refills | Status: DC | PRN
Start: 2020-04-11 — End: 2020-05-12

## 2020-04-11 MED ORDER — AZITHROMYCIN 250 MG PO TABS: ORAL_TABLET | ORAL | 0 refills | Status: DC

## 2020-04-11 MED ORDER — FLUTICASONE PROPIONATE 50 MCG/ACT NA SUSP
1.0000 | Freq: Two times a day (BID) | NASAL | 6 refills | Status: DC | PRN
Start: 2020-04-11 — End: 2020-11-07

## 2020-04-11 NOTE — Progress Notes (Signed)
Virtual Visit via Mychart VideoNote  I connected with Molly Castaneda on 04/11/20 at [facility] by video and verified that I am speaking with the correct person using two identifiers. Molly Castaneda is currently located at home and patient are currently with her during visit. The provider, Fransisca Kaufmann Dettinger, MD is located in their office at time of visit.  Call ended at [phone]  I discussed the limitations, risks, security and privacy concerns of performing an evaluation and management service by video and the availability of in person appointments. I also discussed with the patient that there may be a patient responsible charge related to this service. The patient expressed understanding and agreed to proceed.   History and Present Illness: Patient is calling in for sinus pressure and headache.  This started about 4 days ago and she has been getting sore throat and feeling hoarse.  She has swelling on the side of face and jaw.  She denies any fevers.  She has postnasal drip and denies sick contacts. She denies shortness of breath or wheezing.   No diagnosis found.  Outpatient Encounter Medications as of 04/11/2020  Medication Sig  . amphetamine-dextroamphetamine (ADDERALL) 10 MG tablet Take 10 mg by mouth 2 (two) times daily with a meal.   . aspirin 81 MG tablet Take 81 mg by mouth daily.  Marland Kitchen azithromycin (ZITHROMAX) 250 MG tablet Take 500 mg once, then 250 mg for four days  . buPROPion (WELLBUTRIN SR) 150 MG 12 hr tablet Take 1 tablet (150 mg total) by mouth 2 (two) times daily. (Needs to be seen before next refill)  . Calcium Carbonate 500 MG CHEW Chew 1 tablet (500 mg total) by mouth daily.  . citalopram (CELEXA) 20 MG tablet Take 1 tablet (20 mg total) by mouth daily. (Patient not taking: Reported on 10/22/2019)  . clonazePAM (KLONOPIN) 0.25 MG disintegrating tablet Take 0.25 mg by mouth 2 (two) times daily as needed.   . diclofenac (VOLTAREN) 75 MG EC tablet TAKE 1 TABLET BY MOUTH TWICE A DAY  .  estradiol (ESTRACE) 1 MG tablet Take 1 tablet (1 mg total) by mouth daily.  . fluticasone (FLONASE) 50 MCG/ACT nasal spray Place 2 sprays into both nostrils daily.  . furosemide (LASIX) 40 MG tablet TAKE 1 TABLET (40 MG TOTAL) BY MOUTH DAILY. (NEEDS TO BE SEEN BEFORE NEXT REFILL)  . mirtazapine (REMERON) 15 MG tablet Take 15 mg by mouth at bedtime.  Marland Kitchen morphine (MS CONTIN) 15 MG 12 hr tablet Take 15 mg by mouth 3 (three) times daily.  . naloxegol oxalate (MOVANTIK) 25 MG TABS tablet Take 1 tablet (25 mg total) by mouth daily.  . naloxone (NARCAN) 4 MG/0.1ML LIQD nasal spray kit Narcan 4 mg/actuation nasal spray  . Omega 3-6-9 Fatty Acids (OMEGA 3-6-9 COMPLEX) CAPS Take 1 capsule by mouth daily.    . prazosin (MINIPRESS) 1 MG capsule   . Psyllium (METAMUCIL) 0.36 g CAPS Take 1 capsule by mouth daily.  Marland Kitchen tiZANidine (ZANAFLEX) 4 MG tablet Take 1 Tablet by mouth at bedtime as needed FOR MUSCLE SPASMS  . topiramate (TOPAMAX) 50 MG tablet Take 50 mg by mouth 2 (two) times daily.  . vitamin B-12 (CYANOCOBALAMIN) 1000 MCG tablet Take 1,000 mcg by mouth daily.    . Vitamin D, Cholecalciferol, 1000 units TABS Take 1,000 Units by mouth daily.   Facility-Administered Encounter Medications as of 04/11/2020  Medication  . 0.9 %  sodium chloride infusion    Review of Systems  Constitutional: Negative for fever.  HENT: Positive for congestion, postnasal drip, sinus pressure, sneezing and sore throat. Negative for ear discharge, ear pain and rhinorrhea.   Eyes: Negative for pain, redness and visual disturbance.  Respiratory: Positive for cough. Negative for chest tightness, shortness of breath and wheezing.   Cardiovascular: Negative for chest pain and leg swelling.  Genitourinary: Negative for difficulty urinating and dysuria.  Musculoskeletal: Negative for back pain and gait problem.  Skin: Negative for rash.  Neurological: Negative for light-headedness and headaches.  Psychiatric/Behavioral: Negative  for agitation and behavioral problems.  All other systems reviewed and are negative.   Observations/Objective: Patient sounds comfortable and in no acute distress  Assessment and Plan: Problem List Items Addressed This Visit   None   Visit Diagnoses    Acute non-recurrent maxillary sinusitis    -  Primary   Relevant Medications   fluticasone (FLONASE) 50 MCG/ACT nasal spray   azithromycin (ZITHROMAX) 250 MG tablet   benzonatate (TESSALON) 100 MG capsule       Follow up plan: Return if symptoms worsen or fail to improve.     I discussed the assessment and treatment plan with the patient. The patient was provided an opportunity to ask questions and all were answered. The patient agreed with the plan and demonstrated an understanding of the instructions.   The patient was advised to call back or seek an in-person evaluation if the symptoms worsen or if the condition fails to improve as anticipated.  The above assessment and management plan was discussed with the patient. The patient verbalized understanding of and has agreed to the management plan. Patient is aware to call the clinic if symptoms persist or worsen. Patient is aware when to return to the clinic for a follow-up visit. Patient educated on when it is appropriate to go to the emergency department.    I provided 11 minutes of non-face-to-face time during this encounter.    Worthy Rancher, MD

## 2020-05-04 DIAGNOSIS — R69 Illness, unspecified: Secondary | ICD-10-CM | POA: Diagnosis not present

## 2020-05-04 DIAGNOSIS — F411 Generalized anxiety disorder: Secondary | ICD-10-CM | POA: Diagnosis not present

## 2020-05-04 DIAGNOSIS — F332 Major depressive disorder, recurrent severe without psychotic features: Secondary | ICD-10-CM | POA: Diagnosis not present

## 2020-05-06 ENCOUNTER — Ambulatory Visit: Payer: Medicare HMO | Admitting: Nurse Practitioner

## 2020-05-09 DIAGNOSIS — M961 Postlaminectomy syndrome, not elsewhere classified: Secondary | ICD-10-CM | POA: Diagnosis not present

## 2020-05-09 DIAGNOSIS — Z5181 Encounter for therapeutic drug level monitoring: Secondary | ICD-10-CM | POA: Diagnosis not present

## 2020-05-09 DIAGNOSIS — Z79891 Long term (current) use of opiate analgesic: Secondary | ICD-10-CM | POA: Diagnosis not present

## 2020-05-09 DIAGNOSIS — F411 Generalized anxiety disorder: Secondary | ICD-10-CM | POA: Diagnosis not present

## 2020-05-09 DIAGNOSIS — R69 Illness, unspecified: Secondary | ICD-10-CM | POA: Diagnosis not present

## 2020-05-09 DIAGNOSIS — F332 Major depressive disorder, recurrent severe without psychotic features: Secondary | ICD-10-CM | POA: Diagnosis not present

## 2020-05-09 DIAGNOSIS — M5442 Lumbago with sciatica, left side: Secondary | ICD-10-CM | POA: Diagnosis not present

## 2020-05-09 DIAGNOSIS — G8929 Other chronic pain: Secondary | ICD-10-CM | POA: Diagnosis not present

## 2020-05-12 ENCOUNTER — Encounter: Payer: Self-pay | Admitting: Family

## 2020-05-12 ENCOUNTER — Other Ambulatory Visit: Payer: Self-pay

## 2020-05-12 ENCOUNTER — Ambulatory Visit (INDEPENDENT_AMBULATORY_CARE_PROVIDER_SITE_OTHER): Payer: Medicare HMO | Admitting: Family

## 2020-05-12 VITALS — BP 112/71 | HR 79 | Temp 97.6°F | Ht 72.0 in | Wt 212.3 lb

## 2020-05-12 DIAGNOSIS — B351 Tinea unguium: Secondary | ICD-10-CM

## 2020-05-12 DIAGNOSIS — M5432 Sciatica, left side: Secondary | ICD-10-CM | POA: Diagnosis not present

## 2020-05-12 DIAGNOSIS — K5903 Drug induced constipation: Secondary | ICD-10-CM | POA: Diagnosis not present

## 2020-05-12 DIAGNOSIS — M7502 Adhesive capsulitis of left shoulder: Secondary | ICD-10-CM

## 2020-05-12 DIAGNOSIS — M542 Cervicalgia: Secondary | ICD-10-CM | POA: Diagnosis not present

## 2020-05-12 DIAGNOSIS — E559 Vitamin D deficiency, unspecified: Secondary | ICD-10-CM | POA: Diagnosis not present

## 2020-05-12 DIAGNOSIS — E663 Overweight: Secondary | ICD-10-CM

## 2020-05-12 DIAGNOSIS — R69 Illness, unspecified: Secondary | ICD-10-CM | POA: Diagnosis not present

## 2020-05-12 DIAGNOSIS — F411 Generalized anxiety disorder: Secondary | ICD-10-CM

## 2020-05-12 DIAGNOSIS — E785 Hyperlipidemia, unspecified: Secondary | ICD-10-CM | POA: Diagnosis not present

## 2020-05-12 DIAGNOSIS — K5909 Other constipation: Secondary | ICD-10-CM

## 2020-05-12 MED ORDER — TERBINAFINE HCL 250 MG PO TABS: 250.0000 mg | ORAL_TABLET | Freq: Every day | ORAL | 0 refills | Status: DC

## 2020-05-12 NOTE — Progress Notes (Signed)
Subjective:    Patient ID: Molly Castaneda, female    DOB: 1960/05/21, 60 y.o.   MRN: [id number]  Chief Complaint  Patient presents with  . Congestive Heart Failure    Patients wants to be checked for chf    PT presents to the office todayfor chronic follow up. Pt is followed by Pain management every 3 months. She is currently taking ms contin.She has chronic left shoulder pain and left sided sciatica pain.   She is followed by Behavioral health every 3 weeks Klonopin and Adderall.  She is complaining of toenail thickness and discoloration on bilateral foot that has been on going for years.  Constipation This is a chronic problem. The current episode started more than 1 year ago. The problem has been waxing and waning since onset. She has tried laxatives for the symptoms. The treatment provided moderate relief.  Hyperlipidemia This is a chronic problem. The current episode started more than 1 year ago. Exacerbating diseases include obesity. Current antihyperlipidemic treatment includes diet change. The current treatment provides mild improvement of lipids.  Anxiety Presents for follow-up visit. Symptoms include excessive worry, irritability, nervous/anxious behavior and restlessness. Symptoms occur most days. The severity of symptoms is moderate.    Depression        This is a chronic problem.  The current episode started more than 1 year ago.   The onset quality is gradual.   The problem occurs intermittently.  Associated symptoms include irritable, restlessness and sad.  Associated symptoms include no helplessness and no hopelessness.  Past medical history includes anxiety.       Review of Systems  Constitutional: Positive for irritability.  Gastrointestinal: Positive for constipation.  Psychiatric/Behavioral: Positive for depression. The patient is nervous/anxious.   All other systems reviewed and are negative.      Objective:   Physical Exam Vitals reviewed.   Constitutional:      General: She is irritable. She is not in acute distress.    Appearance: She is well-developed.  HENT:     Head: Normocephalic and atraumatic.     Right Ear: Tympanic membrane normal.     Left Ear: Tympanic membrane normal.  Eyes:     Pupils: Pupils are equal, round, and reactive to light.  Neck:     Thyroid: No thyromegaly.  Cardiovascular:     Rate and Rhythm: Normal rate and regular rhythm.     Heart sounds: Normal heart sounds. No murmur heard.   Pulmonary:     Effort: Pulmonary effort is normal. No respiratory distress.     Breath sounds: Normal breath sounds. No wheezing.  Abdominal:     General: Bowel sounds are normal. There is no distension.     Palpations: Abdomen is soft.     Tenderness: There is no abdominal tenderness.  Musculoskeletal:        General: No tenderness. Normal range of motion.     Cervical back: Normal range of motion and neck supple.  Skin:    General: Skin is warm and dry.     Comments: Bilateral toenail thickness  Neurological:     Mental Status: She is alert and oriented to person, place, and time.     Cranial Nerves: No cranial nerve deficit.     Deep Tendon Reflexes: Reflexes are normal and symmetric.  Psychiatric:        Behavior: Behavior normal.        Thought Content: Thought content normal.  Judgment: Judgment normal.       BP 112/71   Pulse 79   Temp 97.6 F (36.4 C) (Temporal)   Ht 6' (1.829 m)   Wt 212 lb 4.8 oz (96.3 kg)   SpO2 99%   BMI 28.79 kg/m      Assessment & Plan:  Molly Castaneda comes in today with chief complaint of Congestive Heart Failure (Patients wants to be checked for chf)   Diagnosis and orders addressed:  1. Chronic constipation - CBC with Differential/Platelet - BMP8+EGFR - Hepatic function panel  2. Left sided sciatica - CBC with Differential/Platelet - BMP8+EGFR - Hepatic function panel  3. Adhesive capsulitis of left shoulder - CBC with  Differential/Platelet - BMP8+EGFR - Hepatic function panel  4. Drug-induced constipation - CBC with Differential/Platelet - BMP8+EGFR - Hepatic function panel  5. GAD (generalized anxiety disorder) - CBC with Differential/Platelet - BMP8+EGFR - Hepatic function panel  6. Vitamin D deficiency - CBC with Differential/Platelet - BMP8+EGFR - Hepatic function panel - VITAMIN D 25 Hydroxy (Vit-D Deficiency, Fractures)  7. Overweight (BMI 25.0-29.9) - CBC with Differential/Platelet - BMP8+EGFR - Hepatic function panel  8. Cervicalgia - CBC with Differential/Platelet - BMP8+EGFR - Hepatic function panel  9. Hyperlipidemia, unspecified hyperlipidemia type  - CBC with Differential/Platelet - BMP8+EGFR - Hepatic function panel  10. Onychomycosis Started on Terbinafine  - CBC with Differential/Platelet - BMP8+EGFR - Hepatic function panel - terbinafine (LAMISIL) 250 MG tablet; Take 1 tablet (250 mg total) by mouth daily.  Dispense: 90 tablet; Refill: 0   Labs pending Health Maintenance reviewed Diet and exercise encouraged  Follow up plan: 3 months   Evelina Dun, FNP

## 2020-05-12 NOTE — Patient Instructions (Signed)

## 2020-05-13 LAB — CBC WITH DIFFERENTIAL/PLATELET
Basophils Absolute: 0 10*3/uL (ref 0.0–0.2)
Basos: 1 %
EOS (ABSOLUTE): 0.1 10*3/uL (ref 0.0–0.4)
Eos: 3 %
Hematocrit: 43.6 % (ref 34.0–46.6)
Hemoglobin: 13.7 g/dL (ref 11.1–15.9)
Immature Grans (Abs): 0 10*3/uL (ref 0.0–0.1)
Immature Granulocytes: 0 %
Lymphocytes Absolute: 1.2 10*3/uL (ref 0.7–3.1)
Lymphs: 29 %
MCH: 27.5 pg (ref 26.6–33.0)
MCHC: 31.4 g/dL — ABNORMAL LOW (ref 31.5–35.7)
MCV: 87 fL (ref 79–97)
Monocytes Absolute: 0.4 10*3/uL (ref 0.1–0.9)
Monocytes: 9 %
Neutrophils Absolute: 2.3 10*3/uL (ref 1.4–7.0)
Neutrophils: 58 %
Platelets: 296 10*3/uL (ref 150–450)
RBC: 4.99 x10E6/uL (ref 3.77–5.28)
RDW: 13.4 % (ref 11.7–15.4)
WBC: 4 10*3/uL (ref 3.4–10.8)

## 2020-05-13 LAB — BMP8+EGFR
BUN/Creatinine Ratio: 18 (ref 12–28)
BUN: 15 mg/dL (ref 8–27)
CO2: 21 mmol/L (ref 20–29)
Calcium: 9 mg/dL (ref 8.7–10.3)
Chloride: 105 mmol/L (ref 96–106)
Creatinine, Ser: 0.85 mg/dL (ref 0.57–1.00)
Glucose: 84 mg/dL (ref 65–99)
Potassium: 4.2 mmol/L (ref 3.5–5.2)
Sodium: 140 mmol/L (ref 134–144)
eGFR: 78 mL/min/{1.73_m2} (ref >59)

## 2020-05-13 LAB — HEPATIC FUNCTION PANEL
ALT: 14 IU/L (ref 0–32)
AST: 14 IU/L (ref 0–40)
Albumin: 4.2 g/dL (ref 3.8–4.8)
Alkaline Phosphatase: 77 IU/L (ref 44–121)
Bilirubin Total: 0.8 mg/dL (ref 0.0–1.2)
Bilirubin, Direct: 0.18 mg/dL (ref 0.00–0.40)
Total Protein: 6.7 g/dL (ref 6.0–8.5)

## 2020-05-13 LAB — VITAMIN D 25 HYDROXY (VIT D DEFICIENCY, FRACTURES): Vit D, 25-Hydroxy: 31.5 ng/mL (ref 30.0–100.0)

## 2020-05-19 ENCOUNTER — Encounter: Payer: Self-pay | Admitting: Family Medicine

## 2020-05-19 ENCOUNTER — Ambulatory Visit (INDEPENDENT_AMBULATORY_CARE_PROVIDER_SITE_OTHER): Payer: Medicare HMO | Admitting: Family Medicine

## 2020-05-19 ENCOUNTER — Telehealth: Payer: Self-pay

## 2020-05-19 DIAGNOSIS — J019 Acute sinusitis, unspecified: Secondary | ICD-10-CM | POA: Diagnosis not present

## 2020-05-19 MED ORDER — AMOXICILLIN-POT CLAVULANATE 875-125 MG PO TABS: 1.0000 | ORAL_TABLET | Freq: Two times a day (BID) | ORAL | 0 refills | Status: AC

## 2020-05-19 MED ORDER — PREDNISONE 10 MG (21) PO TBPK
ORAL_TABLET | ORAL | 0 refills | Status: DC
Start: 2020-05-19 — End: 2020-07-19

## 2020-05-19 NOTE — Progress Notes (Signed)
Virtual Visit via Telephone Note  I connected with Molly Castaneda on 05/19/20 at 1:46 PM by telephone and verified that I am speaking with the correct person using two identifiers. Molly Castaneda is currently located at home and nobody is currently with her during this visit. The provider, Gwenlyn Fudge, FNP is located in their office at time of visit.  I discussed the limitations, risks, security and privacy concerns of performing an evaluation and management service by telephone and the availability of in person appointments. I also discussed with the patient that there may be a patient responsible charge related to this service. The patient expressed understanding and agreed to proceed.  Subjective: PCP: [name], FNP  Chief Complaint  Patient presents with  . URI   Patient complains of cough, runny nose, facial pain/pressure, postnasal drainage and no voice. Onset of symptoms was 1 day ago, gradually worsening since that time. She is drinking plenty of fluids. Evaluation to date: none. Treatment to date: antihistamine, zine, and sinus tablet. She does not smoke.    ROS: Per HPI  Current Outpatient Medications:  .  amphetamine-dextroamphetamine (ADDERALL) 10 MG tablet, Take 10 mg by mouth 2 (two) times daily with a meal. , Disp: , Rfl:  .  buPROPion (WELLBUTRIN SR) 150 MG 12 hr tablet, Take 1 tablet (150 mg total) by mouth 2 (two) times daily. (Needs to be seen before next refill), Disp: 60 tablet, Rfl: 0 .  Calcium Carbonate 500 MG CHEW, Chew 1 tablet (500 mg total) by mouth daily. (Patient not taking: No sig reported), Disp: 30 each, Rfl:  .  clonazePAM (KLONOPIN) 0.25 MG disintegrating tablet, Take 0.25 mg by mouth 2 (two) times daily as needed. , Disp: , Rfl:  .  diclofenac (VOLTAREN) 75 MG EC tablet, TAKE 1 TABLET BY MOUTH TWICE A DAY, Disp: 180 tablet, Rfl: 0 .  estradiol (ESTRACE) 1 MG tablet, Take 1 tablet (1 mg total) by mouth daily., Disp: 90 tablet, Rfl: 1 .   fluticasone (FLONASE) 50 MCG/ACT nasal spray, Place 1 spray into both nostrils 2 (two) times daily as needed for allergies or rhinitis., Disp: 16 g, Rfl: 6 .  furosemide (LASIX) 40 MG tablet, TAKE 1 TABLET (40 MG TOTAL) BY MOUTH DAILY. (NEEDS TO BE SEEN BEFORE NEXT REFILL), Disp: 30 tablet, Rfl: 0 .  mirtazapine (REMERON) 15 MG tablet, Take 15 mg by mouth at bedtime., Disp: , Rfl:  .  morphine (MS CONTIN) 15 MG 12 hr tablet, Take 15 mg by mouth 3 (three) times daily., Disp: , Rfl: 0 .  naloxegol oxalate (MOVANTIK) 25 MG TABS tablet, Take 1 tablet (25 mg total) by mouth daily., Disp: 90 tablet, Rfl: 2 .  terbinafine (LAMISIL) 250 MG tablet, Take 1 tablet (250 mg total) by mouth daily., Disp: 90 tablet, Rfl: 0 .  tiZANidine (ZANAFLEX) 4 MG tablet, Take 1 Tablet by mouth at bedtime as needed FOR MUSCLE SPASMS, Disp: 60 tablet, Rfl: 5 .  topiramate (TOPAMAX) 50 MG tablet, Take 50 mg by mouth 2 (two) times daily., Disp: , Rfl:  .  vitamin B-12 (CYANOCOBALAMIN) 1000 MCG tablet, Take 1,000 mcg by mouth daily., Disp: , Rfl:  .  Vitamin D, Cholecalciferol, 1000 units TABS, Take 1,000 Units by mouth daily., Disp: 60 tablet, Rfl:   Current Facility-Administered Medications:  .  0.9 %  sodium chloride infusion, 500 mL, Intravenous, Once, Meryl Dare, MD  Allergies  Allergen Reactions  . Oxycodone-Acetaminophen Other (See  Comments)    Chest Pains Patient can tolerate acetaminophen  . Oxycontin [Oxycodone Hcl] Other (See Comments)    Causes chest pain  . Hydrocodone Other (See Comments)   Past Medical History:  Diagnosis Date  . Arthritis   . Chronic back pain   . Chronic constipation   . Depression   . MVP (mitral valve prolapse)    had ablation 1998  . Numbness in left leg    s/p spinal surgery  . Supraventricular tachycardia (HCC)    ablation 1998  . Vitamin D deficiency     Observations/Objective: A&O  No respiratory distress or wheezing audible over the phone. Very little  voice. Mood, judgement, and thought processes all WNL  Assessment and Plan: 1. Acute non-recurrent sinusitis, unspecified location Encouraged daily use of antihistamine and Flonase. - amoxicillin-clavulanate (AUGMENTIN) 875-125 MG tablet; Take 1 tablet by mouth 2 (two) times daily for 7 days.  Dispense: 14 tablet; Refill: 0 - predniSONE (STERAPRED UNI-PAK 21 TAB) 10 MG (21) TBPK tablet; As directed x 6 days  Dispense: 21 tablet; Refill: 0   Follow Up Instructions:  I discussed the assessment and treatment plan with the patient. The patient was provided an opportunity to ask questions and all were answered. The patient agreed with the plan and demonstrated an understanding of the instructions.   The patient was advised to call back or seek an in-person evaluation if the symptoms worsen or if the condition fails to improve as anticipated.  The above assessment and management plan was discussed with the patient. The patient verbalized understanding of and has agreed to the management plan. Patient is aware to call the clinic if symptoms persist or worsen. Patient is aware when to return to the clinic for a follow-up visit. Patient educated on when it is appropriate to go to the emergency department.   Time call ended: 1:54 PM  I provided 8 minutes of non-face-to-face time during this encounter.  Deliah Boston, MSN, APRN, FNP-C [facility] 05/19/20

## 2020-05-31 DIAGNOSIS — R69 Illness, unspecified: Secondary | ICD-10-CM | POA: Diagnosis not present

## 2020-05-31 DIAGNOSIS — F411 Generalized anxiety disorder: Secondary | ICD-10-CM | POA: Diagnosis not present

## 2020-05-31 DIAGNOSIS — F332 Major depressive disorder, recurrent severe without psychotic features: Secondary | ICD-10-CM | POA: Diagnosis not present

## 2020-06-01 ENCOUNTER — Telehealth: Payer: Self-pay

## 2020-06-01 MED ORDER — FLUCONAZOLE 150 MG PO TABS: 150.0000 mg | ORAL_TABLET | Freq: Once | ORAL | 0 refills | Status: AC

## 2020-06-01 NOTE — Telephone Encounter (Signed)
Diflucan 150 mg, 1 PO, #1, with no refill, sent to [facility] per Dr. Nadine Counts.  Patient aware.

## 2020-06-01 NOTE — Telephone Encounter (Signed)
Ok to send Diflucan 150mg  po ONCE for yeast infection. #1 no RF

## 2020-06-01 NOTE — Telephone Encounter (Signed)
Pt was given abx 2 weeks ago and now has a yeast infection. Please use [facility]

## 2020-06-02 DIAGNOSIS — R69 Illness, unspecified: Secondary | ICD-10-CM | POA: Diagnosis not present

## 2020-06-02 DIAGNOSIS — F332 Major depressive disorder, recurrent severe without psychotic features: Secondary | ICD-10-CM | POA: Diagnosis not present

## 2020-06-02 DIAGNOSIS — F411 Generalized anxiety disorder: Secondary | ICD-10-CM | POA: Diagnosis not present

## 2020-06-06 ENCOUNTER — Encounter: Payer: Self-pay | Admitting: Family Medicine

## 2020-06-21 DIAGNOSIS — F332 Major depressive disorder, recurrent severe without psychotic features: Secondary | ICD-10-CM | POA: Diagnosis not present

## 2020-06-21 DIAGNOSIS — R69 Illness, unspecified: Secondary | ICD-10-CM | POA: Diagnosis not present

## 2020-06-21 DIAGNOSIS — F411 Generalized anxiety disorder: Secondary | ICD-10-CM | POA: Diagnosis not present

## 2020-07-01 ENCOUNTER — Ambulatory Visit (INDEPENDENT_AMBULATORY_CARE_PROVIDER_SITE_OTHER): Payer: Medicare HMO

## 2020-07-01 ENCOUNTER — Ambulatory Visit (INDEPENDENT_AMBULATORY_CARE_PROVIDER_SITE_OTHER): Payer: Medicare HMO | Admitting: Family

## 2020-07-01 ENCOUNTER — Other Ambulatory Visit: Payer: Self-pay

## 2020-07-01 ENCOUNTER — Encounter: Payer: Self-pay | Admitting: Family

## 2020-07-01 VITALS — BP 107/64 | HR 75 | Temp 97.1°F | Resp 20 | Ht 72.0 in | Wt 208.0 lb

## 2020-07-01 DIAGNOSIS — W19XXXA Unspecified fall, initial encounter: Secondary | ICD-10-CM

## 2020-07-01 DIAGNOSIS — M25561 Pain in right knee: Secondary | ICD-10-CM

## 2020-07-01 DIAGNOSIS — M25562 Pain in left knee: Secondary | ICD-10-CM

## 2020-07-01 NOTE — Progress Notes (Signed)
Subjective:    Patient ID: Molly Castaneda, female    DOB: [birth year], 60 y.o.   MRN: [id number]  Chief Complaint  Patient presents with  . bilateral knee pain    Fall 2 weeks ago    Pt presents to the office today with right knee pain after a fall. She reports she fell down two steps in her home two weeks ago. She reports over the last week her pain has increased to a 9.5 out 10.  She takes MS contin 15 mg TID. States this helps.  Fall The accident occurred more than 1 week ago. She landed on concrete. There was no blood loss. The point of impact was the left knee and right knee. The pain is present in the right knee. The pain is at a severity of 9/10. The pain is moderate. The symptoms are aggravated by movement. Pertinent negatives include no bowel incontinence or loss of consciousness. She has tried rest, NSAID and acetaminophen for the symptoms. The treatment provided moderate relief.  Knee Pain  The injury mechanism was a direct blow. The pain is present in the right knee. The quality of the pain is described as aching. The pain is at a severity of 9/10. The pain is moderate. She reports no foreign bodies present. The symptoms are aggravated by weight bearing. She has tried acetaminophen for the symptoms. The treatment provided moderate relief.      Review of Systems  Gastrointestinal: Negative for bowel incontinence.  Neurological: Negative for loss of consciousness.  All other systems reviewed and are negative.      Objective:   Physical Exam Vitals reviewed.  Constitutional:      General: She is not in acute distress.    Appearance: She is well-developed.  HENT:     Head: Normocephalic and atraumatic.     Right Ear: Tympanic membrane normal.     Left Ear: Tympanic membrane normal.  Eyes:     Pupils: Pupils are equal, round, and reactive to light.  Neck:     Thyroid: No thyromegaly.  Cardiovascular:     Rate and Rhythm: Normal rate and regular rhythm.     Heart  sounds: Normal heart sounds. No murmur heard.   Pulmonary:     Effort: Pulmonary effort is normal. No respiratory distress.     Breath sounds: Normal breath sounds. No wheezing.  Abdominal:     General: Bowel sounds are normal. There is no distension.     Palpations: Abdomen is soft.     Tenderness: There is no abdominal tenderness.  Musculoskeletal:        General: Swelling (right knee trace of swelling) present. No tenderness.     Cervical back: Normal range of motion and neck supple.  Skin:    General: Skin is warm and dry.  Neurological:     Mental Status: She is alert and oriented to person, place, and time.     Cranial Nerves: No cranial nerve deficit.     Deep Tendon Reflexes: Reflexes are normal and symmetric.  Psychiatric:        Behavior: Behavior normal.        Thought Content: Thought content normal.        Judgment: Judgment normal.          BP 107/64   Pulse 75   Temp (!) 97.1 F (36.2 C)   Resp 20   Ht 6' (1.829 m)   Wt 208 lb (94.3 kg)  SpO2 95%   BMI 28.21 kg/m   Assessment & Plan:  Molly Castaneda comes in today with chief complaint of bilateral knee pain (Fall 2 weeks ago )   Diagnosis and orders addressed:  1. Acute pain of both knees - DG Knee 1-2 Views Left; Future - DG Knee 1-2 Views Right; Future  2. Fall, initial encounter - DG Knee 1-2 Views Left; Future - DG Knee 1-2 Views Right; Future  Increase diclofenac BID for next 7 days Elevate Ice If pain worsen or does not improve will need MRI or referral to [facility].  [name], FNP

## 2020-07-01 NOTE — Patient Instructions (Signed)
Acute Knee Pain, Adult Acute knee pain is sudden and may be caused by damage, swelling, or irritation of the muscles and tissues that support the knee. Pain may result from:  A fall.  An injury to the knee from twisting motions.  A hit to the knee.  Infection. Acute knee pain may go away on its own with time and rest. If it does not, your health care provider may order tests to find the cause of the pain. These may include:  Imaging tests, such as an X-ray, MRI, CT scan, or ultrasound.  Joint aspiration. In this test, fluid is removed from the knee and evaluated.  Arthroscopy. In this test, a lighted tube is inserted into the knee and an image is projected onto a TV screen.  Biopsy. In this test, a sample of tissue is removed from the body and studied under a microscope. Follow these instructions at home: If you have a knee sleeve or brace:  Wear the knee sleeve or brace as told by your health care provider. Remove it only as told by your health care provider.  Loosen it if your toes tingle, become numb, or turn cold and blue.  Keep it clean.  If the knee sleeve or brace is not waterproof: ? Do not let it get wet. ? Cover it with a watertight covering when you take a bath or shower.   Activity  Rest your knee.  Do not do things that cause pain or make pain worse.  Avoid high-impact activities or exercises, such as running, jumping rope, or doing jumping jacks.  Work with a physical therapist to make a safe exercise program, as recommended by your health care provider. Do exercises as told by your physical therapist. Managing pain, stiffness, and swelling  If directed, put ice on the affected knee. To do this: ? If you have a removable knee sleeve or brace, remove it as told by your health care provider. ? Put ice in a plastic bag. ? Place a towel between your skin and the bag. ? Leave the ice on for 20 minutes, 2-3 times a day. ? Remove the ice if your skin turns bright  red. This is very important. If you cannot feel pain, heat, or cold, you have a greater risk of damage to the area.  If directed, use an elastic bandage to put pressure (compression) on your injured knee. This may control swelling, give support, and help with discomfort.  Raise (elevate) your knee above the level of your heart while you are sitting or lying down.  Sleep with a pillow under your knee.   General instructions  Take over-the-counter and prescription medicines only as told by your health care provider.  Do not use any products that contain nicotine or tobacco, such as cigarettes, e-cigarettes, and chewing tobacco. If you need help quitting, ask your health care provider.  If you are overweight, work with your health care provider and a dietitian to set a weight-loss goal that is healthy and reasonable for you. Extra weight can put pressure on your knee.  Pay attention to any changes in your symptoms.  Keep all follow-up visits. This is important. Contact a health care provider if:  Your knee pain continues, changes, or gets worse.  You have a fever along with knee pain.  Your knee feels warm to the touch or is red.  Your knee buckles or locks up. Get help right away if:  Your knee swells, and the swelling becomes   worse.  You cannot move your knee.  You have severe pain in your knee that cannot be managed with pain medicine. Summary  Acute knee pain can be caused by a fall, an injury, an infection, or damage, swelling, or irritation of the tissues that support your knee.  Your health care provider may perform tests to find out the cause of the pain.  Pay attention to any changes in your symptoms. Relieve your pain with rest, medicines, light activity, and the use of ice.  Get help right away if your knee swells, you cannot move your knee, or you have severe pain that cannot be managed with medicine. This information is not intended to replace advice given to you  by your health care provider. Make sure you discuss any questions you have with your health care provider. Document Revised: 07/08/2019 Document Reviewed: 07/08/2019 Elsevier Patient Education  2021 [employer]  

## 2020-07-02 ENCOUNTER — Encounter (HOSPITAL_COMMUNITY): Payer: Self-pay

## 2020-07-02 ENCOUNTER — Other Ambulatory Visit: Payer: Self-pay

## 2020-07-02 ENCOUNTER — Emergency Department (HOSPITAL_COMMUNITY): Payer: Medicare HMO

## 2020-07-02 ENCOUNTER — Emergency Department (HOSPITAL_COMMUNITY)
Admission: EM | Admit: 2020-07-02 | Discharge: 2020-07-02 | Disposition: A | Discharge status: Home / Self Care | Payer: Medicare HMO | Attending: Emergency Medicine | Admitting: Emergency Medicine

## 2020-07-02 DIAGNOSIS — W108XXA Fall (on) (from) other stairs and steps, initial encounter: Secondary | ICD-10-CM | POA: Insufficient documentation

## 2020-07-02 DIAGNOSIS — M25561 Pain in right knee: Secondary | ICD-10-CM | POA: Diagnosis not present

## 2020-07-02 DIAGNOSIS — M5416 Radiculopathy, lumbar region: Secondary | ICD-10-CM | POA: Insufficient documentation

## 2020-07-02 DIAGNOSIS — M79604 Pain in right leg: Secondary | ICD-10-CM | POA: Diagnosis present

## 2020-07-02 DIAGNOSIS — M25551 Pain in right hip: Secondary | ICD-10-CM | POA: Diagnosis not present

## 2020-07-02 DIAGNOSIS — M545 Low back pain, unspecified: Secondary | ICD-10-CM | POA: Diagnosis not present

## 2020-07-02 DIAGNOSIS — M5410 Radiculopathy, site unspecified: Secondary | ICD-10-CM

## 2020-07-02 MED ORDER — DEXAMETHASONE SODIUM PHOSPHATE 10 MG/ML IJ SOLN
10.0000 mg | Freq: Once | INTRAMUSCULAR | Status: AC
  Administered 2020-07-02: 10 mg via INTRAMUSCULAR
  Filled 2020-07-02: qty 1

## 2020-07-02 MED ORDER — DICLOFENAC SODIUM 1 % EX GEL: 2.0000 g | Freq: Four times a day (QID) | CUTANEOUS | 0 refills | Status: DC | PRN

## 2020-07-02 NOTE — Discharge Instructions (Signed)

## 2020-07-02 NOTE — ED Triage Notes (Addendum)
Pt presents with complaints of falling 2 weeks ago. States she tripped and fell down two steps that goes down to her den where she landed on her knees. Reports she is having pain in her knees bilaterally. Reports in the last week she has been having pain in her right knee that is worse than the left.

## 2020-07-02 NOTE — ED Provider Notes (Signed)
Emergency Department Provider Note   I have reviewed the triage vital signs and the nursing notes.   HISTORY  Chief Complaint Fall and Knee Pain   HPI Molly Castaneda is a 60 y.o. female past medical history reviewed below presents to the emergency department for evaluation of pain especially in the right leg.  She states she had a fall 2 weeks ago and landed on both knees initially had little to no pain.  Around a week ago she developed pain in both legs but much worse in the right leg.  She denies any swelling but notes the pain is worse when she is lying flat and lifting/twisting the leg.  The pain radiates from her right hip down into her leg.  She attributed this to her knee.  She went to an outside emergency department had x-rays of both knees yesterday which she report did not show any fractures or other finding to explain her symptoms.  She is not having any discoloration to the foot, numbness, chest pain, shortness of breath. No additional falls or injuries.    Past Medical History:  Diagnosis Date  . Arthritis   . Chronic back pain   . Chronic constipation   . Depression   . MVP (mitral valve prolapse)    had ablation 1998  . Numbness in left leg    s/p spinal surgery  . Supraventricular tachycardia (HCC)    ablation 1998  . Vitamin D deficiency     Patient Active Problem List   Diagnosis Date Noted  . Chronic constipation   . Hyperlipemia 10/14/2018  . Adhesive capsulitis of left shoulder 05/14/2018  . Chronic left shoulder pain 09/18/2017  . Cervicalgia 09/18/2017  . Overweight (BMI 25.0-29.9) 05/20/2017  . Peripheral edema 09/07/2015  . Vitamin D deficiency 06/22/2014  . Depression 06/21/2014  . GAD (generalized anxiety disorder) 06/21/2014  . Back pain 06/21/2014  . Left ankle pain 10/16/2013  . Osteopenia 02/11/2013  . Left sided sciatica 01/11/2012  . Constipation 05/08/2010    Past Surgical History:  Procedure Laterality Date  . AV NODE ABLATION   1998   for svt  . BACK SURGERY  2001   lumbar fusion  . COLONOSCOPY  2010  . PARTIAL HYSTERECTOMY  1993  . REPAIR EXTENSOR TENDON Left 03/06/2013   Procedure: LEFT Kariya Lavergne BOUTONNIERE REPAIR;  Surgeon: Wyn Forster., MD;  Location: [facility];  Service: Orthopedics;  Laterality: Left;  . SPINE SURGERY    . stimulation system implant  07/31/00   lumbar nerve stimulator-none funtioning now    Allergies Oxycodone-acetaminophen, Oxycontin [oxycodone hcl], and Hydrocodone  Family History  Problem Relation Age of Onset  . Coronary artery disease Father   . Colon polyps Father   . Prostate cancer Maternal Grandfather   . Hodgkin's lymphoma Son   . Liver cancer Paternal Uncle        ?  Marland Kitchen Colon polyps Paternal Uncle   . Arthritis Mother   . Colon cancer Neg Hx   . Esophageal cancer Neg Hx   . Rectal cancer Neg Hx   . Stomach cancer Neg Hx     Social History Social History   Tobacco Use  . Smoking status: Never Smoker  . Smokeless tobacco: Never Used  Vaping Use  . Vaping Use: Never used  Substance Use Topics  . Alcohol use: No  . Drug use: No    Review of Systems  Constitutional: No fever/chills Musculoskeletal: Positive  right leg pain.  Skin: Negative for rash. Neurological: Negative for headaches, focal weakness or numbness.   ____________________________________________   PHYSICAL EXAM:  VITAL SIGNS: ED Triage Vitals  Enc Vitals Group     BP 07/02/20 1019 128/67     Pulse Rate 07/02/20 1019 81     Resp 07/02/20 1019 19     Temp 07/02/20 1019 98 F (36.7 C)     Temp src --      SpO2 07/02/20 1019 98 %   Constitutional: Alert and oriented. Well appearing and in no acute distress. Eyes: Conjunctivae are normal.  Head: Atraumatic. Nose: No congestion/rhinnorhea. Mouth/Throat: Mucous membranes are moist.  Neck: No stridor.   Cardiovascular: Normal rate, regular rhythm. Good peripheral circulation. Grossly normal heart sounds. 2+ DP pulse  on the right.  Respiratory: Normal respiratory effort.  No retractions. Lungs CTAB. Gastrointestinal: No distention.  Musculoskeletal: Normal range of motion of the right knee and hip.  Positive straight leg raise on the right. No sign of joint infection. No asymmetrical leg edema.  Neurologic:  Normal speech and language. No gross focal neurologic deficits are appreciated.  Skin:  Skin is warm, dry and intact. No rash noted.  ____________________________________________  RADIOLOGY  DG Lumbar Spine Complete  Result Date: 07/02/2020 CLINICAL DATA:  60 year old female with right leg and back pain after falling 2 weeks ago. EXAM: LUMBAR SPINE - COMPLETE 4+ VIEW COMPARISON:  12/26/2018 FINDINGS: There is no evidence of lumbar spine fracture. Interval worsening of previously visualized mild anterolisthesis of L4 on L5. Mild disc space height loss at this level. Unchanged ankylosis of L5 and S1. Again seen is a spinal cord stimulator with left posterior buttock generator location, catheter tip terminating at the level of T10-T11, similar to comparison. Surgical clips in the left hemipelvis. Moderate stool burden. IMPRESSION: 1. Slight interval worsening of previously visualized anterolisthesis of L4 on L5 which remains mild. No evidence of acute fracture. 2. 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 3. Similar appearance and location of spinal cord stimulator. Electronically Signed   By: Marliss Coots MD   On: 07/02/2020 10:55   DG Hip Unilat W or Wo Pelvis 2-3 Views Right  Result Date: 07/02/2020 CLINICAL DATA:  Fall 2 weeks ago.  Complaining of hip pain. EXAM: DG HIP (WITH OR WITHOUT PELVIS) 2-3V RIGHT COMPARISON:  None. FINDINGS: No fracture. Small bone island at the base of the right femoral head. No other bone lesion. Hip joints, SI joints and symphysis pubis are normally spaced and aligned. No arthropathic changes. Soft tissues are unremarkable. IMPRESSION: 1. No fracture.  No hip joint abnormality.  Electronically Signed   By: Amie Portland M.D.   On: 07/02/2020 10:54    ____________________________________________   PROCEDURES  Procedure(s) performed:   Procedures  None  ____________________________________________   INITIAL IMPRESSION / ASSESSMENT AND PLAN / ED COURSE  Pertinent labs & imaging results that were available during my care of the patient were reviewed by me and considered in my medical decision making (see chart for details).   Patient presents to the emergency department initially complaining of knee pain.  She has pain more through the entire leg and presentation seems more radicular with positive straight leg raise test.  Plain films of the lumbar spine and right hip obtained.  Results reviewed.  No fracture.  No exam findings to prompt MRI.  Plan for Decadron here and close PCP follow-up. Will give local ortho contact info as well.  ____________________________________________  FINAL CLINICAL IMPRESSION(S) / ED DIAGNOSES  Final diagnoses:  Radicular low back pain     MEDICATIONS GIVEN DURING THIS VISIT:  Medications  dexamethasone (DECADRON) injection 10 mg (has no administration in time range)     NEW OUTPATIENT MEDICATIONS STARTED DURING THIS VISIT:  New Prescriptions   DICLOFENAC SODIUM (VOLTAREN) 1 % GEL    Apply 2 g topically 4 (four) times daily as needed.    Note:  This document was prepared using Dragon voice recognition software and may include unintentional dictation errors.  Alona Bene, MD, [facility]    Valeria Boza, Arlyss Repress, MD 07/02/20 1106

## 2020-07-07 DIAGNOSIS — F332 Major depressive disorder, recurrent severe without psychotic features: Secondary | ICD-10-CM | POA: Diagnosis not present

## 2020-07-07 DIAGNOSIS — R69 Illness, unspecified: Secondary | ICD-10-CM | POA: Diagnosis not present

## 2020-07-07 DIAGNOSIS — F411 Generalized anxiety disorder: Secondary | ICD-10-CM | POA: Diagnosis not present

## 2020-07-18 ENCOUNTER — Telehealth: Payer: Self-pay

## 2020-07-18 NOTE — Telephone Encounter (Signed)
Spoke with patient and scheduled for tomorrow.

## 2020-07-18 NOTE — Telephone Encounter (Signed)
Patient would like to be worked into the schedule due to having pain in her right leg.  Patient had a recent fall.  Cb# [id number].  Please advise.  Thank you.

## 2020-07-19 ENCOUNTER — Ambulatory Visit: Payer: Medicare HMO | Admitting: Orthopaedic Surgery

## 2020-07-19 ENCOUNTER — Other Ambulatory Visit: Payer: Self-pay

## 2020-07-19 ENCOUNTER — Encounter: Payer: Self-pay | Admitting: Orthopaedic Surgery

## 2020-07-19 DIAGNOSIS — M79604 Pain in right leg: Secondary | ICD-10-CM | POA: Diagnosis not present

## 2020-07-19 DIAGNOSIS — R69 Illness, unspecified: Secondary | ICD-10-CM | POA: Diagnosis not present

## 2020-07-19 DIAGNOSIS — F411 Generalized anxiety disorder: Secondary | ICD-10-CM | POA: Diagnosis not present

## 2020-07-19 DIAGNOSIS — M25561 Pain in right knee: Secondary | ICD-10-CM | POA: Diagnosis not present

## 2020-07-19 DIAGNOSIS — F332 Major depressive disorder, recurrent severe without psychotic features: Secondary | ICD-10-CM | POA: Diagnosis not present

## 2020-07-19 MED ORDER — BUPIVACAINE HCL 0.25 % IJ SOLN
2.0000 mL | INTRAMUSCULAR | Status: AC | PRN
Start: 2020-07-19 — End: 2020-07-19
  Administered 2020-07-19: 2 mL via INTRA_ARTICULAR

## 2020-07-19 MED ORDER — BUPIVACAINE HCL 0.5 % IJ SOLN
2.0000 mL | INTRAMUSCULAR | Status: AC | PRN
  Administered 2020-07-19: 2 mL via INTRA_ARTICULAR

## 2020-07-19 MED ORDER — LIDOCAINE HCL 1 % IJ SOLN
2.0000 mL | INTRAMUSCULAR | Status: AC | PRN
  Administered 2020-07-19: 2 mL

## 2020-07-19 NOTE — Progress Notes (Signed)
Office Visit Note   Patient: Molly Castaneda           Date of Birth: 03-09-1960           MRN: [id number] Visit Date: 07/19/2020              Requested by: [name], FNP [address] [place],  [place] [address] PCP: [name], FNP   Assessment & Plan: Visit Diagnoses:  1. Pain in right leg   2. Acute pain of right knee     Plan: Molly Castaneda fell about 3 weeks ago landing on the anterior aspect of both of her knees.  She was seen initially by her primary care physician several days after the accident.  Films of both of her knees were negative for any arthritis or obvious fractures.  She went to the emergency room several days after that because she was having more trouble and x-rays of her lumbar spine did not demonstrate any significant fractures.  She has been followed through the neurosurgery department at [facility] and has a spinal stimulator but feels like her present problem is not related to her back.  The pain comes and goes and is located directly over the anterior and lateral aspect of her proximal right tibia.  She is not symptomatic on the left.  She is not having any calf pain.  She has not noticed any bruising and she does not have any pain to palpation.  Her exam was benign in terms of where she was experiencing her pain other than she was having a little bit of lateral joint pain in the right knee and I wonder if it is an referred discomfort.  The x-rays were negative for any obvious fracture.  I I suspect she is got some soft tissue bruising although there is nothing visible it may be worth injecting her knee with cortisone and just seeing if that makes a difference.  Follow-Up Instructions: Return if symptoms worsen or fail to improve.   Orders:  No orders of the defined types were placed in this encounter.  No orders of the defined types were placed in this encounter.     Procedures: Large Joint Inj: R knee on 07/19/2020 2:04 PM Indications: pain and  diagnostic evaluation Details: 25 G 1.5 in needle, anterolateral approach  Arthrogram: No  Medications: 2 mL lidocaine 1 %; 2 mL bupivacaine 0.5 %; 2 mL bupivacaine 0.25 %  12 mg betamethasone injected into right knee with Marcaine and Xylocaine through the lateral compartment Procedure, treatment alternatives, risks and benefits explained, specific risks discussed. Consent was given by the patient. Immediately prior to procedure a time out was called to verify the correct patient, procedure, equipment, support staff and site/side marked as required. Patient was prepped and draped in the usual sterile fashion.      Clinical Data: No additional findings.   Subjective: Chief Complaint  Patient presents with   Right Leg - Pain  Patient presents today for right leg pain. She states that she fell one month ago and landed on her knees. She states that she has been having a lot of pain in her right leg just below her knee. She said that the pain radiates up and down her leg. She cannot sleep. Her pain is a level 12 out of 10. She has difficulty moving her leg due to pain. Any prolonged standing makes it worse. She has noticed that her right foot swells throughout the day.  She takes Tizanidine, Morphine, and Biofreeze. She is in pain management at [facility]. She went for evaluation by her PCP and also at the [facility] ED. She has had x-rays already done and they are the Epic system.  HPI  Review of Systems   Objective: Vital Signs: Ht 6' (1.829 m)   Wt 208 lb (94.3 kg)   BMI 28.21 kg/m   Physical Exam Constitutional:      Appearance: She is well-developed.  Eyes:     Pupils: Pupils are equal, round, and reactive to light.  Pulmonary:     Effort: Pulmonary effort is normal.  Skin:    General: Skin is warm and dry.  Neurological:     Mental Status: She is alert and oriented to person, place, and time.  Psychiatric:        Behavior: Behavior normal.    Ortho Exam right lower  extremity without any obvious swelling.  There is some mild swelling of the left ankle which is asymptomatic.  This is chronic.  There was a little bit of lateral right knee joint pain but the knee was not warm.  No obvious effusion full extension with some pain referred to the proximal tibia anteriorly without motion.  Straight leg raise negative.  No groin pain with range of motion or lateral hip pain to palpation.  No calf pain.  Neurologically intact.  No pain to palpation along the proximal tibia where she was experiencing her pain.  No ecchymosis or bruising .no instability of the knee  Specialty Comments:  No specialty comments available.  Imaging: No results found.   PMFS History: Patient Active Problem List   Diagnosis Date Noted   Pain in right leg 07/19/2020   Pain in right knee 07/19/2020   Chronic constipation    Hyperlipemia 10/14/2018   Adhesive capsulitis of left shoulder 05/14/2018   Chronic left shoulder pain 09/18/2017   Cervicalgia 09/18/2017   Overweight (BMI 25.0-29.9) 05/20/2017   Peripheral edema 09/07/2015   Vitamin D deficiency 06/22/2014   Depression 06/21/2014   GAD (generalized anxiety disorder) 06/21/2014   Back pain 06/21/2014   Left ankle pain 10/16/2013   Osteopenia 02/11/2013   Left sided sciatica 01/11/2012   Constipation 05/08/2010   Past Medical History:  Diagnosis Date   Arthritis    Chronic back pain    Chronic constipation    Depression    MVP (mitral valve prolapse)    had ablation 1998   Numbness in left leg    s/p spinal surgery   Supraventricular tachycardia (HCC)    ablation 1998   Vitamin D deficiency     Family History  Problem Relation Age of Onset   Coronary artery disease Father    Colon polyps Father    Prostate cancer Maternal Grandfather    Hodgkin's lymphoma Son    Liver cancer Paternal Uncle        ?   Colon polyps Paternal Uncle    Arthritis Mother    Colon cancer Neg Hx    Esophageal cancer Neg Hx     Rectal cancer Neg Hx    Stomach cancer Neg Hx     Past Surgical History:  Procedure Laterality Date   AV NODE ABLATION  1998   for svt   BACK SURGERY  2001   lumbar fusion   COLONOSCOPY  2010   PARTIAL HYSTERECTOMY  1993   REPAIR EXTENSOR TENDON Left 03/06/2013   Procedure: LEFT LONG BOUTONNIERE REPAIR;  Surgeon: Wyn Forster., MD;  Location: [facility];  Service: Orthopedics;  Laterality: Left;   SPINE SURGERY     stimulation system implant  07/31/00   lumbar nerve stimulator-none funtioning now   Social History   Occupational History   Occupation: disabled    Associate Professor: UNEMPLOYED  Tobacco Use   Smoking status: Never   Smokeless tobacco: Never  Vaping Use   Vaping Use: Never used  Substance and Sexual Activity   Alcohol use: No   Drug use: No   Sexual activity: Yes

## 2020-07-20 ENCOUNTER — Other Ambulatory Visit: Payer: Self-pay | Admitting: Psychiatry

## 2020-07-22 ENCOUNTER — Telehealth: Payer: Self-pay | Admitting: Orthopaedic Surgery

## 2020-07-22 NOTE — Telephone Encounter (Signed)
Pt called stating she received an injection in her knee the previous visit this past week and was told by Dr. Cleophas Dunker to call back if she was still experiencing pain which is the reason for her call. The best call back number is [phone].

## 2020-07-25 ENCOUNTER — Telehealth: Payer: Self-pay

## 2020-07-25 ENCOUNTER — Other Ambulatory Visit: Payer: Self-pay

## 2020-07-25 DIAGNOSIS — M25561 Pain in right knee: Secondary | ICD-10-CM

## 2020-07-25 DIAGNOSIS — M79604 Pain in right leg: Secondary | ICD-10-CM

## 2020-07-25 NOTE — Telephone Encounter (Signed)
Spoke with patient. Confirmed that we are doing the MRI and will see her back after the MRI. She states that she has more pain in her knee when she uses the Biofreeze, therefore I advised her to stop using it.

## 2020-07-25 NOTE — Telephone Encounter (Signed)
MRI right knee to include proximal tibia-thanks

## 2020-07-25 NOTE — Telephone Encounter (Signed)
Called patient and left message on voicemail. MRI has been ordered.

## 2020-07-25 NOTE — Telephone Encounter (Signed)
Pt called and would like a call back from lauren

## 2020-07-27 DIAGNOSIS — M961 Postlaminectomy syndrome, not elsewhere classified: Secondary | ICD-10-CM | POA: Diagnosis not present

## 2020-08-03 ENCOUNTER — Ambulatory Visit: Payer: Medicare HMO | Admitting: Orthopaedic Surgery

## 2020-08-10 DIAGNOSIS — F332 Major depressive disorder, recurrent severe without psychotic features: Secondary | ICD-10-CM | POA: Diagnosis not present

## 2020-08-10 DIAGNOSIS — R69 Illness, unspecified: Secondary | ICD-10-CM | POA: Diagnosis not present

## 2020-08-16 ENCOUNTER — Other Ambulatory Visit: Payer: Self-pay | Admitting: Family

## 2020-08-16 DIAGNOSIS — B351 Tinea unguium: Secondary | ICD-10-CM

## 2020-08-16 DIAGNOSIS — R69 Illness, unspecified: Secondary | ICD-10-CM | POA: Diagnosis not present

## 2020-08-16 DIAGNOSIS — F332 Major depressive disorder, recurrent severe without psychotic features: Secondary | ICD-10-CM | POA: Diagnosis not present

## 2020-09-06 DIAGNOSIS — F332 Major depressive disorder, recurrent severe without psychotic features: Secondary | ICD-10-CM | POA: Diagnosis not present

## 2020-09-06 DIAGNOSIS — R69 Illness, unspecified: Secondary | ICD-10-CM | POA: Diagnosis not present

## 2020-09-06 DIAGNOSIS — F411 Generalized anxiety disorder: Secondary | ICD-10-CM | POA: Diagnosis not present

## 2020-09-20 ENCOUNTER — Telehealth: Payer: Self-pay | Admitting: Family

## 2020-09-20 NOTE — Telephone Encounter (Signed)
  Prescription Request  09/20/2020  What is the name of the medication or equipment? terbinafine (LAMISIL) 250 MG tablet   Have you contacted your pharmacy to request a refill? (if applicable) YES  Which pharmacy would you like this sent to? [facility]    Patient notified that their request is being sent to the clinical staff for review and that they should receive a response within 2 business days.

## 2020-09-20 NOTE — Telephone Encounter (Signed)
Left message to call back  

## 2020-09-20 NOTE — Telephone Encounter (Signed)
Patient aware.

## 2020-09-20 NOTE — Telephone Encounter (Signed)
This is a medication you only take for 3 months to toenail fungus. It stays in your system. It may be a year until all fungus is gone.

## 2020-09-20 NOTE — Telephone Encounter (Signed)
Last BMP 05/12/20 Last refill 05/12/20, #90, no refills

## 2020-09-22 ENCOUNTER — Other Ambulatory Visit: Payer: Self-pay | Admitting: Family

## 2020-09-22 DIAGNOSIS — B351 Tinea unguium: Secondary | ICD-10-CM

## 2020-10-04 DIAGNOSIS — R69 Illness, unspecified: Secondary | ICD-10-CM | POA: Diagnosis not present

## 2020-10-04 DIAGNOSIS — F332 Major depressive disorder, recurrent severe without psychotic features: Secondary | ICD-10-CM | POA: Diagnosis not present

## 2020-10-05 ENCOUNTER — Other Ambulatory Visit: Payer: Self-pay

## 2020-10-05 DIAGNOSIS — R69 Illness, unspecified: Secondary | ICD-10-CM | POA: Diagnosis not present

## 2020-10-05 DIAGNOSIS — F411 Generalized anxiety disorder: Secondary | ICD-10-CM | POA: Diagnosis not present

## 2020-10-05 DIAGNOSIS — M79604 Pain in right leg: Secondary | ICD-10-CM

## 2020-10-05 DIAGNOSIS — F332 Major depressive disorder, recurrent severe without psychotic features: Secondary | ICD-10-CM | POA: Diagnosis not present

## 2020-10-05 DIAGNOSIS — M25561 Pain in right knee: Secondary | ICD-10-CM

## 2020-10-13 DIAGNOSIS — Z79891 Long term (current) use of opiate analgesic: Secondary | ICD-10-CM | POA: Diagnosis not present

## 2020-10-13 DIAGNOSIS — G8929 Other chronic pain: Secondary | ICD-10-CM | POA: Diagnosis not present

## 2020-10-13 DIAGNOSIS — R69 Illness, unspecified: Secondary | ICD-10-CM | POA: Diagnosis not present

## 2020-10-13 DIAGNOSIS — G894 Chronic pain syndrome: Secondary | ICD-10-CM | POA: Diagnosis not present

## 2020-10-13 DIAGNOSIS — Z5181 Encounter for therapeutic drug level monitoring: Secondary | ICD-10-CM | POA: Diagnosis not present

## 2020-10-13 DIAGNOSIS — M545 Low back pain, unspecified: Secondary | ICD-10-CM | POA: Diagnosis not present

## 2020-10-13 DIAGNOSIS — F1190 Opioid use, unspecified, uncomplicated: Secondary | ICD-10-CM | POA: Diagnosis not present

## 2020-10-13 DIAGNOSIS — M961 Postlaminectomy syndrome, not elsewhere classified: Secondary | ICD-10-CM | POA: Diagnosis not present

## 2020-10-24 ENCOUNTER — Ambulatory Visit (INDEPENDENT_AMBULATORY_CARE_PROVIDER_SITE_OTHER): Payer: Medicare HMO

## 2020-10-24 VITALS — Ht 72.0 in | Wt 199.0 lb

## 2020-10-24 DIAGNOSIS — Z599 Problem related to housing and economic circumstances, unspecified: Secondary | ICD-10-CM

## 2020-10-24 DIAGNOSIS — Z5941 Food insecurity: Secondary | ICD-10-CM | POA: Diagnosis not present

## 2020-10-24 DIAGNOSIS — Z1231 Encounter for screening mammogram for malignant neoplasm of breast: Secondary | ICD-10-CM

## 2020-10-24 DIAGNOSIS — Z0000 Encounter for general adult medical examination without abnormal findings: Secondary | ICD-10-CM | POA: Diagnosis not present
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
## 2020-10-24 NOTE — Patient Instructions (Signed)
Molly Castaneda , Thank you for taking time to come for your Medicare Wellness Visit. I appreciate your ongoing commitment to your health goals. Please review the following plan we discussed and let me know if I can assist you in the future.   Screening recommendations/referrals: Colonoscopy: Done 05/26/2019 - Repeat in 10 years  Mammogram: Done 09/08/2019 - Due for repeat - ordered today Bone Density: Done 04/13/2019 - Repeat every 2 years  Recommended yearly ophthalmology/optometry visit for glaucoma screening and checkup Recommended yearly dental visit for hygiene and checkup  Vaccinations: Influenza vaccine: Due. Every fall Pneumococcal vaccine: Due. 2 doses one year apart Tdap vaccine: Done 10/19/2011 - Repeat in 10 years Shingles vaccine: Due. Shingrix discussed. Please contact your pharmacy or insurance for coverage information.    Covid-19: Done 04/22/19, 05/18/19, 12/26/19, 07/17/2020  Advanced directives: Please bring a copy of your health care power of attorney and living will to the office to be added to your chart at your convenience.   Conditions/risks identified: Aim for 30 minutes of exercise or brisk walking each day, drink 6-8 glasses of water and eat lots of fruits and vegetables.   Next appointment: Follow up in one year for your annual wellness visit.  Preventive Care 40-64 Years, Female Preventive care refers to lifestyle choices and visits with your health care provider that can promote health and wellness. What does preventive care include? A yearly physical exam. This is also called an annual well check. Dental exams once or twice a year. Routine eye exams. Ask your health care provider how often you should have your eyes checked. Personal lifestyle choices, including: Daily care of your teeth and gums. Regular physical activity. Eating a healthy diet. Avoiding tobacco and drug use. Limiting alcohol use. Practicing safe sex. Taking low-dose aspirin daily starting at age  65. Taking vitamin and mineral supplements as recommended by your health care provider. What happens during an annual well check? The services and screenings done by your health care provider during your annual well check will depend on your age, overall health, lifestyle risk factors, and family history of disease. Counseling  Your health care provider may ask you questions about your: Alcohol use. Tobacco use. Drug use. Emotional well-being. Home and relationship well-being. Sexual activity. Eating habits. Work and work Statistician. Method of birth control. Menstrual cycle. Pregnancy history. Screening  You may have the following tests or measurements: Height, weight, and BMI. Blood pressure. Lipid and cholesterol levels. These may be checked every 5 years, or more frequently if you are over 54 years old. Skin check. Lung cancer screening. You may have this screening every year starting at age 73 if you have a 30-pack-year history of smoking and currently smoke or have quit within the past 15 years. Fecal occult blood test (FOBT) of the stool. You may have this test every year starting at age 55. Flexible sigmoidoscopy or colonoscopy. You may have a sigmoidoscopy every 5 years or a colonoscopy every 10 years starting at age 27. Hepatitis C blood test. Hepatitis B blood test. Sexually transmitted disease (STD) testing. Diabetes screening. This is done by checking your blood sugar (glucose) after you have not eaten for a while (fasting). You may have this done every 1-3 years. Mammogram. This may be done every 1-2 years. Talk to your health care provider about when you should start having regular mammograms. This may depend on whether you have a family history of breast cancer. BRCA-related cancer screening. This may be done if you  have a family history of breast, ovarian, tubal, or peritoneal cancers. Pelvic exam and Pap test. This may be done every 3 years starting at age 73.  Starting at age 61, this may be done every 5 years if you have a Pap test in combination with an HPV test. Bone density scan. This is done to screen for osteoporosis. You may have this scan if you are at high risk for osteoporosis. Discuss your test results, treatment options, and if necessary, the need for more tests with your health care provider. Vaccines  Your health care provider may recommend certain vaccines, such as: Influenza vaccine. This is recommended every year. Tetanus, diphtheria, and acellular pertussis (Tdap, Td) vaccine. You may need a Td booster every 10 years. Zoster vaccine. You may need this after age 63. Pneumococcal 13-valent conjugate (PCV13) vaccine. You may need this if you have certain conditions and were not previously vaccinated. Pneumococcal polysaccharide (PPSV23) vaccine. You may need one or two doses if you smoke cigarettes or if you have certain conditions. Talk to your health care provider about which screenings and vaccines you need and how often you need them. This information is not intended to replace advice given to you by your health care provider. Make sure you discuss any questions you have with your health care provider. Document Released: 02/18/2015 Document Revised: 10/12/2015 Document Reviewed: 11/23/2014 Elsevier Interactive Patient Education  2017 [employer] Prevention in the Home Falls can cause injuries. They can happen to people of all ages. There are many things you can do to make your home safe and to help prevent falls. What can I do on the outside of my home? Regularly fix the edges of walkways and driveways and fix any cracks. Remove anything that might make you trip as you walk through a door, such as a raised step or threshold. Trim any bushes or trees on the path to your home. Use bright outdoor lighting. Clear any walking paths of anything that might make someone trip, such as rocks or tools. Regularly check to see if  handrails are loose or broken. Make sure that both sides of any steps have handrails. Any raised decks and porches should have guardrails on the edges. Have any leaves, snow, or ice cleared regularly. Use sand or salt on walking paths during winter. Clean up any spills in your garage right away. This includes oil or grease spills. What can I do in the bathroom? Use night lights. Install grab bars by the toilet and in the tub and shower. Do not use towel bars as grab bars. Use non-skid mats or decals in the tub or shower. If you need to sit down in the shower, use a plastic, non-slip stool. Keep the floor dry. Clean up any water that spills on the floor as soon as it happens. Remove soap buildup in the tub or shower regularly. Attach bath mats securely with double-sided non-slip rug tape. Do not have throw rugs and other things on the floor that can make you trip. What can I do in the bedroom? Use night lights. Make sure that you have a light by your bed that is easy to reach. Do not use any sheets or blankets that are too big for your bed. They should not hang down onto the floor. Have a firm chair that has side arms. You can use this for support while you get dressed. Do not have throw rugs and other things on the floor that  can make you trip. What can I do in the kitchen? Clean up any spills right away. Avoid walking on wet floors. Keep items that you use a lot in easy-to-reach places. If you need to reach something above you, use a strong step stool that has a grab bar. Keep electrical cords out of the way. Do not use floor polish or wax that makes floors slippery. If you must use wax, use non-skid floor wax. Do not have throw rugs and other things on the floor that can make you trip. What can I do with my stairs? Do not leave any items on the stairs. Make sure that there are handrails on both sides of the stairs and use them. Fix handrails that are broken or loose. Make sure that  handrails are as long as the stairways. Check any carpeting to make sure that it is firmly attached to the stairs. Fix any carpet that is loose or worn. Avoid having throw rugs at the top or bottom of the stairs. If you do have throw rugs, attach them to the floor with carpet tape. Make sure that you have a light switch at the top of the stairs and the bottom of the stairs. If you do not have them, ask someone to add them for you. What else can I do to help prevent falls? Wear shoes that: Do not have high heels. Have rubber bottoms. Are comfortable and fit you well. Are closed at the toe. Do not wear sandals. If you use a stepladder: Make sure that it is fully opened. Do not climb a closed stepladder. Make sure that both sides of the stepladder are locked into place. Ask someone to hold it for you, if possible. Clearly mark and make sure that you can see: Any grab bars or handrails. First and last steps. Where the edge of each step is. Use tools that help you move around (mobility aids) if they are needed. These include: Canes. Walkers. Scooters. Crutches. Turn on the lights when you go into a dark area. Replace any light bulbs as soon as they burn out. Set up your furniture so you have a clear path. Avoid moving your furniture around. If any of your floors are uneven, fix them. If there are any pets around you, be aware of where they are. Review your medicines with your doctor. Some medicines can make you feel dizzy. This can increase your chance of falling. Ask your doctor what other things that you can do to help prevent falls. This information is not intended to replace advice given to you by your health care provider. Make sure you discuss any questions you have with your health care provider. Document Released: 11/18/2008 Document Revised: 06/30/2015 Document Reviewed: 02/26/2014 Elsevier Interactive Patient Education  2017 Elsevier Inc.   Managing Depression,  Adult Depression is a mental health condition that affects your thoughts, feelings, and actions. Being diagnosed with depression can bring you relief if you did not know why you have felt or behaved a certain way. It could also leave you feeling overwhelmed with uncertainty about your future. Preparing yourself to manage your symptoms can help you feel more positive about your future. How to manage lifestyle changes Managing stress Stress is your body's reaction to life changes and events, both good and bad. Stress can add to your feelings of depression. Learning to manage your stress can help lessen your feelings of depression. Try some of the following approaches to reducing your stress (stress reduction techniques):  Listen to music that you enjoy and that inspires you. Try using a meditation app or take a meditation class. Develop a practice that helps you connect with your spiritual self. Walk in nature, pray, or go to a place of worship. Do some deep breathing. To do this, inhale slowly through your nose. Pause at the top of your inhale for a few seconds and then exhale slowly, letting your muscles relax. Practice yoga to help relax and work your muscles. Choose a stress reduction technique that suits your lifestyle and personality. These techniques take time and practice to develop. Set aside 5-15 minutes a day to do them. Therapists can offer training in these techniques. Other things you can do to manage stress include: Keeping a stress diary. Knowing your limits and saying no when you think something is too much. Paying attention to how you react to certain situations. You may not be able to control everything, but you can change your reaction. Adding humor to your life by watching funny films or TV shows. Making time for activities that you enjoy and that relax you.  Medicines Medicines, such as antidepressants, are often a part of treatment for depression. Talk with your pharmacist or  health care provider about all the medicines, supplements, and herbal products that you take, their possible side effects, and what medicines and other products are safe to take together. Make sure to report any side effects you may have to your health care provider. Relationships Your health care provider may suggest family therapy, couples therapy, or individual therapy as part of your treatment. How to recognize changes Everyone responds differently to treatment for depression. As you recover from depression, you may start to: Have more interest in doing activities. Feel less hopeless. Have more energy. Overeat less often, or have a better appetite. Have better mental focus. It is important to recognize if your depression is not getting better or is getting worse. The symptoms you had in the beginning may return, such as: Tiredness (fatigue) or low energy. Eating too much or too little. Sleeping too much or too little. Feeling restless, agitated, or hopeless. Trouble focusing or making decisions. Unexplained physical complaints. Feeling irritable, angry, or aggressive. If you or your family members notice these symptoms coming back, let your health care provider know right away. Follow these instructions at home: Activity  Try to get some form of exercise each day, such as walking, biking, swimming, or lifting weights. Practice stress reduction techniques. Engage your mind by taking a class or doing some volunteer work. Lifestyle Get the right amount and quality of sleep. Cut down on using caffeine, tobacco, alcohol, and other potentially harmful substances. Eat a healthy diet that includes plenty of vegetables, fruits, whole grains, low-fat dairy products, and lean protein. Do not eat a lot of foods that are high in solid fats, added sugars, or salt (sodium). General instructions Take over-the-counter and prescription medicines only as told by your health care provider. Keep all  follow-up visits as told by your health care provider. This is important. Where to find support Talking to others Friends and family members can be sources of support and guidance. Talk to trusted friends or family members about your condition. Explain your symptoms to them, and let them know that you are working with a health care provider to treat your depression. Tell friends and family members how they also can be helpful. Finances Find appropriate mental health providers that fit with your financial situation. Talk with your  health care provider about options to get reduced prices on your medicines. Where to find more information You can find support in your area from: Anxiety and Depression Association of America (ADAA): www.adaa.org Mental Health America: www.mentalhealthamerica.net [employer] on Mental Illness: www.nami.org Contact a health care provider if: You stop taking your antidepressant medicines, and you have any of these symptoms: Nausea. Headache. Light-headedness. Chills and body aches. Not being able to sleep (insomnia). You or your friends and family think your depression is getting worse. Get help right away if: You have thoughts of hurting yourself or others. If you ever feel like you may hurt yourself or others, or have thoughts about taking your own life, get help right away. Go to your nearest emergency department or: Call your local emergency services (911 in the [place] Call a suicide crisis helpline, such as the [place] at (956) 625-0503. This is open 24 hours a day in the [place] Text the Crisis Text Line at (606)630-8447 (in the [place].). Summary If you are diagnosed with depression, preparing yourself to manage your symptoms is a good way to feel positive about your future. Work with your health care provider on a management plan that includes stress reduction techniques, medicines (if applicable), therapy, and healthy lifestyle  habits. Keep talking with your health care provider about how your treatment is working. If you have thoughts about taking your own life, call a suicide crisis helpline or text a crisis text line. This information is not intended to replace advice given to you by your health care provider. Make sure you discuss any questions you have with your health care provider. Document Revised: 12/03/2018 Document Reviewed: 12/03/2018 Elsevier Patient Education  2022 [employer].

## 2020-10-25 DIAGNOSIS — F1120 Opioid dependence, uncomplicated: Secondary | ICD-10-CM | POA: Diagnosis not present

## 2020-10-25 DIAGNOSIS — R69 Illness, unspecified: Secondary | ICD-10-CM | POA: Diagnosis not present

## 2020-10-25 DIAGNOSIS — F411 Generalized anxiety disorder: Secondary | ICD-10-CM | POA: Diagnosis not present

## 2020-10-25 DIAGNOSIS — F332 Major depressive disorder, recurrent severe without psychotic features: Secondary | ICD-10-CM | POA: Diagnosis not present

## 2020-10-25 DIAGNOSIS — F4323 Adjustment disorder with mixed anxiety and depressed mood: Secondary | ICD-10-CM | POA: Diagnosis not present

## 2020-10-26 ENCOUNTER — Ambulatory Visit
Admission: RE | Admit: 2020-10-26 | Discharge: 2020-10-26 | Disposition: A | Discharge status: Home / Self Care | Payer: Medicare HMO | Source: Ambulatory Visit | Attending: Family | Admitting: Family

## 2020-10-26 ENCOUNTER — Other Ambulatory Visit: Payer: Self-pay

## 2020-10-26 DIAGNOSIS — Z1231 Encounter for screening mammogram for malignant neoplasm of breast: Secondary | ICD-10-CM

## 2020-11-03 ENCOUNTER — Telehealth: Payer: Self-pay

## 2020-11-03 NOTE — Telephone Encounter (Signed)
   Telephone encounter was:  Unsuccessful.  11/03/2020 Name: Molly Castaneda MRN: [id number] DOB: Jan 10, 1961  Unsuccessful outbound call made today to assist with:   Medicare Extrahelp and food pantries.  Outreach Attempt:  1st Attempt  A HIPAA compliant voice message was left requesting a return call.  Instructed patient to call back at [phone].  [name], AAS Paralegal, [facility] Care Guide  Embedded Care Coordination [place]  Care Management  [address] [place], [place] [address] ??[email or web address]  ?? [phone]   [email or web address]

## 2020-11-04 ENCOUNTER — Other Ambulatory Visit: Payer: Self-pay

## 2020-11-04 ENCOUNTER — Ambulatory Visit
Admission: RE | Admit: 2020-11-04 | Discharge: 2020-11-04 | Disposition: A | Discharge status: Home / Self Care | Payer: Medicare HMO | Source: Ambulatory Visit | Attending: Orthopaedic Surgery | Admitting: Orthopaedic Surgery

## 2020-11-04 DIAGNOSIS — M79604 Pain in right leg: Secondary | ICD-10-CM

## 2020-11-04 DIAGNOSIS — M25561 Pain in right knee: Secondary | ICD-10-CM

## 2020-11-04 DIAGNOSIS — M1711 Unilateral primary osteoarthritis, right knee: Secondary | ICD-10-CM | POA: Diagnosis not present

## 2020-11-06 ENCOUNTER — Other Ambulatory Visit: Payer: Self-pay | Admitting: Family Medicine

## 2020-11-06 DIAGNOSIS — J0100 Acute maxillary sinusitis, unspecified: Secondary | ICD-10-CM

## 2020-11-07 DIAGNOSIS — R69 Illness, unspecified: Secondary | ICD-10-CM | POA: Diagnosis not present

## 2020-11-07 DIAGNOSIS — F411 Generalized anxiety disorder: Secondary | ICD-10-CM | POA: Diagnosis not present

## 2020-11-07 DIAGNOSIS — F4323 Adjustment disorder with mixed anxiety and depressed mood: Secondary | ICD-10-CM | POA: Diagnosis not present

## 2020-11-07 DIAGNOSIS — F1120 Opioid dependence, uncomplicated: Secondary | ICD-10-CM | POA: Diagnosis not present

## 2020-11-07 DIAGNOSIS — F332 Major depressive disorder, recurrent severe without psychotic features: Secondary | ICD-10-CM | POA: Diagnosis not present

## 2020-11-11 ENCOUNTER — Ambulatory Visit (INDEPENDENT_AMBULATORY_CARE_PROVIDER_SITE_OTHER): Payer: Medicare HMO | Admitting: Family

## 2020-11-11 ENCOUNTER — Other Ambulatory Visit: Payer: Self-pay

## 2020-11-11 ENCOUNTER — Encounter: Payer: Self-pay | Admitting: Family

## 2020-11-11 VITALS — BP 122/72 | HR 77 | Temp 97.9°F | Ht 72.0 in | Wt 200.4 lb

## 2020-11-11 DIAGNOSIS — Z23 Encounter for immunization: Secondary | ICD-10-CM | POA: Diagnosis not present

## 2020-11-11 DIAGNOSIS — R609 Edema, unspecified: Secondary | ICD-10-CM

## 2020-11-11 DIAGNOSIS — G8929 Other chronic pain: Secondary | ICD-10-CM

## 2020-11-11 DIAGNOSIS — F411 Generalized anxiety disorder: Secondary | ICD-10-CM | POA: Diagnosis not present

## 2020-11-11 DIAGNOSIS — M5432 Sciatica, left side: Secondary | ICD-10-CM | POA: Diagnosis not present

## 2020-11-11 DIAGNOSIS — K5909 Other constipation: Secondary | ICD-10-CM | POA: Diagnosis not present

## 2020-11-11 DIAGNOSIS — R69 Illness, unspecified: Secondary | ICD-10-CM | POA: Diagnosis not present

## 2020-11-11 DIAGNOSIS — E785 Hyperlipidemia, unspecified: Secondary | ICD-10-CM

## 2020-11-11 DIAGNOSIS — K5903 Drug induced constipation: Secondary | ICD-10-CM | POA: Diagnosis not present

## 2020-11-11 DIAGNOSIS — F32A Depression, unspecified: Secondary | ICD-10-CM

## 2020-11-11 DIAGNOSIS — M5442 Lumbago with sciatica, left side: Secondary | ICD-10-CM

## 2020-11-11 DIAGNOSIS — E663 Overweight: Secondary | ICD-10-CM | POA: Diagnosis not present

## 2020-11-11 DIAGNOSIS — R600 Localized edema: Secondary | ICD-10-CM

## 2020-11-11 LAB — CMP14+EGFR
ALT: 14 IU/L (ref 0–32)
AST: 13 IU/L (ref 0–40)
Albumin/Globulin Ratio: 1.8 (ref 1.2–2.2)
Albumin: 4.3 g/dL (ref 3.8–4.8)
Alkaline Phosphatase: 76 IU/L (ref 44–121)
BUN/Creatinine Ratio: 17 (ref 12–28)
BUN: 16 mg/dL (ref 8–27)
Bilirubin Total: 0.6 mg/dL (ref 0.0–1.2)
CO2: 21 mmol/L (ref 20–29)
Calcium: 9 mg/dL (ref 8.7–10.3)
Chloride: 105 mmol/L (ref 96–106)
Creatinine, Ser: 0.96 mg/dL (ref 0.57–1.00)
Globulin, Total: 2.4 g/dL (ref 1.5–4.5)
Glucose: 80 mg/dL (ref 70–99)
Potassium: 4.5 mmol/L (ref 3.5–5.2)
Sodium: 139 mmol/L (ref 134–144)
Total Protein: 6.7 g/dL (ref 6.0–8.5)
eGFR: 67 mL/min/{1.73_m2} (ref >59)

## 2020-11-11 LAB — CBC WITH DIFFERENTIAL/PLATELET
Basophils Absolute: 0 10*3/uL (ref 0.0–0.2)
Basos: 1 %
EOS (ABSOLUTE): 0.1 10*3/uL (ref 0.0–0.4)
Eos: 3 %
Hematocrit: 42.1 % (ref 34.0–46.6)
Hemoglobin: 13.2 g/dL (ref 11.1–15.9)
Immature Grans (Abs): 0 10*3/uL (ref 0.0–0.1)
Immature Granulocytes: 0 %
Lymphocytes Absolute: 1.7 10*3/uL (ref 0.7–3.1)
Lymphs: 33 %
MCH: 26.4 pg — ABNORMAL LOW (ref 26.6–33.0)
MCHC: 31.4 g/dL — ABNORMAL LOW (ref 31.5–35.7)
MCV: 84 fL (ref 79–97)
Monocytes Absolute: 0.5 10*3/uL (ref 0.1–0.9)
Monocytes: 11 %
Neutrophils Absolute: 2.7 10*3/uL (ref 1.4–7.0)
Neutrophils: 52 %
Platelets: 322 10*3/uL (ref 150–450)
RBC: 5 x10E6/uL (ref 3.77–5.28)
RDW: 13.1 % (ref 11.7–15.4)
WBC: 5 10*3/uL (ref 3.4–10.8)

## 2020-11-11 MED ORDER — FUROSEMIDE 40 MG PO TABS: 40.0000 mg | ORAL_TABLET | Freq: Every day | ORAL | 0 refills | Status: DC

## 2020-11-11 MED ORDER — BUPROPION HCL ER (SR) 150 MG PO TB12: 150.0000 mg | ORAL_TABLET | Freq: Two times a day (BID) | ORAL | 0 refills | Status: AC

## 2020-11-11 MED ORDER — NALOXEGOL OXALATE 25 MG PO TABS: 25.0000 mg | ORAL_TABLET | Freq: Every day | ORAL | 2 refills | Status: AC

## 2020-11-11 NOTE — Patient Instructions (Signed)

## 2020-11-11 NOTE — Progress Notes (Signed)
Subjective:    Patient ID: Molly Castaneda, female    DOB: 1960-10-24, 60 y.o.   MRN: [id number]  Chief Complaint  Patient presents with   Medical Management of Chronic Issues   PT presents to the office today for chronic follow up. Pt is followed by Pain management every 3 months. She is currently taking ms contin. She has chronic left shoulder pain and left sided sciatica pain.    She is followed by Behavioral health every 3 weeks Klonopin and Adderall. Back Pain This is a chronic problem. The current episode started more than 1 year ago. The problem occurs intermittently. The problem has been waxing and waning since onset. The pain is present in the lumbar spine. The quality of the pain is described as aching. The pain is at a severity of 5/10. The pain is moderate.  Constipation This is a chronic problem. The current episode started more than 1 year ago. The problem has been waxing and waning since onset. Her stool frequency is 1 time per week or less. Associated symptoms include back pain. Risk factors include obesity. She has tried laxatives for the symptoms. The treatment provided mild relief.  Hyperlipidemia This is a chronic problem. The current episode started more than 1 year ago. Exacerbating diseases include obesity. Current antihyperlipidemic treatment includes diet change. The current treatment provides mild improvement of lipids. Risk factors for coronary artery disease include dyslipidemia, hypertension, a sedentary lifestyle and post-menopausal.  Anxiety Presents for follow-up visit. Symptoms include depressed mood, excessive worry, irritability, nervous/anxious behavior and restlessness. Symptoms occur most days. The severity of symptoms is moderate.    Depression        This is a chronic problem.  The current episode started more than 1 year ago.   The onset quality is gradual.   Associated symptoms include helplessness, hopelessness, irritable, restlessness and sad.   Compliance with treatment is good.  Past medical history includes anxiety.      Review of Systems  Constitutional:  Positive for irritability.  Gastrointestinal:  Positive for constipation.  Musculoskeletal:  Positive for back pain.  Psychiatric/Behavioral:  Positive for depression. The patient is nervous/anxious.   All other systems reviewed and are negative.     Objective:   Physical Exam Vitals reviewed.  Constitutional:      General: She is irritable. She is not in acute distress.    Appearance: She is well-developed. She is obese.  HENT:     Head: Normocephalic and atraumatic.     Right Ear: Tympanic membrane normal.     Left Ear: Tympanic membrane normal.  Eyes:     Pupils: Pupils are equal, round, and reactive to light.  Neck:     Thyroid: No thyromegaly.  Cardiovascular:     Rate and Rhythm: Normal rate and regular rhythm.     Heart sounds: Normal heart sounds. No murmur heard. Pulmonary:     Effort: Pulmonary effort is normal. No respiratory distress.     Breath sounds: Normal breath sounds. No wheezing.  Abdominal:     General: Bowel sounds are normal. There is no distension.     Palpations: Abdomen is soft.     Tenderness: There is no abdominal tenderness.  Musculoskeletal:        General: No tenderness.     Cervical back: Normal range of motion and neck supple.     Right lower leg: Edema (trace) present.     Left lower leg: Edema (trace) present.  Comments: Pain in lumbar with flexion, pain in right knee with weight bearing  Skin:    General: Skin is warm and dry.  Neurological:     Mental Status: She is alert and oriented to person, place, and time.     Cranial Nerves: No cranial nerve deficit.     Deep Tendon Reflexes: Reflexes are normal and symmetric.  Psychiatric:        Behavior: Behavior normal.        Thought Content: Thought content normal.        Judgment: Judgment normal.      BP 122/72   Pulse 77   Temp 97.9 F (36.6 C) (Temporal)    Ht 6' (1.829 m)   Wt 200 lb 6.4 oz (90.9 kg)   BMI 27.18 kg/m      Assessment & Plan:  Molly Castaneda comes in today with chief complaint of Medical Management of Chronic Issues   Diagnosis and orders addressed:  1. GAD (generalized anxiety disorder) - buPROPion (WELLBUTRIN SR) 150 MG 12 hr tablet; Take 1 tablet (150 mg total) by mouth 2 (two) times daily. (Needs to be seen before next refill)  Dispense: 60 tablet; Refill: 0 - CMP14+EGFR - CBC with Differential/Platelet  2. Depression, unspecified depression type - buPROPion (WELLBUTRIN SR) 150 MG 12 hr tablet; Take 1 tablet (150 mg total) by mouth 2 (two) times daily. (Needs to be seen before next refill)  Dispense: 60 tablet; Refill: 0 - CMP14+EGFR - CBC with Differential/Platelet  3. Peripheral edema - furosemide (LASIX) 40 MG tablet; Take 1 tablet (40 mg total) by mouth daily. (Needs to be seen before next refill)  Dispense: 30 tablet; Refill: 0 - CMP14+EGFR - CBC with Differential/Platelet  4. Drug-induced constipation - naloxegol oxalate (MOVANTIK) 25 MG TABS tablet; Take 1 tablet (25 mg total) by mouth daily.  Dispense: 90 tablet; Refill: 2 - CMP14+EGFR - CBC with Differential/Platelet  5. Chronic constipation  - CMP14+EGFR - CBC with Differential/Platelet  6. Overweight (BMI 25.0-29.9) - CMP14+EGFR - CBC with Differential/Platelet  7. Hyperlipidemia, unspecified hyperlipidemia type  - CMP14+EGFR - CBC with Differential/Platelet  8. Chronic left-sided low back pain with left-sided sciatica - CMP14+EGFR - CBC with Differential/Platelet  9. Left sided sciatica  - CMP14+EGFR - CBC with Differential/Platelet   Labs pending Health Maintenance reviewed Diet and exercise encouraged  Follow up plan: 6 months and keep specialists appt    Evelina Dun, FNP

## 2020-11-17 DIAGNOSIS — F332 Major depressive disorder, recurrent severe without psychotic features: Secondary | ICD-10-CM | POA: Diagnosis not present

## 2020-11-17 DIAGNOSIS — F4323 Adjustment disorder with mixed anxiety and depressed mood: Secondary | ICD-10-CM | POA: Diagnosis not present

## 2020-11-17 DIAGNOSIS — F411 Generalized anxiety disorder: Secondary | ICD-10-CM | POA: Diagnosis not present

## 2020-11-17 DIAGNOSIS — F1120 Opioid dependence, uncomplicated: Secondary | ICD-10-CM | POA: Diagnosis not present

## 2020-11-17 DIAGNOSIS — R69 Illness, unspecified: Secondary | ICD-10-CM | POA: Diagnosis not present

## 2020-11-21 ENCOUNTER — Telehealth: Payer: Self-pay

## 2020-11-21 NOTE — Telephone Encounter (Signed)
   Telephone encounter was:  Unsuccessful.  11/21/2020 Name: Molly Castaneda MRN: [id number] DOB: 08-17-1960  Unsuccessful outbound call made today to assist with:  food pantries and Medicare Extrahelp.   Outreach Attempt:  2nd Attempt  A HIPAA compliant voice message was left requesting a return call.  Instructed patient to call back at [phone].  Arrion Burruel, AAS Paralegal, [facility] Care Guide  Embedded Care Coordination [place]  Care Management  [address] [place], [place] [address] ??[email or web address]  ?? [phone]   [email or web address]

## 2020-11-22 ENCOUNTER — Telehealth: Payer: Self-pay

## 2020-11-22 NOTE — Telephone Encounter (Signed)
   Telephone encounter was:  Unsuccessful.  11/22/2020 Name: Molly Castaneda MRN: [id number] DOB: [birth year]  Unsuccessful outbound call made today to assist with:  Food Insecurity and Medicare Extrahelp.  Outreach Attempt:  3rd Attempt.  Referral closed unable to contact patient.  A HIPAA compliant voice message was left requesting a return call.  Instructed patient to call back at [phone].  Aiken Withem, AAS Paralegal, [facility] Care Guide  Embedded Care Coordination [place]  Care Management  [address] [place], [place] [address] ??[email or web address]  ?? [phone]   [email or web address]

## 2020-12-07 ENCOUNTER — Other Ambulatory Visit: Payer: Self-pay

## 2020-12-07 ENCOUNTER — Encounter: Payer: Self-pay | Admitting: Orthopaedic Surgery

## 2020-12-07 ENCOUNTER — Ambulatory Visit (INDEPENDENT_AMBULATORY_CARE_PROVIDER_SITE_OTHER): Payer: Medicare HMO | Admitting: Orthopaedic Surgery

## 2020-12-07 DIAGNOSIS — M25561 Pain in right knee: Secondary | ICD-10-CM

## 2020-12-07 NOTE — Progress Notes (Signed)
Office Visit Note   Patient: Molly Castaneda           Date of Birth: 1961/01/08           MRN: [id number] Visit Date: 12/07/2020              Requested by: [name], FNP [address] [place],  [place] [address] PCP: [name], FNP   Assessment & Plan: Visit Diagnoses: No diagnosis found.  Plan: Patient is 4 months status post fall onto her knee.  She says her knee is still feeling a little bit better but still sore when she tries to kneel on it.  She has tried steroid injection which did not help it.  She is tried topical Voltaren which she said initially made it feel worse.  She had a CT scan which was reviewed today.  The CT scan did demonstrate mild tripod compartmental arthritis.  Did not show any osseous acute injuries.  Discussed with patient this is most likely a bone bruise though it has been going on longer than we would expect.  She also has a long history of back issues and has a spinal stimulator.  Given the pins-and-needles feeling cannot rule out that this is not coming from her spine could also be an exacerbation of her arthritis from the fall.  Encouraged that she do some conditioning for her knee which she is done in the past.  Would not consider a steroid injection today because this did not help her in the past and made it worse  Follow-Up Instructions: No follow-ups on file.   Orders:  No orders of the defined types were placed in this encounter.  No orders of the defined types were placed in this encounter.     Procedures: No procedures performed   Clinical Data: No additional findings.   Subjective: No chief complaint on file. Patient presents today for follow up on her right knee. She had a CT scan and is here today to discuss those results.    Review of Systems  All other systems reviewed and are negative.   Objective: Vital Signs: There were no vitals taken for this visit.  Patient is alert and oriented appears comfortable to  exam normal respiratory effort  Ortho Exam Examination of her knee she has no effusion no erythema no swelling.  She still has some tenderness to palpation across the proximal tibial plateau centrally.  No real pain in the medial lateral joint line.  She does have some crepitus with range of motion of her knee she is able to hold her leg out actively without any tenderness in the patella tendon Specialty Comments:  No specialty comments available.  Imaging: No results found.   PMFS History: Patient Active Problem List   Diagnosis Date Noted   Pain in right leg 07/19/2020   Pain in right knee 07/19/2020   Chronic constipation    Hyperlipemia 10/14/2018   Adhesive capsulitis of left shoulder 05/14/2018   Chronic left shoulder pain 09/18/2017   Cervicalgia 09/18/2017   Overweight (BMI 25.0-29.9) 05/20/2017   Peripheral edema 09/07/2015   Vitamin D deficiency 06/22/2014   Depression 06/21/2014   GAD (generalized anxiety disorder) 06/21/2014   Back pain 06/21/2014   Left ankle pain 10/16/2013   Osteopenia 02/11/2013   Left sided sciatica 01/11/2012   Constipation 05/08/2010   Past Medical History:  Diagnosis Date   Anxiety    Arthritis    Chronic back pain  Chronic constipation    Depression    MVP (mitral valve prolapse)    had ablation 1998   Numbness in left leg    s/p spinal surgery   Supraventricular tachycardia (HCC)    ablation 1998   Vitamin D deficiency     Family History  Problem Relation Age of Onset   Coronary artery disease Father    Colon polyps Father    Heart disease Father    Prostate cancer Maternal Grandfather    Hodgkin's lymphoma Son    Liver cancer Paternal Uncle        ?   Colon polyps Paternal Uncle    Arthritis Mother    Colon cancer Neg Hx    Esophageal cancer Neg Hx    Rectal cancer Neg Hx    Stomach cancer Neg Hx     Past Surgical History:  Procedure Laterality Date   ABDOMINAL HYSTERECTOMY     AV NODE ABLATION  1998   for  svt   BACK SURGERY  2001   lumbar fusion   COLONOSCOPY  2010   PARTIAL HYSTERECTOMY  1993   REPAIR EXTENSOR TENDON Left 03/06/2013   Procedure: LEFT LONG BOUTONNIERE REPAIR;  Surgeon: Wyn Forster., MD;  Location: [facility];  Service: Orthopedics;  Laterality: Left;   SPINE SURGERY     stimulation system implant  07/31/2000   lumbar nerve stimulator-none funtioning now   Social History   Occupational History   Occupation: disabled    Associate Professor: UNEMPLOYED  Tobacco Use   Smoking status: Never   Smokeless tobacco: Never  Vaping Use   Vaping Use: Never used  Substance and Sexual Activity   Alcohol use: No   Drug use: No   Sexual activity: Yes    Birth control/protection: None

## 2020-12-11 ENCOUNTER — Other Ambulatory Visit: Payer: Self-pay | Admitting: Family

## 2020-12-11 DIAGNOSIS — R609 Edema, unspecified: Secondary | ICD-10-CM

## 2021-01-11 DIAGNOSIS — F4323 Adjustment disorder with mixed anxiety and depressed mood: Secondary | ICD-10-CM | POA: Diagnosis not present

## 2021-01-11 DIAGNOSIS — F1120 Opioid dependence, uncomplicated: Secondary | ICD-10-CM | POA: Diagnosis not present

## 2021-01-11 DIAGNOSIS — R69 Illness, unspecified: Secondary | ICD-10-CM | POA: Diagnosis not present

## 2021-01-11 DIAGNOSIS — F332 Major depressive disorder, recurrent severe without psychotic features: Secondary | ICD-10-CM | POA: Diagnosis not present

## 2021-01-11 DIAGNOSIS — F411 Generalized anxiety disorder: Secondary | ICD-10-CM | POA: Diagnosis not present

## 2021-01-17 DIAGNOSIS — F4323 Adjustment disorder with mixed anxiety and depressed mood: Secondary | ICD-10-CM | POA: Diagnosis not present

## 2021-01-17 DIAGNOSIS — F1120 Opioid dependence, uncomplicated: Secondary | ICD-10-CM | POA: Diagnosis not present

## 2021-01-17 DIAGNOSIS — R69 Illness, unspecified: Secondary | ICD-10-CM | POA: Diagnosis not present

## 2021-01-17 DIAGNOSIS — F411 Generalized anxiety disorder: Secondary | ICD-10-CM | POA: Diagnosis not present

## 2021-01-17 DIAGNOSIS — F332 Major depressive disorder, recurrent severe without psychotic features: Secondary | ICD-10-CM | POA: Diagnosis not present

## 2021-02-02 ENCOUNTER — Ambulatory Visit (INDEPENDENT_AMBULATORY_CARE_PROVIDER_SITE_OTHER): Payer: Medicare HMO | Admitting: Family

## 2021-02-02 ENCOUNTER — Encounter: Payer: Self-pay | Admitting: Family

## 2021-02-02 VITALS — BP 118/81 | HR 102 | Temp 98.3°F | Wt 198.0 lb

## 2021-02-02 DIAGNOSIS — R6889 Other general symptoms and signs: Secondary | ICD-10-CM

## 2021-02-02 DIAGNOSIS — R52 Pain, unspecified: Secondary | ICD-10-CM

## 2021-02-02 LAB — VERITOR FLU A/B WAIVED
Influenza A: NEGATIVE
Influenza B: NEGATIVE

## 2021-02-02 MED ORDER — PREDNISONE 10 MG (21) PO TBPK: ORAL_TABLET | ORAL | 0 refills | Status: DC

## 2021-02-02 MED ORDER — BENZONATATE 200 MG PO CAPS: 200.0000 mg | ORAL_CAPSULE | Freq: Three times a day (TID) | ORAL | 1 refills | Status: DC | PRN

## 2021-02-02 MED ORDER — FLUTICASONE PROPIONATE 50 MCG/ACT NA SUSP: 2.0000 | Freq: Every day | NASAL | 6 refills | Status: DC

## 2021-02-02 NOTE — Patient Instructions (Signed)

## 2021-02-02 NOTE — Progress Notes (Signed)
Subjective:    Patient ID: Molly Castaneda, female    DOB: Jun 17, 1960, 60 y.o.   MRN: [id number]  Chief Complaint  Patient presents with   Cough   Generalized Body Aches    Started Tuesday    Pt presents to the office today with cough that started two days ago.  Cough This is a new problem. The current episode started in the past 7 days. The problem has been gradually worsening. The problem occurs every few minutes. The cough is Productive of sputum. Associated symptoms include chills, headaches, myalgias, nasal congestion, postnasal drip, rhinorrhea, shortness of breath and wheezing. Pertinent negatives include no ear congestion, ear pain or fever. The symptoms are aggravated by lying down. She has tried rest and OTC cough suppressant for the symptoms. The treatment provided mild relief.     Review of Systems  Constitutional:  Positive for chills. Negative for fever.  HENT:  Positive for postnasal drip and rhinorrhea. Negative for ear pain.   Respiratory:  Positive for cough, shortness of breath and wheezing.   Musculoskeletal:  Positive for myalgias.  Neurological:  Positive for headaches.  All other systems reviewed and are negative.     Objective:   Physical Exam Vitals reviewed.  Constitutional:      General: She is not in acute distress.    Appearance: She is well-developed.  HENT:     Head: Normocephalic and atraumatic.     Right Ear: Tympanic membrane normal.     Left Ear: Tympanic membrane normal.     Nose: Congestion and rhinorrhea present.  Eyes:     Pupils: Pupils are equal, round, and reactive to light.  Neck:     Thyroid: No thyromegaly.  Cardiovascular:     Rate and Rhythm: Normal rate and regular rhythm.     Heart sounds: Normal heart sounds. No murmur heard. Pulmonary:     Effort: Pulmonary effort is normal. No respiratory distress.     Breath sounds: Rhonchi present. No wheezing.     Comments: cough Abdominal:     General: Bowel sounds are normal.  There is no distension.     Palpations: Abdomen is soft.     Tenderness: There is no abdominal tenderness.  Musculoskeletal:        General: No tenderness. Normal range of motion.     Cervical back: Normal range of motion and neck supple.  Skin:    General: Skin is warm and dry.  Neurological:     Mental Status: She is alert and oriented to person, place, and time.     Cranial Nerves: No cranial nerve deficit.     Deep Tendon Reflexes: Reflexes are normal and symmetric.  Psychiatric:        Behavior: Behavior normal.        Thought Content: Thought content normal.        Judgment: Judgment normal.     BP 118/81    Pulse (!) 102    Temp 98.3 F (36.8 C) (Temporal)    Wt 198 lb (89.8 kg)    BMI 26.85 kg/m       Assessment & Plan:  Molly Castaneda comes in today with chief complaint of Cough and Generalized Body Aches (Started Tuesday )   Diagnosis and orders addressed:  1. Body aches - Novel Coronavirus, NAA ([facility]) - Veritor Flu A/B Waived  2. Flu-like symptoms Rest, force fluids, tylenol as needed, report any worsening symptoms such as increased shortness of  breath, swelling, or continued high fevers.  - predniSONE (STERAPRED UNI-PAK 21 TAB) 10 MG (21) TBPK tablet; Use as directed  Dispense: 21 tablet; Refill: 0 - benzonatate (TESSALON) 200 MG capsule; Take 1 capsule (200 mg total) by mouth 3 (three) times daily as needed.  Dispense: 30 capsule; Refill: 1 - fluticasone (FLONASE) 50 MCG/ACT nasal spray; Place 2 sprays into both nostrils daily.  Dispense: 16 g; Refill: 6   [name], FNP

## 2021-02-03 ENCOUNTER — Other Ambulatory Visit: Payer: Self-pay | Admitting: Family

## 2021-02-03 LAB — SARS-COV-2, NAA 2 DAY TAT

## 2021-02-03 LAB — NOVEL CORONAVIRUS, NAA: SARS-CoV-2, NAA: DETECTED — AB

## 2021-02-03 MED ORDER — MOLNUPIRAVIR EUA 200MG CAPSULE: 4.0000 | ORAL_CAPSULE | Freq: Two times a day (BID) | ORAL | 0 refills | Status: AC

## 2021-02-03 NOTE — Progress Notes (Signed)
Patient returned call and results were given and patient understood to call back if symptoms worsen.

## 2021-02-09 ENCOUNTER — Telehealth: Payer: Self-pay | Admitting: Family

## 2021-02-09 MED ORDER — FLUCONAZOLE 150 MG PO TABS: 150.0000 mg | ORAL_TABLET | Freq: Once | ORAL | 0 refills | Status: AC

## 2021-02-09 MED ORDER — DOXYCYCLINE HYCLATE 100 MG PO TABS: 100.0000 mg | ORAL_TABLET | Freq: Two times a day (BID) | ORAL | 0 refills | Status: DC

## 2021-02-09 NOTE — Addendum Note (Signed)
Addended by: Brynda Peon F on: 02/09/2021 02:57 PM   Modules accepted: Orders

## 2021-02-09 NOTE — Telephone Encounter (Signed)
Patient tested + for COVID over a week ago still having chest tightness and bad cough and sweats. Rattling in Chest. Should we ask patient to come and get chest xray? Please advise?

## 2021-02-09 NOTE — Telephone Encounter (Signed)
Pt called requesting to speak with [facility] or nurse. Says she tested positive for COVID and needs advise. Pt says she already has medicine.

## 2021-02-09 NOTE — Telephone Encounter (Signed)
Patient aware and verbalized understanding. °

## 2021-02-09 NOTE — Telephone Encounter (Signed)
Doxycycline Prescription sent to pharmacy.   [name], FNP

## 2021-02-28 DIAGNOSIS — F1120 Opioid dependence, uncomplicated: Secondary | ICD-10-CM | POA: Diagnosis not present

## 2021-02-28 DIAGNOSIS — F332 Major depressive disorder, recurrent severe without psychotic features: Secondary | ICD-10-CM | POA: Diagnosis not present

## 2021-02-28 DIAGNOSIS — F411 Generalized anxiety disorder: Secondary | ICD-10-CM | POA: Diagnosis not present

## 2021-02-28 DIAGNOSIS — F4323 Adjustment disorder with mixed anxiety and depressed mood: Secondary | ICD-10-CM | POA: Diagnosis not present

## 2021-03-12 ENCOUNTER — Other Ambulatory Visit: Payer: Self-pay | Admitting: Family

## 2021-03-12 DIAGNOSIS — R609 Edema, unspecified: Secondary | ICD-10-CM

## 2021-03-13 DIAGNOSIS — F4323 Adjustment disorder with mixed anxiety and depressed mood: Secondary | ICD-10-CM | POA: Diagnosis not present

## 2021-03-13 DIAGNOSIS — F332 Major depressive disorder, recurrent severe without psychotic features: Secondary | ICD-10-CM | POA: Diagnosis not present

## 2021-03-13 DIAGNOSIS — F1120 Opioid dependence, uncomplicated: Secondary | ICD-10-CM | POA: Diagnosis not present

## 2021-03-13 DIAGNOSIS — F411 Generalized anxiety disorder: Secondary | ICD-10-CM | POA: Diagnosis not present

## 2021-03-23 DIAGNOSIS — F411 Generalized anxiety disorder: Secondary | ICD-10-CM | POA: Diagnosis not present

## 2021-03-23 DIAGNOSIS — F4323 Adjustment disorder with mixed anxiety and depressed mood: Secondary | ICD-10-CM | POA: Diagnosis not present

## 2021-03-23 DIAGNOSIS — F332 Major depressive disorder, recurrent severe without psychotic features: Secondary | ICD-10-CM | POA: Diagnosis not present

## 2021-03-23 DIAGNOSIS — F1120 Opioid dependence, uncomplicated: Secondary | ICD-10-CM | POA: Diagnosis not present

## 2021-03-24 DIAGNOSIS — M961 Postlaminectomy syndrome, not elsewhere classified: Secondary | ICD-10-CM | POA: Diagnosis not present

## 2021-03-24 DIAGNOSIS — Z79891 Long term (current) use of opiate analgesic: Secondary | ICD-10-CM | POA: Diagnosis not present

## 2021-03-24 DIAGNOSIS — F1190 Opioid use, unspecified, uncomplicated: Secondary | ICD-10-CM | POA: Diagnosis not present

## 2021-03-24 DIAGNOSIS — G8929 Other chronic pain: Secondary | ICD-10-CM | POA: Diagnosis not present

## 2021-03-24 DIAGNOSIS — M545 Low back pain, unspecified: Secondary | ICD-10-CM | POA: Diagnosis not present

## 2021-03-24 DIAGNOSIS — G894 Chronic pain syndrome: Secondary | ICD-10-CM | POA: Diagnosis not present

## 2021-04-13 DIAGNOSIS — F411 Generalized anxiety disorder: Secondary | ICD-10-CM | POA: Diagnosis not present

## 2021-04-13 DIAGNOSIS — F332 Major depressive disorder, recurrent severe without psychotic features: Secondary | ICD-10-CM | POA: Diagnosis not present

## 2021-04-13 DIAGNOSIS — F1120 Opioid dependence, uncomplicated: Secondary | ICD-10-CM | POA: Diagnosis not present

## 2021-04-13 DIAGNOSIS — F4323 Adjustment disorder with mixed anxiety and depressed mood: Secondary | ICD-10-CM | POA: Diagnosis not present

## 2021-04-19 ENCOUNTER — Ambulatory Visit (INDEPENDENT_AMBULATORY_CARE_PROVIDER_SITE_OTHER): Payer: Medicare HMO | Admitting: Family Medicine

## 2021-04-19 ENCOUNTER — Encounter: Payer: Self-pay | Admitting: Family Medicine

## 2021-04-19 DIAGNOSIS — J019 Acute sinusitis, unspecified: Secondary | ICD-10-CM | POA: Diagnosis not present

## 2021-04-19 MED ORDER — AMOXICILLIN 500 MG PO CAPS: 500.0000 mg | ORAL_CAPSULE | Freq: Two times a day (BID) | ORAL | 0 refills | Status: DC

## 2021-04-19 MED ORDER — FLUCONAZOLE 150 MG PO TABS: 150.0000 mg | ORAL_TABLET | Freq: Once | ORAL | 0 refills | Status: AC

## 2021-04-19 NOTE — Progress Notes (Signed)
? ?Virtual Visit via telephone Note ? ?I connected with Molly Castaneda on 04/19/21 at 1727 by telephone and verified that I am speaking with the correct person using two identifiers. Molly Castaneda is currently located at home and patient are currently with her during visit. The provider, Fransisca Kaufmann Shawne Eskelson, MD is located in their office at time of visit. ? ?Call ended at 1733 ? ?I discussed the limitations, risks, security and privacy concerns of performing an evaluation and management service by telephone and the availability of in person appointments. I also discussed with the patient that there may be a patient responsible charge related to this service. The patient expressed understanding and agreed to proceed. ? ? ?History and Present Illness: ?Patient is calling in for sinus pressure and hoarseness and eye pressure and sinus pressure.  She is coughing but is dry.  She denies SOB or fevers or chills or wheezing. She is using sinus tablet and mucinex and zinc and they are not helping.  She started yesterday with symptoms.  ? ?1. Acute non-recurrent sinusitis, unspecified location   ? ? ?Outpatient Encounter Medications as of 04/19/2021  ?Medication Sig  ? amoxicillin (AMOXIL) 500 MG capsule Take 1 capsule (500 mg total) by mouth 2 (two) times daily.  ? fluconazole (DIFLUCAN) 150 MG tablet Take 1 tablet (150 mg total) by mouth once for 1 dose.  ? amphetamine-dextroamphetamine (ADDERALL) 10 MG tablet Take 10 mg by mouth 2 (two) times daily with a meal.   ? aspirin 81 MG chewable tablet Chew by mouth daily.  ? benzonatate (TESSALON) 200 MG capsule Take 1 capsule (200 mg total) by mouth 3 (three) times daily as needed.  ? buPROPion (WELLBUTRIN SR) 150 MG 12 hr tablet Take 1 tablet (150 mg total) by mouth 2 (two) times daily. (Needs to be seen before next refill)  ? Calcium Carbonate 500 MG CHEW Chew 1 tablet (500 mg total) by mouth daily.  ? diclofenac (VOLTAREN) 75 MG EC tablet TAKE 1 TABLET BY MOUTH TWICE A DAY  ?  estradiol (ESTRACE) 1 MG tablet Take 1 tablet (1 mg total) by mouth daily.  ? fluticasone (FLONASE) 50 MCG/ACT nasal spray Place 2 sprays into both nostrils daily.  ? furosemide (LASIX) 40 MG tablet TAKE 1 TABLET BY MOUTH EVERY DAY  ? mirtazapine (REMERON) 7.5 MG tablet Take 7.5 mg by mouth at bedtime.  ? morphine (MS CONTIN) 15 MG 12 hr tablet Take 15 mg by mouth 3 (three) times daily.  ? naloxegol oxalate (MOVANTIK) 25 MG TABS tablet Take 1 tablet (25 mg total) by mouth daily.  ? predniSONE (STERAPRED UNI-PAK 21 TAB) 10 MG (21) TBPK tablet Use as directed  ? tiZANidine (ZANAFLEX) 4 MG tablet Take 1 Tablet by mouth at bedtime as needed FOR MUSCLE SPASMS  ? topiramate (TOPAMAX) 50 MG tablet Take 50 mg by mouth 2 (two) times daily.  ? vitamin B-12 (CYANOCOBALAMIN) 1000 MCG tablet Take 1,000 mcg by mouth daily.  ? Vitamin D, Cholecalciferol, 1000 units TABS Take 1,000 Units by mouth daily.  ? [DISCONTINUED] doxycycline (VIBRA-TABS) 100 MG tablet Take 1 tablet (100 mg total) by mouth 2 (two) times daily.  ? ?No facility-administered encounter medications on file as of 04/19/2021.  ? ? ?Review of Systems  ?Constitutional:  Negative for chills and fever.  ?HENT:  Positive for congestion, sinus pressure, sneezing, sore throat and voice change. Negative for ear discharge, ear pain, postnasal drip, rhinorrhea and trouble swallowing.   ?Eyes:  Negative for pain, redness and visual disturbance.  ?Respiratory:  Positive for cough. Negative for chest tightness and shortness of breath.   ?Cardiovascular:  Negative for chest pain and leg swelling.  ?Genitourinary:  Negative for difficulty urinating and dysuria.  ?Musculoskeletal:  Negative for back pain and gait problem.  ?Skin:  Negative for rash.  ?Neurological:  Negative for light-headedness and headaches.  ?Psychiatric/Behavioral:  Negative for agitation and behavioral problems.   ?All other systems reviewed and are negative. ? ?Observations/Objective: ?Patient sounds hoarse  but otherwise sounds comfortable and in no acute distress. ? ?Assessment and Plan: ?Problem List Items Addressed This Visit   ?None ?Visit Diagnoses   ? ? Acute non-recurrent sinusitis, unspecified location    -  Primary  ? Relevant Medications  ? amoxicillin (AMOXIL) 500 MG capsule  ? fluconazole (DIFLUCAN) 150 MG tablet  ? ?  ?  ?Sounds like sinus infection, will go ahead and treat like sinus. ?Follow up plan: ?Return if symptoms worsen or fail to improve. ? ? ?  ?I discussed the assessment and treatment plan with the patient. The patient was provided an opportunity to ask questions and all were answered. The patient agreed with the plan and demonstrated an understanding of the instructions. ?  ?The patient was advised to call back or seek an in-person evaluation if the symptoms worsen or if the condition fails to improve as anticipated. ? ?The above assessment and management plan was discussed with the patient. The patient verbalized understanding of and has agreed to the management plan. Patient is aware to call the clinic if symptoms persist or worsen. Patient is aware when to return to the clinic for a follow-up visit. Patient educated on when it is appropriate to go to the emergency department.  ? ? ?I provided 6 minutes of non-face-to-face time during this encounter. ? ? ? ?Worthy Rancher, MD ?  ? ?

## 2021-05-01 ENCOUNTER — Other Ambulatory Visit: Payer: Self-pay | Admitting: *Deleted

## 2021-05-01 DIAGNOSIS — R609 Edema, unspecified: Secondary | ICD-10-CM

## 2021-05-01 MED ORDER — FUROSEMIDE 40 MG PO TABS: 40.0000 mg | ORAL_TABLET | Freq: Every day | ORAL | 0 refills | Status: DC

## 2021-05-01 NOTE — Telephone Encounter (Signed)
Appt made for 04/03/223 ?

## 2021-05-01 NOTE — Telephone Encounter (Signed)
[facility] NTBS in April for 6 mos fu. Mail order SENT ?

## 2021-05-02 ENCOUNTER — Telehealth: Payer: Self-pay | Admitting: Orthopaedic Surgery

## 2021-05-02 NOTE — Telephone Encounter (Signed)
Called patient. Scheduled appointment for tomorrow.  °

## 2021-05-02 NOTE — Telephone Encounter (Signed)
Spoke with patient. She developed more pain in her right knee last week. Coincidently she also had an ingrown hair at the groin area that busted and had purulent drainage last week. She wants to know if it's possible she has infection that went down her leg from the ingrown hair? ?Please advise. ?[phone] ?

## 2021-05-02 NOTE — Telephone Encounter (Signed)
Unlikely...but best to have someone look at her knee if still symptomatic.

## 2021-05-02 NOTE — Telephone Encounter (Signed)
Please call the pt on her cell number  ?

## 2021-05-03 ENCOUNTER — Ambulatory Visit: Payer: Medicare HMO | Admitting: Orthopaedic Surgery

## 2021-05-03 ENCOUNTER — Encounter: Payer: Self-pay | Admitting: Orthopaedic Surgery

## 2021-05-03 DIAGNOSIS — M25561 Pain in right knee: Secondary | ICD-10-CM | POA: Diagnosis not present

## 2021-05-03 MED ORDER — BUPIVACAINE HCL 0.25 % IJ SOLN
2.0000 mL | INTRAMUSCULAR | Status: AC | PRN
  Administered 2021-05-03: 2 mL via INTRA_ARTICULAR

## 2021-05-03 MED ORDER — LIDOCAINE HCL 1 % IJ SOLN
2.0000 mL | INTRAMUSCULAR | Status: AC | PRN
  Administered 2021-05-03: 2 mL

## 2021-05-03 MED ORDER — METHYLPREDNISOLONE ACETATE 40 MG/ML IJ SUSP
80.0000 mg | INTRAMUSCULAR | Status: AC | PRN
  Administered 2021-05-03: 80 mg via INTRA_ARTICULAR

## 2021-05-03 NOTE — Progress Notes (Signed)
? ?Office Visit Note ?  ?Patient: Molly Castaneda           ?Date of Birth: 04-11-1960           ?MRN: [id number] ?Visit Date: 05/03/2021 ?             ?Requested by: [name], FNP ?[address] ?[place],  [place] [address] ?PCP: [name], FNP ? ? ?Assessment & Plan: ?Visit Diagnoses:  ?1. Acute pain of right knee   ? ? ?Plan: Ms. karalina tift that she had an infected hair follicle last week in her right groin that drained "pus".  Since then she has not had any problems other than some recurrent pain in her right knee.  There has been a suggestion that she has had arthritis in that knee in the past with possible relief with cortisone.  Her right knee was a little warm so I aspirated 30 cc of clear fluid.  I sent it to the lab but it certainly does not look like it is infected.  I think it is all related to her arthritis.  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  I did inject cortisone after discussing it with her and we will plan to check back with her next week or 2 if there is no improvement ? ?Follow-Up Instructions: Return if symptoms worsen or fail to improve.  ? ?Orders:  ?No orders of the defined types were placed in this encounter. ? ?No orders of the defined types were placed in this encounter. ? ? ? ? Procedures: ?Large Joint Inj: R knee on 05/03/2021 3:04 PM ?Indications: pain and diagnostic evaluation ?Details: 25 G 1.5 in needle, anteromedial approach ? ?Arthrogram: No ? ?Medications: 2 mL lidocaine 1 %; 80 mg methylPREDNISolone acetate 40 MG/ML; 2 mL bupivacaine 0.25 % ?Aspirate: 30 mL clear; sent for lab analysis ?Outcome: tolerated well, no immediate complications ?Procedure, treatment alternatives, risks and benefits explained, specific risks discussed. Consent was given by the patient. Immediately prior to procedure a time out was called to verify the correct patient, procedure, equipment, support staff and site/side marked as required.  Patient was prepped and draped in the usual sterile fashion.  ? ? ? ? ?Clinical Data: ?No additional findings. ? ? ?Subjective: ?Chief Complaint  ?Patient presents with  ? Right Knee - Pain  ?Patient presents today for right knee pain. She states that she has had chronic right knee pain that worsened last week. She states that she had an infected ingrown groin hair last week and it seemed that her knee got worse at the same time. ? ?HPI ? ?Review of Systems ? ? ?Objective: ?Vital Signs: There were no vitals taken for this visit. ? ?Physical Exam ?Constitutional:   ?   Appearance: She is well-developed.  ?Eyes:  ?   Pupils: Pupils are equal, round, and reactive to light.  ?Pulmonary:  ?   Effort: Pulmonary effort is normal.  ?Skin: ?   General: Skin is warm and dry.  ?Neurological:  ?   Mental Status: She is alert and oriented to person, place, and time.  ?Psychiatric:     ?   Behavior: Behavior normal.  ? ? ?Ortho Exam right knee was a little warm compared to the left knee full extension of flexed about 95 to 100 degrees.  Large knees.  Some very mild tenderness along the medial and lateral compartments.  None  about the patella.  No popliteal mass or cyst.  No calf pain.  Skin intact.  No pain with range of motion of her hip ? ?Specialty Comments:  ?No specialty comments available. ? ?Imaging: ?No results found. ? ? ?PMFS History: ?Patient Active Problem List  ? Diagnosis Date Noted  ? Pain in right leg 07/19/2020  ? Pain in right knee 07/19/2020  ? Chronic constipation   ? Hyperlipemia 10/14/2018  ? Adhesive capsulitis of left shoulder 05/14/2018  ? Chronic left shoulder pain 09/18/2017  ? Cervicalgia 09/18/2017  ? Overweight (BMI 25.0-29.9) 05/20/2017  ? Peripheral edema 09/07/2015  ? Vitamin D deficiency 06/22/2014  ? Depression 06/21/2014  ? GAD (generalized anxiety disorder) 06/21/2014  ? Back pain 06/21/2014  ? Left ankle pain 10/16/2013  ? Osteopenia 02/11/2013  ? Left sided sciatica 01/11/2012  ?  Constipation 05/08/2010  ? ?Past Medical History:  ?Diagnosis Date  ? Anxiety   ? Arthritis   ? Chronic back pain   ? Chronic constipation   ? Depression   ? MVP (mitral valve prolapse)   ? had ablation 1998  ? Numbness in left leg   ? s/p spinal surgery  ? Supraventricular tachycardia (HCC)   ? ablation 1998  ? Vitamin D deficiency   ?  ?Family History  ?Problem Relation Age of Onset  ? Coronary artery disease Father   ? Colon polyps Father   ? Heart disease Father   ? Prostate cancer Maternal Grandfather   ? Hodgkin's lymphoma Son   ? Liver cancer Paternal Uncle   ?     ?  ? Colon polyps Paternal Uncle   ? Arthritis Mother   ? Colon cancer Neg Hx   ? Esophageal cancer Neg Hx   ? Rectal cancer Neg Hx   ? Stomach cancer Neg Hx   ?  ?Past Surgical History:  ?Procedure Laterality Date  ? ABDOMINAL HYSTERECTOMY    ? AV NODE ABLATION  1998  ? for svt  ? BACK SURGERY  2001  ? lumbar fusion  ? COLONOSCOPY  2010  ? PARTIAL HYSTERECTOMY  1993  ? REPAIR EXTENSOR TENDON Left 03/06/2013  ? Procedure: LEFT LONG BOUTONNIERE REPAIR;  Surgeon: Wyn Forster., MD;  Location: [facility];  Service: Orthopedics;  Laterality: Left;  ? SPINE SURGERY    ? stimulation system implant  07/31/2000  ? lumbar nerve stimulator-none funtioning now  ? ?Social History  ? ?Occupational History  ? Occupation: disabled  ?  Employer: UNEMPLOYED  ?Tobacco Use  ? Smoking status: Never  ? Smokeless tobacco: Never  ?Vaping Use  ? Vaping Use: Never used  ?Substance and Sexual Activity  ? Alcohol use: No  ? Drug use: No  ? Sexual activity: Yes  ?  Birth control/protection: None  ? ? ? ? ? ? ?

## 2021-05-03 NOTE — Addendum Note (Signed)
Addended by: Wendi Maya on: 05/03/2021 03:11 PM ? ? Modules accepted: Orders ? ?

## 2021-05-04 DIAGNOSIS — F332 Major depressive disorder, recurrent severe without psychotic features: Secondary | ICD-10-CM | POA: Diagnosis not present

## 2021-05-04 DIAGNOSIS — F4323 Adjustment disorder with mixed anxiety and depressed mood: Secondary | ICD-10-CM | POA: Diagnosis not present

## 2021-05-04 DIAGNOSIS — F1120 Opioid dependence, uncomplicated: Secondary | ICD-10-CM | POA: Diagnosis not present

## 2021-05-04 LAB — SYNOVIAL FLUID ANALYSIS, COMPLETE
Basophils, %: 0 %
Eosinophils-Synovial: 0 % (ref 0–2)
Lymphocytes-Synovial Fld: 22 % (ref 0–74)
Monocyte/Macrophage: 72 % — ABNORMAL HIGH (ref 0–69)
Neutrophil, Synovial: 6 % (ref 0–24)
Synoviocytes, %: 0 % (ref 0–15)
WBC, Synovial: 693 cells/uL — ABNORMAL HIGH (ref <150)

## 2021-05-08 ENCOUNTER — Ambulatory Visit: Payer: Medicare HMO | Admitting: Family

## 2021-05-11 DIAGNOSIS — F4323 Adjustment disorder with mixed anxiety and depressed mood: Secondary | ICD-10-CM | POA: Diagnosis not present

## 2021-05-11 DIAGNOSIS — F332 Major depressive disorder, recurrent severe without psychotic features: Secondary | ICD-10-CM | POA: Diagnosis not present

## 2021-05-11 DIAGNOSIS — F411 Generalized anxiety disorder: Secondary | ICD-10-CM | POA: Diagnosis not present

## 2021-05-11 DIAGNOSIS — F1120 Opioid dependence, uncomplicated: Secondary | ICD-10-CM | POA: Diagnosis not present

## 2021-05-24 ENCOUNTER — Ambulatory Visit: Payer: Medicare HMO | Admitting: Orthopaedic Surgery

## 2021-05-24 ENCOUNTER — Ambulatory Visit (INDEPENDENT_AMBULATORY_CARE_PROVIDER_SITE_OTHER): Payer: Medicare HMO

## 2021-05-24 ENCOUNTER — Encounter: Payer: Self-pay | Admitting: Orthopaedic Surgery

## 2021-05-24 DIAGNOSIS — G8929 Other chronic pain: Secondary | ICD-10-CM

## 2021-05-24 DIAGNOSIS — M25561 Pain in right knee: Secondary | ICD-10-CM | POA: Diagnosis not present

## 2021-05-24 DIAGNOSIS — M1712 Unilateral primary osteoarthritis, left knee: Secondary | ICD-10-CM

## 2021-05-24 NOTE — Progress Notes (Signed)
? ?Office Visit Note ?  ?Patient: Molly Castaneda           ?Date of Birth: 1960/09/30           ?MRN: [id number] ?Visit Date: 05/24/2021 ?             ?Requested by: [name], FNP ?[address] ?[place],  [place] [address] ?PCP: [name], FNP ? ? ?Assessment & Plan: ?Visit Diagnoses:  ?1. Chronic pain of right knee   ?2. Unilateral primary osteoarthritis, left knee   ? ? ?Plan: Ms. Zendejas is being followed for the chronic problem with her right knee ?Marland Kitchen  She has had a CT scan performed in September 2022 revealing no acute osseous abnormalities and mild tricompartmental osteoarthritis.  She does have a spinal stimulator for chronic back pain and could not have an MRI scan.  She is also on a chronic pain clinic and taking morphine for her pain.  She had a cortisone injection recently in her knee and she notes it helped for short time only to have it recur.  Repeat films today reveal a little bit more narrowing of the medial joint space with some mild varus.  I believe that all of her problem is related to the arthritis.  The issue is how best to help her.  I think her chronic pain is an issue.  She is tried some therapy in the past.  She is tried a Hospital doctor support that tends to "run down my leg".  I certainly do not think she is a candidate for knee replacement as the arthritic changes are really quite mild but I do think it is worth a try viscosupplementation.  We will plan to see her back in 2 weeks and initiate the injections assuming she has been approved by [employer]. ? ? ? ?This patient is diagnosed with osteoarthritis of the knee(s).   ? ?Radiographs show evidence of joint space narrowing, osteophytes, subchondral sclerosis and/or subchondral cysts.  This patient has knee pain which interferes with functional and activities of daily living.   ? ?This patient has experienced inadequate response, adverse effects and/or intolerance with conservative treatments such as acetaminophen,  NSAIDS, topical creams, physical therapy or regular exercise, knee bracing and/or weight loss.  ? ?This patient has experienced inadequate response or has a contraindication to intra articular steroid injections for at least 3 months.  ? ?This patient is not scheduled to have a total knee replacement within 6 months of starting treatment with viscosupplementation. ? ? ?Follow-Up Instructions: Return in about 2 weeks (around 06/07/2021).  ? ?Orders:  ?Orders Placed This Encounter  ?Procedures  ? XR KNEE 3 VIEW RIGHT  ? ?No orders of the defined types were placed in this encounter. ? ? ? ? Procedures: ?No procedures performed ? ? ?Clinical Data: ?No additional findings. ? ? ?Subjective: ?Chief Complaint  ?Patient presents with  ? Right Knee - Follow-up  ?Patient presents today for a three week follow up on her right knee pain. She received a cortisone injection at her last visit. She said that it helped for about one week and then the pain and swelling returned. She is currently in pain management and takes Morphine for pain control. ? ?HPI ? ?Review of Systems ? ? ?Objective: ?Vital Signs: There were no vitals taken for this visit. ? ?Physical Exam ?Constitutional:   ?   Appearance: She is well-developed.  ?Eyes:  ?   Pupils: Pupils are  equal, round, and reactive to light.  ?Pulmonary:  ?   Effort: Pulmonary effort is normal.  ?Skin: ?   General: Skin is warm and dry.  ?Neurological:  ?   Mental Status: She is alert and oriented to person, place, and time.  ?Psychiatric:     ?   Behavior: Behavior normal.  ? ? ?Ortho Exam awake alert and oriented x3.  Comfortable sitting.  No use of ambulatory aid.  Most of the pain in her right knee is localized along the patella with flexion extension associated with some crepitation.  She also has tenderness diffusely along the medial compartment.  Large legs and thighs.  I thought she still has some weakness of her quads and hamstrings.  Full extension flex to 100 degrees without  instability.  No popliteal pain or mass and no pain with range of motion of either hip ? ?Specialty Comments:  ?No specialty comments available. ? ?Imaging: ?XR KNEE 3 VIEW RIGHT ? ?Result Date: 05/24/2021 ?Films of the right knee were obtained in 3 projections standing.  There is very minimal varus but there is some narrowing of the medial joint space associated with subchondral sclerosis in the medial tibial plateau.  The joint surface of the femur and tibia appear to be nicely contoured.  No ectopic calcification.  No peripheral osteophytes medially.  There are some osteophytes at the patellofemoral joint but the patella appears to track in the midline.  Acute changes.  ? ? ?PMFS History: ?Patient Active Problem List  ? Diagnosis Date Noted  ? Unilateral primary osteoarthritis, left knee 05/24/2021  ? Pain in right leg 07/19/2020  ? Pain in right knee 07/19/2020  ? Chronic constipation   ? Hyperlipemia 10/14/2018  ? Adhesive capsulitis of left shoulder 05/14/2018  ? Chronic left shoulder pain 09/18/2017  ? Cervicalgia 09/18/2017  ? Overweight (BMI 25.0-29.9) 05/20/2017  ? Peripheral edema 09/07/2015  ? Vitamin D deficiency 06/22/2014  ? Depression 06/21/2014  ? GAD (generalized anxiety disorder) 06/21/2014  ? Back pain 06/21/2014  ? Left ankle pain 10/16/2013  ? Osteopenia 02/11/2013  ? Left sided sciatica 01/11/2012  ? Constipation 05/08/2010  ? ?Past Medical History:  ?Diagnosis Date  ? Anxiety   ? Arthritis   ? Chronic back pain   ? Chronic constipation   ? Depression   ? MVP (mitral valve prolapse)   ? had ablation 1998  ? Numbness in left leg   ? s/p spinal surgery  ? Supraventricular tachycardia (HCC)   ? ablation 1998  ? Vitamin D deficiency   ?  ?Family History  ?Problem Relation Age of Onset  ? Coronary artery disease Father   ? Colon polyps Father   ? Heart disease Father   ? Prostate cancer Maternal Grandfather   ? Hodgkin's lymphoma Son   ? Liver cancer Paternal Uncle   ?     ?  ? Colon polyps Paternal  Uncle   ? Arthritis Mother   ? Colon cancer Neg Hx   ? Esophageal cancer Neg Hx   ? Rectal cancer Neg Hx   ? Stomach cancer Neg Hx   ?  ?Past Surgical History:  ?Procedure Laterality Date  ? ABDOMINAL HYSTERECTOMY    ? AV NODE ABLATION  1998  ? for svt  ? BACK SURGERY  2001  ? lumbar fusion  ? COLONOSCOPY  2010  ? PARTIAL HYSTERECTOMY  1993  ? REPAIR EXTENSOR TENDON Left 03/06/2013  ? Procedure: LEFT LONG BOUTONNIERE REPAIR;  Surgeon: Wyn Forster., MD;  Location: [facility];  Service: Orthopedics;  Laterality: Left;  ? SPINE SURGERY    ? stimulation system implant  07/31/2000  ? lumbar nerve stimulator-none funtioning now  ? ?Social History  ? ?Occupational History  ? Occupation: disabled  ?  Employer: UNEMPLOYED  ?Tobacco Use  ? Smoking status: Never  ? Smokeless tobacco: Never  ?Vaping Use  ? Vaping Use: Never used  ?Substance and Sexual Activity  ? Alcohol use: No  ? Drug use: No  ? Sexual activity: Yes  ?  Birth control/protection: None  ? ? ? ? ? ? ?

## 2021-06-07 ENCOUNTER — Ambulatory Visit: Payer: Medicare HMO | Admitting: Orthopaedic Surgery

## 2021-06-08 DIAGNOSIS — F411 Generalized anxiety disorder: Secondary | ICD-10-CM | POA: Diagnosis not present

## 2021-06-08 DIAGNOSIS — F332 Major depressive disorder, recurrent severe without psychotic features: Secondary | ICD-10-CM | POA: Diagnosis not present

## 2021-06-08 DIAGNOSIS — F4323 Adjustment disorder with mixed anxiety and depressed mood: Secondary | ICD-10-CM | POA: Diagnosis not present

## 2021-06-08 DIAGNOSIS — F1120 Opioid dependence, uncomplicated: Secondary | ICD-10-CM | POA: Diagnosis not present

## 2021-06-22 ENCOUNTER — Ambulatory Visit (INDEPENDENT_AMBULATORY_CARE_PROVIDER_SITE_OTHER): Payer: Medicare HMO | Admitting: Family Medicine

## 2021-06-22 ENCOUNTER — Encounter: Payer: Self-pay | Admitting: Family Medicine

## 2021-06-22 VITALS — BP 111/66 | HR 92 | Temp 98.3°F | Ht 72.0 in | Wt 198.5 lb

## 2021-06-22 DIAGNOSIS — F332 Major depressive disorder, recurrent severe without psychotic features: Secondary | ICD-10-CM | POA: Diagnosis not present

## 2021-06-22 DIAGNOSIS — F1120 Opioid dependence, uncomplicated: Secondary | ICD-10-CM | POA: Diagnosis not present

## 2021-06-22 DIAGNOSIS — J301 Allergic rhinitis due to pollen: Secondary | ICD-10-CM | POA: Diagnosis not present

## 2021-06-22 DIAGNOSIS — F411 Generalized anxiety disorder: Secondary | ICD-10-CM

## 2021-06-22 DIAGNOSIS — F32A Depression, unspecified: Secondary | ICD-10-CM

## 2021-06-22 DIAGNOSIS — F4323 Adjustment disorder with mixed anxiety and depressed mood: Secondary | ICD-10-CM | POA: Diagnosis not present

## 2021-06-22 MED ORDER — LEVOCETIRIZINE DIHYDROCHLORIDE 5 MG PO TABS: 5.0000 mg | ORAL_TABLET | Freq: Every evening | ORAL | 1 refills | Status: DC

## 2021-06-22 MED ORDER — BENZONATATE 100 MG PO CAPS: 100.0000 mg | ORAL_CAPSULE | Freq: Three times a day (TID) | ORAL | 0 refills | Status: DC | PRN

## 2021-06-22 NOTE — Patient Instructions (Signed)
Allergic Rhinitis, Adult  Allergic rhinitis is an allergic reaction that affects the mucous membrane inside the nose. The mucous membrane is the tissue that produces mucus. There are two types of allergic rhinitis: Seasonal. This type is also called hay fever and happens only during certain seasons. Perennial. This type can happen at any time of the year. Allergic rhinitis cannot be spread from person to person. This condition can be mild, moderate, or severe. It can develop at any age and may be outgrown. What are the causes? This condition is caused by allergens. These are things that can cause an allergic reaction. Allergens may differ for seasonal allergic rhinitis and perennial allergic rhinitis. Seasonal allergic rhinitis is triggered by pollen. Pollen can come from grasses, trees, and weeds. Perennial allergic rhinitis may be triggered by: Dust mites. Proteins in a pet's urine, saliva, or dander. Dander is dead skin cells from a pet. Smoke, mold, or car fumes. What increases the risk? You are more likely to develop this condition if you have a family history of allergies or other conditions related to allergies, including: Allergic conjunctivitis. This is inflammation of parts of the eyes and eyelids. Asthma. This condition affects the lungs and makes it hard to breathe. Atopic dermatitis or eczema. This is long term (chronic) inflammation of the skin. Food allergies. What are the signs or symptoms? Symptoms of this condition include: Sneezing or coughing. A stuffy nose (nasal congestion), itchy nose, or nasal discharge. Itchy eyes and tearing of the eyes. A feeling of mucus dripping down the back of your throat (postnasal drip). Trouble sleeping. Tiredness or fatigue. Headache. Sore throat. How is this diagnosed? This condition may be diagnosed with your symptoms, medical history, and physical exam. Your health care provider may check for related conditions, such  as: Asthma. Pink eye. This is eye inflammation caused by infection (conjunctivitis). Ear infection. Upper respiratory infection. This is an infection in the nose, throat, or upper airways. You may also have tests to find out which allergens trigger your symptoms. These may include skin tests or blood tests. How is this treated? There is no cure for this condition, but treatment can help control symptoms. Treatment may include: Taking medicines that block allergy symptoms, such as corticosteroids and antihistamines. Medicine may be given as a shot, nasal spray, or pill. Avoiding any allergens. Being exposed again and again to tiny amounts of allergens to help you build a defense against allergens (immunotherapy). This is done if other treatments have not helped. It may include: Allergy shots. These are injected medicines that have small amounts of allergen in them. Sublingual immunotherapy. This involves taking small doses of a medicine with allergen in it under your tongue. If these treatments do not work, your health care provider may prescribe newer, stronger medicines. Follow these instructions at home: Avoiding allergens Find out what you are allergic to and avoid those allergens. These are some things you can do to help avoid allergens: If you have perennial allergies: Replace carpet with wood, tile, or vinyl flooring. Carpet can trap dander and dust. Do not smoke. Do not allow smoking in your home. Change your heating and air conditioning filters at least once a month. If you have seasonal allergies, take these steps during allergy season: Keep windows closed as much as possible. Plan outdoor activities when pollen counts are lowest. Check pollen counts before you plan outdoor activities. When coming indoors, change clothing and shower before sitting on furniture or bedding. If you have a pet   in the house that produces allergens: Keep the pet out of the bedroom. Vacuum, sweep, and  dust regularly. General instructions Take over-the-counter and prescription medicines only as told by your health care provider. Drink enough fluid to keep your urine pale yellow. Keep all follow-up visits as told by your health care provider. This is important. Where to find more information American Academy of Allergy, Asthma & Immunology: www.aaaai.org Contact a health care provider if: You have a fever. You develop a cough that does not go away. You make whistling sounds when you breathe (wheeze). Your symptoms slow you down or stop you from doing your normal activities each day. Get help right away if: You have shortness of breath. This symptom may represent a serious problem that is an emergency. Do not wait to see if the symptom will go away. Get medical help right away. Call your local emergency services (911 in the [place] Do not drive yourself to the hospital. Summary Allergic rhinitis may be managed by taking medicines as directed and avoiding allergens. If you have seasonal allergies, keep windows closed as much as possible during allergy season. Contact your health care provider if you develop a fever or a cough that does not go away. This information is not intended to replace advice given to you by your health care provider. Make sure you discuss any questions you have with your health care provider. Document Revised: 03/13/2019 Document Reviewed: 01/20/2019 Elsevier Patient Education  2023 Elsevier Inc.  

## 2021-06-22 NOTE — Progress Notes (Signed)
Acute Office Visit  Subjective:     Patient ID: Molly Castaneda, female    DOB: 1960-11-07, 61 y.o.   MRN: [id number]  Chief Complaint  Patient presents with   Cough    HPI Patient is in today for a dry cough x 2 days. She also reports a runny nose and itchy throat. She did have a headache yesterday but not today. Her symptoms started after working in her garden. She has not been taking flonase lately. She did take some OTC cold and flu medication with some improvement. She denies fever, chills, body aches, dyspnea, wheezing, chest pain, or ear pain.    She has a history of anxiety and depression. Denies SI, plan, or intent. She is taking medication as prescribed. She is overdue for a follow up with her PCP.   ROS As per HPI.      Objective:    BP 111/66   Pulse 92   Temp 98.3 F (36.8 C) (Temporal)   Ht 6' (1.829 m)   Wt 198 lb 8 oz (90 kg)   SpO2 100%   BMI 26.92 kg/m  BP Readings from Last 3 Encounters:  06/22/21 111/66  02/02/21 118/81  11/11/20 122/72      Physical Exam Vitals and nursing note reviewed.  Constitutional:      General: She is not in acute distress.    Appearance: She is not ill-appearing, toxic-appearing or diaphoretic.  HENT:     Head: Normocephalic and atraumatic.     Right Ear: Tympanic membrane, ear canal and external ear normal.     Left Ear: Tympanic membrane, ear canal and external ear normal.     Nose: Nose normal.     Right Sinus: No maxillary sinus tenderness or frontal sinus tenderness.     Left Sinus: No maxillary sinus tenderness or frontal sinus tenderness.     Mouth/Throat:     Mouth: Mucous membranes are moist.     Tongue: No lesions.     Pharynx: Oropharynx is clear. No pharyngeal swelling, oropharyngeal exudate or posterior oropharyngeal erythema.     Tonsils: No tonsillar exudate or tonsillar abscesses. 0 on the right. 0 on the left.  Eyes:     General: No scleral icterus.       Right eye: No discharge.        Left  eye: No discharge.     Conjunctiva/sclera: Conjunctivae normal.  Cardiovascular:     Rate and Rhythm: Normal rate and regular rhythm.     Heart sounds: Normal heart sounds. No murmur heard. Pulmonary:     Effort: Pulmonary effort is normal. No respiratory distress.     Breath sounds: Normal breath sounds.  Skin:    General: Skin is warm and dry.  Neurological:     Mental Status: She is alert and oriented to person, place, and time.  Psychiatric:        Mood and Affect: Mood normal.    No results found for any visits on 06/22/21.      Assessment & Plan:   Lorenda was seen today for cough.  Diagnoses and all orders for this visit:  Seasonal allergic rhinitis due to pollen Xyzal daily. Can also take flonase. Tessalon perles prn. Return to office for new or worsening symptoms, or if symptoms persist.  -     levocetirizine (XYZAL) 5 MG tablet; Take 1 tablet (5 mg total) by mouth every evening. -     benzonatate (TESSALON PERLES)  100 MG capsule; Take 1 capsule (100 mg total) by mouth 3 (three) times daily as needed for cough.  GAD (generalized anxiety disorder) Depression, unspecified depression type Uncontrolled. Denies SI, plan, or intent. Continue medication as prescribed. Follow up scheduled with PCP to discuss.   Return for schedule chronic follow up with PCP.  The patient indicates understanding of these issues and agrees with the plan.  Gwenlyn Perking, FNP

## 2021-06-29 ENCOUNTER — Telehealth: Payer: Self-pay | Admitting: Family

## 2021-06-29 MED ORDER — FLUCONAZOLE 150 MG PO TABS: 150.0000 mg | ORAL_TABLET | ORAL | 0 refills | Status: DC | PRN

## 2021-06-29 MED ORDER — AMOXICILLIN-POT CLAVULANATE 875-125 MG PO TABS: 1.0000 | ORAL_TABLET | Freq: Two times a day (BID) | ORAL | 0 refills | Status: DC

## 2021-06-29 NOTE — Telephone Encounter (Signed)
Diflucan Prescription sent to pharmacy   

## 2021-06-29 NOTE — Telephone Encounter (Signed)
Augmentin Prescription sent to pharmacy.  Kelsei Defino, FNP   

## 2021-06-29 NOTE — Telephone Encounter (Signed)
  Incoming Patient Call  06/29/2021  What symptoms do you have? Still blowing nose and cough  How long have you been sick? 2 weeks  Have you been seen for this problem? 5-18 with Tiffany  If your provider decides to give you a prescription, which pharmacy would you like for it to be sent to? [facility] in [place]   Patient informed that this information will be sent to the clinical staff for review and that they should receive a follow up call.

## 2021-06-29 NOTE — Telephone Encounter (Signed)
Needs appt

## 2021-06-29 NOTE — Telephone Encounter (Signed)
Patient informed. 

## 2021-07-07 DIAGNOSIS — K5903 Drug induced constipation: Secondary | ICD-10-CM | POA: Diagnosis not present

## 2021-07-07 DIAGNOSIS — M961 Postlaminectomy syndrome, not elsewhere classified: Secondary | ICD-10-CM | POA: Diagnosis not present

## 2021-07-07 DIAGNOSIS — T402X5A Adverse effect of other opioids, initial encounter: Secondary | ICD-10-CM | POA: Diagnosis not present

## 2021-07-07 DIAGNOSIS — F1190 Opioid use, unspecified, uncomplicated: Secondary | ICD-10-CM | POA: Diagnosis not present

## 2021-07-07 DIAGNOSIS — G894 Chronic pain syndrome: Secondary | ICD-10-CM | POA: Diagnosis not present

## 2021-07-14 ENCOUNTER — Encounter: Payer: Self-pay | Admitting: Family

## 2021-07-14 ENCOUNTER — Ambulatory Visit (INDEPENDENT_AMBULATORY_CARE_PROVIDER_SITE_OTHER): Payer: Medicare HMO | Admitting: Family

## 2021-07-14 VITALS — BP 110/70 | HR 76 | Temp 97.4°F | Ht 72.0 in | Wt 200.2 lb

## 2021-07-14 DIAGNOSIS — G8929 Other chronic pain: Secondary | ICD-10-CM

## 2021-07-14 DIAGNOSIS — M1712 Unilateral primary osteoarthritis, left knee: Secondary | ICD-10-CM

## 2021-07-14 DIAGNOSIS — F331 Major depressive disorder, recurrent, moderate: Secondary | ICD-10-CM

## 2021-07-14 DIAGNOSIS — Z0000 Encounter for general adult medical examination without abnormal findings: Secondary | ICD-10-CM

## 2021-07-14 DIAGNOSIS — E785 Hyperlipidemia, unspecified: Secondary | ICD-10-CM

## 2021-07-14 DIAGNOSIS — B351 Tinea unguium: Secondary | ICD-10-CM

## 2021-07-14 DIAGNOSIS — E663 Overweight: Secondary | ICD-10-CM | POA: Diagnosis not present

## 2021-07-14 DIAGNOSIS — Z23 Encounter for immunization: Secondary | ICD-10-CM | POA: Diagnosis not present

## 2021-07-14 DIAGNOSIS — F411 Generalized anxiety disorder: Secondary | ICD-10-CM | POA: Diagnosis not present

## 2021-07-14 DIAGNOSIS — Z0001 Encounter for general adult medical examination with abnormal findings: Secondary | ICD-10-CM

## 2021-07-14 DIAGNOSIS — K5903 Drug induced constipation: Secondary | ICD-10-CM

## 2021-07-14 DIAGNOSIS — M25512 Pain in left shoulder: Secondary | ICD-10-CM

## 2021-07-14 DIAGNOSIS — M5442 Lumbago with sciatica, left side: Secondary | ICD-10-CM | POA: Diagnosis not present

## 2021-07-14 DIAGNOSIS — M5432 Sciatica, left side: Secondary | ICD-10-CM

## 2021-07-14 DIAGNOSIS — E559 Vitamin D deficiency, unspecified: Secondary | ICD-10-CM | POA: Diagnosis not present

## 2021-07-14 DIAGNOSIS — M542 Cervicalgia: Secondary | ICD-10-CM

## 2021-07-14 MED ORDER — TRIAMCINOLONE ACETONIDE 0.5 % EX OINT: 1.0000 "application " | TOPICAL_OINTMENT | Freq: Two times a day (BID) | CUTANEOUS | 0 refills | Status: DC
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
## 2021-07-14 NOTE — Patient Instructions (Signed)

## 2021-07-15 LAB — CMP14+EGFR
ALT: 21 IU/L (ref 0–32)
AST: 14 IU/L (ref 0–40)
Albumin/Globulin Ratio: 1.6 (ref 1.2–2.2)
Albumin: 3.9 g/dL (ref 3.8–4.8)
Alkaline Phosphatase: 88 IU/L (ref 44–121)
BUN/Creatinine Ratio: 14 (ref 12–28)
BUN: 12 mg/dL (ref 8–27)
Bilirubin Total: 0.5 mg/dL (ref 0.0–1.2)
CO2: 21 mmol/L (ref 20–29)
Calcium: 8.8 mg/dL (ref 8.7–10.3)
Chloride: 109 mmol/L — ABNORMAL HIGH (ref 96–106)
Creatinine, Ser: 0.85 mg/dL (ref 0.57–1.00)
Globulin, Total: 2.5 g/dL (ref 1.5–4.5)
Glucose: 83 mg/dL (ref 70–99)
Potassium: 4.3 mmol/L (ref 3.5–5.2)
Sodium: 142 mmol/L (ref 134–144)
Total Protein: 6.4 g/dL (ref 6.0–8.5)
eGFR: 77 mL/min/{1.73_m2} (ref >59)

## 2021-07-15 LAB — CBC WITH DIFFERENTIAL/PLATELET
Basophils Absolute: 0 10*3/uL (ref 0.0–0.2)
Basos: 1 %
EOS (ABSOLUTE): 0.3 10*3/uL (ref 0.0–0.4)
Eos: 5 %
Hematocrit: 40 % (ref 34.0–46.6)
Hemoglobin: 12.6 g/dL (ref 11.1–15.9)
Immature Grans (Abs): 0 10*3/uL (ref 0.0–0.1)
Immature Granulocytes: 0 %
Lymphocytes Absolute: 1.6 10*3/uL (ref 0.7–3.1)
Lymphs: 33 %
MCH: 26.8 pg (ref 26.6–33.0)
MCHC: 31.5 g/dL (ref 31.5–35.7)
MCV: 85 fL (ref 79–97)
Monocytes Absolute: 0.5 10*3/uL (ref 0.1–0.9)
Monocytes: 10 %
Neutrophils Absolute: 2.5 10*3/uL (ref 1.4–7.0)
Neutrophils: 51 %
Platelets: 321 10*3/uL (ref 150–450)
RBC: 4.7 x10E6/uL (ref 3.77–5.28)
RDW: 13 % (ref 11.7–15.4)
WBC: 4.8 10*3/uL (ref 3.4–10.8)

## 2021-07-15 LAB — LIPID PANEL
Chol/HDL Ratio: 2.9 ratio (ref 0.0–4.4)
Cholesterol, Total: 184 mg/dL (ref 100–199)
HDL: 63 mg/dL (ref >39)
LDL Chol Calc (NIH): 107 mg/dL — ABNORMAL HIGH (ref 0–99)
Triglycerides: 76 mg/dL (ref 0–149)
VLDL Cholesterol Cal: 14 mg/dL (ref 5–40)

## 2021-07-15 LAB — TSH: TSH: 1.91 u[IU]/mL (ref 0.450–4.500)

## 2021-08-10 ENCOUNTER — Other Ambulatory Visit: Payer: Self-pay | Admitting: Family

## 2021-08-10 DIAGNOSIS — R609 Edema, unspecified: Secondary | ICD-10-CM

## 2021-08-10 DIAGNOSIS — R600 Localized edema: Secondary | ICD-10-CM

## 2021-08-21 ENCOUNTER — Other Ambulatory Visit: Payer: Self-pay | Admitting: Family Medicine

## 2021-08-21 DIAGNOSIS — J301 Allergic rhinitis due to pollen: Secondary | ICD-10-CM

## 2021-08-22 ENCOUNTER — Telehealth: Payer: Self-pay | Admitting: Family

## 2021-08-24 NOTE — Telephone Encounter (Signed)
ok 

## 2021-09-04 DIAGNOSIS — F4323 Adjustment disorder with mixed anxiety and depressed mood: Secondary | ICD-10-CM | POA: Diagnosis not present

## 2021-09-04 DIAGNOSIS — F1120 Opioid dependence, uncomplicated: Secondary | ICD-10-CM | POA: Diagnosis not present

## 2021-09-04 DIAGNOSIS — F332 Major depressive disorder, recurrent severe without psychotic features: Secondary | ICD-10-CM | POA: Diagnosis not present

## 2021-10-04 DIAGNOSIS — F332 Major depressive disorder, recurrent severe without psychotic features: Secondary | ICD-10-CM | POA: Diagnosis not present

## 2021-10-04 DIAGNOSIS — G894 Chronic pain syndrome: Secondary | ICD-10-CM | POA: Diagnosis not present

## 2021-10-04 DIAGNOSIS — F4323 Adjustment disorder with mixed anxiety and depressed mood: Secondary | ICD-10-CM | POA: Diagnosis not present

## 2021-10-13 ENCOUNTER — Encounter: Payer: Self-pay | Admitting: Nurse Practitioner

## 2021-10-13 ENCOUNTER — Ambulatory Visit (INDEPENDENT_AMBULATORY_CARE_PROVIDER_SITE_OTHER): Payer: Medicare HMO | Admitting: Nurse Practitioner

## 2021-10-13 VITALS — BP 126/74 | HR 86 | Temp 97.8°F | Ht 73.0 in | Wt 199.6 lb

## 2021-10-13 DIAGNOSIS — J40 Bronchitis, not specified as acute or chronic: Secondary | ICD-10-CM | POA: Diagnosis not present

## 2021-10-13 MED ORDER — BENZONATATE 100 MG PO CAPS: 100.0000 mg | ORAL_CAPSULE | Freq: Three times a day (TID) | ORAL | 0 refills | Status: DC | PRN

## 2021-10-13 MED ORDER — AZITHROMYCIN 250 MG PO TABS: ORAL_TABLET | ORAL | 0 refills | Status: DC

## 2021-10-13 MED ORDER — PREDNISONE 20 MG PO TABS: 40.0000 mg | ORAL_TABLET | Freq: Every day | ORAL | 0 refills | Status: AC

## 2021-10-13 NOTE — Progress Notes (Signed)
Subjective:    Patient ID: Molly Castaneda, female    DOB: 02-18-1960, 61 y.o.   MRN: [id number]   Chief Complaint: Cough and facial pressure (X 1 day after raking leaves the last 2 days)   Cough This is a new problem. The current episode started in the past 7 days. The problem has been gradually worsening. The problem occurs constantly. The cough is Productive of sputum. Associated symptoms include ear congestion and rhinorrhea. Pertinent negatives include no chills, ear pain, fever, headaches, heartburn, sore throat or shortness of breath. Nothing aggravates the symptoms. She has tried OTC cough suppressant for the symptoms. The treatment provided mild relief.       Review of Systems  Constitutional:  Negative for chills and fever.  HENT:  Positive for rhinorrhea. Negative for ear pain and sore throat.   Respiratory:  Positive for cough. Negative for shortness of breath.   Gastrointestinal:  Negative for heartburn.  Neurological:  Negative for dizziness and headaches.       Objective:   Physical Exam Vitals and nursing note reviewed.  Constitutional:      General: She is not in acute distress.    Appearance: Normal appearance. She is well-developed.  HENT:     Head: Normocephalic.     Right Ear: Tympanic membrane normal.     Left Ear: Tympanic membrane normal.     Nose: Congestion present.     Mouth/Throat:     Mouth: Mucous membranes are moist.  Eyes:     Pupils: Pupils are equal, round, and reactive to light.  Neck:     Vascular: No carotid bruit or JVD.  Cardiovascular:     Rate and Rhythm: Normal rate and regular rhythm.     Heart sounds: Normal heart sounds.  Pulmonary:     Effort: Pulmonary effort is normal. No respiratory distress.     Breath sounds: Normal breath sounds. No wheezing or rales.     Comments: deep tight cough Chest:     Chest wall: No tenderness.  Abdominal:     General: Bowel sounds are normal. There is no distension or abdominal bruit.      Palpations: Abdomen is soft. There is no hepatomegaly, splenomegaly, mass or pulsatile mass.     Tenderness: There is no abdominal tenderness.  Musculoskeletal:        General: Normal range of motion.     Cervical back: Normal range of motion and neck supple.  Lymphadenopathy:     Cervical: No cervical adenopathy.  Skin:    General: Skin is warm and dry.  Neurological:     Mental Status: She is alert and oriented to person, place, and time.     Deep Tendon Reflexes: Reflexes are normal and symmetric.  Psychiatric:        Behavior: Behavior normal.        Thought Content: Thought content normal.        Judgment: Judgment normal.     BP 126/74   Pulse 86   Temp 97.8 F (36.6 C) (Temporal)   Ht 6\' 1"  (1.854 m)   Wt 199 lb 9.6 oz (90.5 kg)   SpO2 98%   BMI 26.33 kg/m        Assessment & Plan:   in today with chief complaint of Cough and facial pressure (X 1 day after raking leaves the last 2 days)   1. Bronchitis 1. Take meds as prescribed 2. Use a cool  mist humidifier especially during the winter months and when heat has been humid. 3. Use saline nose sprays frequently 4. Saline irrigations of the nose can be very helpful if done frequently.  * 4X daily for 1 week*  * Use of a nettie pot can be helpful with this. Follow directions with this* 5. Drink plenty of fluids 6. Keep thermostat turn down low 7.For any cough or congestion- tessalon perles 8. For fever or aces or pains- take tylenol or ibuprofen appropriate for age and weight.  * for fevers greater than 101 orally you may alternate ibuprofen and tylenol every  3 hours.   Meds ordered this encounter  Medications   azithromycin (ZITHROMAX Z-PAK) 250 MG tablet    Sig: As directed    Dispense:  6 tablet    Refill:  0    Order Specific Question:   Supervising Provider    Answer:   Arville Care A [[id number]]   benzonatate (TESSALON PERLES) 100 MG capsule    Sig: Take 1 capsule (100 mg total) by  mouth 3 (three) times daily as needed for cough.    Dispense:  20 capsule    Refill:  0    Order Specific Question:   Supervising Provider    Answer:   Arville Care A [[id number]]   predniSONE (DELTASONE) 20 MG tablet    Sig: Take 2 tablets (40 mg total) by mouth daily with breakfast for 5 days. 2 po daily for 5 days    Dispense:  10 tablet    Refill:  0    Order Specific Question:   Supervising Provider    Answer:   Arville Care A [[id number]]       The above assessment and management plan was discussed with the patient. The patient verbalized understanding of and has agreed to the management plan. Patient is aware to call the clinic if symptoms persist or worsen. Patient is aware when to return to the clinic for a follow-up visit. Patient educated on when it is appropriate to go to the emergency department.   Mary-Margaret Daphine Deutscher, FNP

## 2021-10-13 NOTE — Progress Notes (Deleted)
6j

## 2021-10-13 NOTE — Patient Instructions (Signed)
  Acute Bronchitis, Adult  Acute bronchitis is when air tubes in the lungs (bronchi) suddenly get swollen. The condition can make it hard for you to breathe. In adults, acute bronchitis usually goes away within 2 weeks. A cough caused by bronchitis may last up to 3 weeks. Smoking, allergies, and asthma can make the condition worse. What are the causes? Germs that cause cold and flu (viruses). The most common cause of this condition is the virus that causes the common cold. Bacteria. Substances that bother (irritate) the lungs, including: Smoke from cigarettes and other types of tobacco. Dust and pollen. Fumes from chemicals, gases, or burned fuel. Indoor or outdoor air pollution. What increases the risk? A weak body's defense system. This is also called the immune system. Any condition that affects your lungs and breathing, such as asthma. What are the signs or symptoms? A cough. Coughing up clear, yellow, or green mucus. Making high-pitched whistling sounds when you breathe, most often when you breathe out (wheezing). Runny or stuffy nose. Having too much mucus in your lungs (chest congestion). Shortness of breath. Body aches. A sore throat. How is this treated? Acute bronchitis may go away over time without treatment. Your doctor may tell you to: Drink more fluids. This will help thin your mucus so it is easier to cough up. Use a device that gets medicine into your lungs (inhaler). Use a vaporizer or a humidifier. These are machines that add water to the air. This helps with coughing and poor breathing. Take a medicine that thins mucus and helps clear it from your lungs. Take a medicine that prevents or stops coughing. It is not common to take an antibiotic medicine for this condition. Follow these instructions at home:  Take over-the-counter and prescription medicines only as told by your doctor. Use an inhaler, vaporizer, or humidifier as told by your doctor. Take two  teaspoons (10 mL) of honey at bedtime. This helps lessen your coughing at night. Drink enough fluid to keep your pee (urine) pale yellow. Do not smoke or use any products that contain nicotine or tobacco. If you need help quitting, ask your doctor. Get a lot of rest. Return to your normal activities when your doctor says that it is safe. Keep all follow-up visits. How is this prevented?  Wash your hands often with soap and water for at least 20 seconds. If you cannot use soap and water, use hand sanitizer. Avoid contact with people who have cold symptoms. Try not to touch your mouth, nose, or eyes with your hands. Avoid breathing in smoke or chemical fumes. Make sure to get the flu shot every year. Contact a doctor if: Your symptoms do not get better in 2 weeks. You have trouble coughing up the mucus. Your cough keeps you awake at night. You have a fever. Get help right away if: You cough up blood. You have chest pain. You have very bad shortness of breath. You faint or keep feeling like you are going to faint. You have a very bad headache. Your fever or chills get worse. These symptoms may be an emergency. Get help right away. Call your local emergency services (911 in the [place] Do not wait to see if the symptoms will go away. Do not drive yourself to the hospital. Summary Acute bronchitis is when air tubes in the lungs (bronchi) suddenly get swollen. In adults, acute bronchitis usually goes away within 2 weeks. Drink more fluids. This will help thin your mucus so it   is easier to cough up. Take over-the-counter and prescription medicines only as told by your doctor. Contact a doctor if your symptoms do not improve after 2 weeks of treatment. This information is not intended to replace advice given to you by your health care provider. Make sure you discuss any questions you have with your health care provider. Document Revised: 05/25/2020 Document Reviewed: 05/25/2020 Elsevier  Patient Education  2023 [employer]  

## 2021-10-17 DIAGNOSIS — M25562 Pain in left knee: Secondary | ICD-10-CM | POA: Diagnosis not present

## 2021-10-17 DIAGNOSIS — G894 Chronic pain syndrome: Secondary | ICD-10-CM | POA: Diagnosis not present

## 2021-10-17 DIAGNOSIS — Z79891 Long term (current) use of opiate analgesic: Secondary | ICD-10-CM | POA: Diagnosis not present

## 2021-10-17 DIAGNOSIS — Z5181 Encounter for therapeutic drug level monitoring: Secondary | ICD-10-CM | POA: Diagnosis not present

## 2021-10-17 DIAGNOSIS — G8929 Other chronic pain: Secondary | ICD-10-CM | POA: Diagnosis not present

## 2021-10-17 DIAGNOSIS — M961 Postlaminectomy syndrome, not elsewhere classified: Secondary | ICD-10-CM | POA: Diagnosis not present

## 2021-10-17 DIAGNOSIS — F1190 Opioid use, unspecified, uncomplicated: Secondary | ICD-10-CM | POA: Diagnosis not present

## 2021-10-24 DIAGNOSIS — M79676 Pain in unspecified toe(s): Secondary | ICD-10-CM | POA: Diagnosis not present

## 2021-10-24 DIAGNOSIS — I70203 Unspecified atherosclerosis of native arteries of extremities, bilateral legs: Secondary | ICD-10-CM | POA: Diagnosis not present

## 2021-10-24 DIAGNOSIS — B351 Tinea unguium: Secondary | ICD-10-CM | POA: Diagnosis not present

## 2021-10-24 DIAGNOSIS — L84 Corns and callosities: Secondary | ICD-10-CM | POA: Diagnosis not present

## 2021-11-01 DIAGNOSIS — F4323 Adjustment disorder with mixed anxiety and depressed mood: Secondary | ICD-10-CM | POA: Diagnosis not present

## 2021-11-01 DIAGNOSIS — F332 Major depressive disorder, recurrent severe without psychotic features: Secondary | ICD-10-CM | POA: Diagnosis not present

## 2021-11-01 DIAGNOSIS — F1120 Opioid dependence, uncomplicated: Secondary | ICD-10-CM | POA: Diagnosis not present

## 2021-11-01 DIAGNOSIS — F411 Generalized anxiety disorder: Secondary | ICD-10-CM | POA: Diagnosis not present

## 2021-11-14 ENCOUNTER — Other Ambulatory Visit: Payer: Self-pay | Admitting: Family

## 2021-11-14 DIAGNOSIS — J301 Allergic rhinitis due to pollen: Secondary | ICD-10-CM

## 2021-11-16 ENCOUNTER — Other Ambulatory Visit: Payer: Self-pay | Admitting: Family

## 2021-11-16 DIAGNOSIS — R609 Edema, unspecified: Secondary | ICD-10-CM

## 2021-11-17 DIAGNOSIS — M25562 Pain in left knee: Secondary | ICD-10-CM | POA: Diagnosis not present

## 2021-11-17 DIAGNOSIS — G8929 Other chronic pain: Secondary | ICD-10-CM | POA: Diagnosis not present

## 2021-11-17 DIAGNOSIS — M25561 Pain in right knee: Secondary | ICD-10-CM | POA: Diagnosis not present

## 2021-11-23 ENCOUNTER — Other Ambulatory Visit: Payer: Self-pay | Admitting: Family

## 2021-11-23 DIAGNOSIS — Z1231 Encounter for screening mammogram for malignant neoplasm of breast: Secondary | ICD-10-CM

## 2021-11-30 DIAGNOSIS — F332 Major depressive disorder, recurrent severe without psychotic features: Secondary | ICD-10-CM | POA: Diagnosis not present

## 2021-11-30 DIAGNOSIS — F411 Generalized anxiety disorder: Secondary | ICD-10-CM | POA: Diagnosis not present

## 2021-11-30 DIAGNOSIS — F1120 Opioid dependence, uncomplicated: Secondary | ICD-10-CM | POA: Diagnosis not present

## 2021-11-30 DIAGNOSIS — F4323 Adjustment disorder with mixed anxiety and depressed mood: Secondary | ICD-10-CM | POA: Diagnosis not present

## 2022-01-02 DIAGNOSIS — M79676 Pain in unspecified toe(s): Secondary | ICD-10-CM | POA: Diagnosis not present

## 2022-01-02 DIAGNOSIS — B351 Tinea unguium: Secondary | ICD-10-CM | POA: Diagnosis not present

## 2022-01-02 DIAGNOSIS — I70203 Unspecified atherosclerosis of native arteries of extremities, bilateral legs: Secondary | ICD-10-CM | POA: Diagnosis not present

## 2022-01-02 DIAGNOSIS — L84 Corns and callosities: Secondary | ICD-10-CM | POA: Diagnosis not present

## 2022-01-03 DIAGNOSIS — F4323 Adjustment disorder with mixed anxiety and depressed mood: Secondary | ICD-10-CM | POA: Diagnosis not present

## 2022-01-03 DIAGNOSIS — F411 Generalized anxiety disorder: Secondary | ICD-10-CM | POA: Diagnosis not present

## 2022-01-03 DIAGNOSIS — F332 Major depressive disorder, recurrent severe without psychotic features: Secondary | ICD-10-CM | POA: Diagnosis not present

## 2022-01-03 DIAGNOSIS — F1120 Opioid dependence, uncomplicated: Secondary | ICD-10-CM | POA: Diagnosis not present

## 2022-01-15 ENCOUNTER — Ambulatory Visit: Payer: Medicare HMO

## 2022-01-18 DIAGNOSIS — M25561 Pain in right knee: Secondary | ICD-10-CM | POA: Diagnosis not present

## 2022-01-18 DIAGNOSIS — G894 Chronic pain syndrome: Secondary | ICD-10-CM | POA: Diagnosis not present

## 2022-01-18 DIAGNOSIS — Z79891 Long term (current) use of opiate analgesic: Secondary | ICD-10-CM | POA: Diagnosis not present

## 2022-01-18 DIAGNOSIS — G8929 Other chronic pain: Secondary | ICD-10-CM | POA: Diagnosis not present

## 2022-01-18 DIAGNOSIS — F1190 Opioid use, unspecified, uncomplicated: Secondary | ICD-10-CM | POA: Diagnosis not present

## 2022-01-18 DIAGNOSIS — M25562 Pain in left knee: Secondary | ICD-10-CM | POA: Diagnosis not present

## 2022-01-18 DIAGNOSIS — M961 Postlaminectomy syndrome, not elsewhere classified: Secondary | ICD-10-CM | POA: Diagnosis not present

## 2022-01-31 ENCOUNTER — Ambulatory Visit
Admission: RE | Admit: 2022-01-31 | Discharge: 2022-01-31 | Disposition: A | Discharge status: Home / Self Care | Payer: Medicare HMO | Source: Ambulatory Visit | Attending: Family | Admitting: Family

## 2022-01-31 DIAGNOSIS — Z1231 Encounter for screening mammogram for malignant neoplasm of breast: Secondary | ICD-10-CM | POA: Diagnosis not present

## 2022-02-01 DIAGNOSIS — Z01419 Encounter for gynecological examination (general) (routine) without abnormal findings: Secondary | ICD-10-CM | POA: Diagnosis not present

## 2022-02-01 DIAGNOSIS — N951 Menopausal and female climacteric states: Secondary | ICD-10-CM | POA: Diagnosis not present

## 2022-02-01 DIAGNOSIS — Z6830 Body mass index (BMI) 30.0-30.9, adult: Secondary | ICD-10-CM | POA: Diagnosis not present

## 2022-02-21 DIAGNOSIS — F1120 Opioid dependence, uncomplicated: Secondary | ICD-10-CM | POA: Diagnosis not present

## 2022-02-21 DIAGNOSIS — F4323 Adjustment disorder with mixed anxiety and depressed mood: Secondary | ICD-10-CM | POA: Diagnosis not present

## 2022-02-21 DIAGNOSIS — F411 Generalized anxiety disorder: Secondary | ICD-10-CM | POA: Diagnosis not present

## 2022-02-21 DIAGNOSIS — F332 Major depressive disorder, recurrent severe without psychotic features: Secondary | ICD-10-CM | POA: Diagnosis not present

## 2022-03-05 DIAGNOSIS — M79642 Pain in left hand: Secondary | ICD-10-CM | POA: Diagnosis not present

## 2022-03-20 ENCOUNTER — Other Ambulatory Visit: Payer: Self-pay | Admitting: Family

## 2022-03-21 DIAGNOSIS — M19042 Primary osteoarthritis, left hand: Secondary | ICD-10-CM | POA: Diagnosis not present

## 2022-03-21 DIAGNOSIS — Z0189 Encounter for other specified special examinations: Secondary | ICD-10-CM | POA: Diagnosis not present

## 2022-03-30 DIAGNOSIS — M25642 Stiffness of left hand, not elsewhere classified: Secondary | ICD-10-CM | POA: Diagnosis not present

## 2022-03-30 DIAGNOSIS — Z981 Arthrodesis status: Secondary | ICD-10-CM | POA: Diagnosis not present

## 2022-03-30 DIAGNOSIS — Z4889 Encounter for other specified surgical aftercare: Secondary | ICD-10-CM | POA: Diagnosis not present

## 2022-03-30 DIAGNOSIS — M79642 Pain in left hand: Secondary | ICD-10-CM | POA: Diagnosis not present

## 2022-04-04 DIAGNOSIS — F4323 Adjustment disorder with mixed anxiety and depressed mood: Secondary | ICD-10-CM | POA: Diagnosis not present

## 2022-04-04 DIAGNOSIS — F332 Major depressive disorder, recurrent severe without psychotic features: Secondary | ICD-10-CM | POA: Diagnosis not present

## 2022-04-04 DIAGNOSIS — F1120 Opioid dependence, uncomplicated: Secondary | ICD-10-CM | POA: Diagnosis not present

## 2022-04-04 DIAGNOSIS — F411 Generalized anxiety disorder: Secondary | ICD-10-CM | POA: Diagnosis not present

## 2022-04-20 DIAGNOSIS — M961 Postlaminectomy syndrome, not elsewhere classified: Secondary | ICD-10-CM | POA: Diagnosis not present

## 2022-04-20 DIAGNOSIS — F1190 Opioid use, unspecified, uncomplicated: Secondary | ICD-10-CM | POA: Diagnosis not present

## 2022-04-20 DIAGNOSIS — Z79891 Long term (current) use of opiate analgesic: Secondary | ICD-10-CM | POA: Diagnosis not present

## 2022-04-20 DIAGNOSIS — G894 Chronic pain syndrome: Secondary | ICD-10-CM | POA: Diagnosis not present

## 2022-04-20 DIAGNOSIS — M545 Low back pain, unspecified: Secondary | ICD-10-CM | POA: Diagnosis not present

## 2022-04-20 DIAGNOSIS — M25561 Pain in right knee: Secondary | ICD-10-CM | POA: Diagnosis not present

## 2022-04-20 DIAGNOSIS — G8929 Other chronic pain: Secondary | ICD-10-CM | POA: Diagnosis not present

## 2022-05-06 ENCOUNTER — Other Ambulatory Visit: Payer: Self-pay | Admitting: Family

## 2022-05-06 DIAGNOSIS — J301 Allergic rhinitis due to pollen: Secondary | ICD-10-CM

## 2022-05-07 DIAGNOSIS — M79642 Pain in left hand: Secondary | ICD-10-CM | POA: Diagnosis not present

## 2022-05-22 DIAGNOSIS — F411 Generalized anxiety disorder: Secondary | ICD-10-CM | POA: Diagnosis not present

## 2022-05-22 DIAGNOSIS — F332 Major depressive disorder, recurrent severe without psychotic features: Secondary | ICD-10-CM | POA: Diagnosis not present

## 2022-05-22 DIAGNOSIS — F1120 Opioid dependence, uncomplicated: Secondary | ICD-10-CM | POA: Diagnosis not present

## 2022-05-22 DIAGNOSIS — F4323 Adjustment disorder with mixed anxiety and depressed mood: Secondary | ICD-10-CM | POA: Diagnosis not present

## 2022-05-24 DIAGNOSIS — F4323 Adjustment disorder with mixed anxiety and depressed mood: Secondary | ICD-10-CM | POA: Diagnosis not present

## 2022-05-24 DIAGNOSIS — F332 Major depressive disorder, recurrent severe without psychotic features: Secondary | ICD-10-CM | POA: Diagnosis not present

## 2022-05-24 DIAGNOSIS — F411 Generalized anxiety disorder: Secondary | ICD-10-CM | POA: Diagnosis not present

## 2022-05-24 DIAGNOSIS — M79642 Pain in left hand: Secondary | ICD-10-CM | POA: Diagnosis not present

## 2022-05-24 DIAGNOSIS — F1120 Opioid dependence, uncomplicated: Secondary | ICD-10-CM | POA: Diagnosis not present

## 2022-05-25 ENCOUNTER — Telehealth (INDEPENDENT_AMBULATORY_CARE_PROVIDER_SITE_OTHER): Payer: Medicare HMO | Admitting: Family

## 2022-05-25 ENCOUNTER — Encounter: Payer: Self-pay | Admitting: Family

## 2022-05-25 DIAGNOSIS — A084 Viral intestinal infection, unspecified: Secondary | ICD-10-CM | POA: Diagnosis not present

## 2022-05-25 MED ORDER — ONDANSETRON 4 MG PO TBDP: 4.0000 mg | ORAL_TABLET | Freq: Three times a day (TID) | ORAL | 1 refills | Status: DC | PRN

## 2022-05-25 NOTE — Progress Notes (Signed)
Virtual Visit Consent   Molly Castaneda, you are scheduled for a virtual visit with a [facility] provider today. Just as with appointments in the office, your consent must be obtained to participate. Your consent will be active for this visit and any virtual visit you may have with one of our providers in the next 365 days. If you have a MyChart account, a copy of this consent can be sent to you electronically.  As this is a virtual visit, video technology does not allow for your provider to perform a traditional examination. This may limit your provider's ability to fully assess your condition. If your provider identifies any concerns that need to be evaluated in person or the need to arrange testing (such as labs, EKG, etc.), we will make arrangements to do so. Although advances in technology are sophisticated, we cannot ensure that it will always work on either your end or our end. If the connection with a video visit is poor, the visit may have to be switched to a telephone visit. With either a video or telephone visit, we are not always able to ensure that we have a secure connection.  By engaging in this virtual visit, you consent to the provision of healthcare and authorize for your insurance to be billed (if applicable) for the services provided during this visit. Depending on your insurance coverage, you may receive a charge related to this service.  I need to obtain your verbal consent now. Are you willing to proceed with your visit today? Molly Castaneda has provided verbal consent on 05/25/2022 for a virtual visit (video or telephone). [name], FNP  Date: 05/25/2022 11:00 AM  Virtual Visit via Video Note   I, [name], connected with  [name]  ([id number], [birth year]) on 05/25/22 at  6:00 PM EDT by a video-enabled telemedicine application and verified that I am speaking with the correct person using two identifiers.  Location: Patient: Virtual Visit Location Patient:  Home Provider: Virtual Visit Location Provider: Office/Clinic   I discussed the limitations of evaluation and management by telemedicine and the availability of in person appointments. The patient expressed understanding and agreed to proceed.    History of Present Illness: Molly Castaneda is a 62 y.o. who identifies as a female who was assigned female at birth, and is being seen today for diarrhea, vomiting that started two days ago.  HPI: Emesis  This is a new problem. The current episode started in the past 7 days. The problem occurs 5 to 10 times per day. The problem has been unchanged. The emesis has an appearance of stomach contents. There has been no fever. Associated symptoms include chills and diarrhea. Pertinent negatives include no coughing, dizziness, fever or myalgias. She has tried acetaminophen and bed rest for the symptoms. The treatment provided mild relief.  Diarrhea  Associated symptoms include chills and vomiting. Pertinent negatives include no coughing, fever or myalgias.    Problems:  Patient Active Problem List   Diagnosis Date Noted   Unilateral primary osteoarthritis, left knee 05/24/2021   Pain in right leg 07/19/2020   Pain in right knee 07/19/2020   Hyperlipemia 10/14/2018   Adhesive capsulitis of left shoulder 05/14/2018   Chronic left shoulder pain 09/18/2017   Cervicalgia 09/18/2017   Overweight (BMI 25.0-29.9) 05/20/2017   Peripheral edema 09/07/2015   Vitamin D deficiency 06/22/2014   Depression 06/21/2014   GAD (generalized anxiety disorder) 06/21/2014   Back pain 06/21/2014   Left ankle  pain 10/16/2013   Osteopenia 02/11/2013   Left sided sciatica 01/11/2012   Constipation 05/08/2010    Allergies:  Allergies  Allergen Reactions   Oxycodone-Acetaminophen Other (See Comments)    Chest Pains Patient can tolerate acetaminophen   Oxycontin [Oxycodone Hcl] Other (See Comments)    Causes chest pain   Hydrocodone Other (See Comments)    Oxycodone-Acetaminophen Other (See Comments)   Medications:  Current Outpatient Medications:    ondansetron (ZOFRAN-ODT) 4 MG disintegrating tablet, Take 1 tablet (4 mg total) by mouth every 8 (eight) hours as needed for nausea or vomiting., Disp: 60 tablet, Rfl: 1   amphetamine-dextroamphetamine (ADDERALL) 10 MG tablet, Take 10 mg by mouth 2 (two) times daily with a meal. , Disp: , Rfl:    aspirin 81 MG chewable tablet, Chew by mouth daily., Disp: , Rfl:    benzonatate (TESSALON PERLES) 100 MG capsule, Take 1 capsule (100 mg total) by mouth 3 (three) times daily as needed for cough., Disp: 20 capsule, Rfl: 0   buPROPion (WELLBUTRIN SR) 150 MG 12 hr tablet, Take 1 tablet (150 mg total) by mouth 2 (two) times daily. (Needs to be seen before next refill), Disp: 60 tablet, Rfl: 0   Calcium Carbonate 500 MG CHEW, Chew 1 tablet (500 mg total) by mouth daily., Disp: 30 each, Rfl:    diclofenac (VOLTAREN) 75 MG EC tablet, TAKE 1 TABLET BY MOUTH TWICE A DAY, Disp: 180 tablet, Rfl: 0   estradiol (ESTRACE) 1 MG tablet, Take 1 tablet (1 mg total) by mouth daily., Disp: 90 tablet, Rfl: 1   fluticasone (FLONASE) 50 MCG/ACT nasal spray, Place 2 sprays into both nostrils daily., Disp: 16 g, Rfl: 6   furosemide (LASIX) 40 MG tablet, TAKE 1 TABLET BY MOUTH EVERY DAY, Disp: 90 tablet, Rfl: 0   levocetirizine (XYZAL) 5 MG tablet, TAKE 1 TABLET BY MOUTH EVERY DAY IN THE EVENING, Disp: 90 tablet, Rfl: 1   mirtazapine (REMERON) 7.5 MG tablet, Take 7.5 mg by mouth at bedtime., Disp: , Rfl:    morphine (MS CONTIN) 15 MG 12 hr tablet, Take 15 mg by mouth 3 (three) times daily., Disp: , Rfl: 0   naloxegol oxalate (MOVANTIK) 25 MG TABS tablet, Take 1 tablet (25 mg total) by mouth daily., Disp: 90 tablet, Rfl: 2   SUMAtriptan (IMITREX) 25 MG tablet, Take 25 mg by mouth every 2 (two) hours as needed for migraine. May repeat in 2 hours if headache persists or recurs., Disp: , Rfl:    tiZANidine (ZANAFLEX) 4 MG tablet, Take 1  Tablet by mouth at bedtime as needed FOR MUSCLE SPASMS, Disp: 60 tablet, Rfl: 5   topiramate (TOPAMAX) 50 MG tablet, Take 50 mg by mouth 2 (two) times daily., Disp: , Rfl:    triamcinolone ointment (KENALOG) 0.5 %, Apply 1 application  topically 2 (two) times daily., Disp: 30 g, Rfl: 0   vitamin B-12 (CYANOCOBALAMIN) 1000 MCG tablet, Take 1,000 mcg by mouth daily., Disp: , Rfl:    Vitamin D, Cholecalciferol, 1000 units TABS, Take 1,000 Units by mouth daily., Disp: 60 tablet, Rfl:   Observations/Objective: Patient is well-developed, well-nourished in no acute distress.  Resting comfortably  at home.  Head is normocephalic, atraumatic.  No labored breathing.  Speech is clear and coherent with logical content.  Patient is alert and oriented at baseline.  Laying in bed  Assessment and Plan: 1. Viral gastroenteritis - ondansetron (ZOFRAN-ODT) 4 MG disintegrating tablet; Take 1 tablet (4 mg total) by  mouth every 8 (eight) hours as needed for nausea or vomiting.  Dispense: 60 tablet; Refill: 1  Bland diet  Imodium as needed  Zofran as needed Force fluids Tylenol as needed Avoid red dyes  Follow up if symptoms worsen or do not improve   Follow Up Instructions: I discussed the assessment and treatment plan with the patient. The patient was provided an opportunity to ask questions and all were answered. The patient agreed with the plan and demonstrated an understanding of the instructions.  A copy of instructions were sent to the patient via MyChart unless otherwise noted below.    The patient was advised to call back or seek an in-person evaluation if the symptoms worsen or if the condition fails to improve as anticipated.  Time:  I spent 9 minutes with the patient via telehealth technology discussing the above problems/concerns.    [name], FNP

## 2022-06-05 DIAGNOSIS — F4323 Adjustment disorder with mixed anxiety and depressed mood: Secondary | ICD-10-CM | POA: Diagnosis not present

## 2022-06-05 DIAGNOSIS — F1120 Opioid dependence, uncomplicated: Secondary | ICD-10-CM | POA: Diagnosis not present

## 2022-06-05 DIAGNOSIS — F411 Generalized anxiety disorder: Secondary | ICD-10-CM | POA: Diagnosis not present

## 2022-06-05 DIAGNOSIS — F332 Major depressive disorder, recurrent severe without psychotic features: Secondary | ICD-10-CM | POA: Diagnosis not present

## 2022-06-19 DIAGNOSIS — M79642 Pain in left hand: Secondary | ICD-10-CM | POA: Diagnosis not present

## 2022-06-21 DIAGNOSIS — F4323 Adjustment disorder with mixed anxiety and depressed mood: Secondary | ICD-10-CM | POA: Diagnosis not present

## 2022-06-21 DIAGNOSIS — F411 Generalized anxiety disorder: Secondary | ICD-10-CM | POA: Diagnosis not present

## 2022-06-21 DIAGNOSIS — F1120 Opioid dependence, uncomplicated: Secondary | ICD-10-CM | POA: Diagnosis not present

## 2022-06-21 DIAGNOSIS — F332 Major depressive disorder, recurrent severe without psychotic features: Secondary | ICD-10-CM | POA: Diagnosis not present

## 2022-07-12 ENCOUNTER — Ambulatory Visit (INDEPENDENT_AMBULATORY_CARE_PROVIDER_SITE_OTHER): Payer: Medicare HMO | Admitting: Family

## 2022-07-12 ENCOUNTER — Encounter: Payer: Self-pay | Admitting: Family

## 2022-07-12 VITALS — BP 106/63 | HR 70 | Temp 97.3°F | Ht 73.0 in | Wt 205.0 lb

## 2022-07-12 DIAGNOSIS — K5903 Drug induced constipation: Secondary | ICD-10-CM

## 2022-07-12 DIAGNOSIS — F332 Major depressive disorder, recurrent severe without psychotic features: Secondary | ICD-10-CM | POA: Diagnosis not present

## 2022-07-12 DIAGNOSIS — F4323 Adjustment disorder with mixed anxiety and depressed mood: Secondary | ICD-10-CM | POA: Diagnosis not present

## 2022-07-12 DIAGNOSIS — F331 Major depressive disorder, recurrent, moderate: Secondary | ICD-10-CM | POA: Diagnosis not present

## 2022-07-12 DIAGNOSIS — E559 Vitamin D deficiency, unspecified: Secondary | ICD-10-CM | POA: Diagnosis not present

## 2022-07-12 DIAGNOSIS — Z23 Encounter for immunization: Secondary | ICD-10-CM

## 2022-07-12 DIAGNOSIS — Z0000 Encounter for general adult medical examination without abnormal findings: Secondary | ICD-10-CM

## 2022-07-12 DIAGNOSIS — E663 Overweight: Secondary | ICD-10-CM

## 2022-07-12 DIAGNOSIS — M5442 Lumbago with sciatica, left side: Secondary | ICD-10-CM | POA: Diagnosis not present

## 2022-07-12 DIAGNOSIS — F411 Generalized anxiety disorder: Secondary | ICD-10-CM

## 2022-07-12 DIAGNOSIS — J301 Allergic rhinitis due to pollen: Secondary | ICD-10-CM | POA: Diagnosis not present

## 2022-07-12 DIAGNOSIS — M8589 Other specified disorders of bone density and structure, multiple sites: Secondary | ICD-10-CM

## 2022-07-12 DIAGNOSIS — E785 Hyperlipidemia, unspecified: Secondary | ICD-10-CM | POA: Diagnosis not present

## 2022-07-12 DIAGNOSIS — Z0001 Encounter for general adult medical examination with abnormal findings: Secondary | ICD-10-CM | POA: Diagnosis not present

## 2022-07-12 DIAGNOSIS — F1120 Opioid dependence, uncomplicated: Secondary | ICD-10-CM | POA: Diagnosis not present

## 2022-07-12 DIAGNOSIS — G8929 Other chronic pain: Secondary | ICD-10-CM | POA: Diagnosis not present

## 2022-07-12 DIAGNOSIS — M5432 Sciatica, left side: Secondary | ICD-10-CM

## 2022-07-12 MED ORDER — LEVOCETIRIZINE DIHYDROCHLORIDE 5 MG PO TABS: 5.0000 mg | ORAL_TABLET | Freq: Every evening | ORAL | 1 refills | Status: DC

## 2022-07-12 NOTE — Patient Instructions (Signed)
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
## 2022-07-13 ENCOUNTER — Other Ambulatory Visit: Payer: Self-pay | Admitting: Family

## 2022-07-13 LAB — LIPID PANEL
Chol/HDL Ratio: 3 ratio (ref 0.0–4.4)
Cholesterol, Total: 222 mg/dL — ABNORMAL HIGH (ref 100–199)
HDL: 75 mg/dL (ref >39)
LDL Chol Calc (NIH): 133 mg/dL — ABNORMAL HIGH (ref 0–99)
Triglycerides: 78 mg/dL (ref 0–149)
VLDL Cholesterol Cal: 14 mg/dL (ref 5–40)

## 2022-07-13 LAB — CBC WITH DIFFERENTIAL/PLATELET
Basophils Absolute: 0 10*3/uL (ref 0.0–0.2)
Basos: 1 %
EOS (ABSOLUTE): 0.1 10*3/uL (ref 0.0–0.4)
Eos: 2 %
Hematocrit: 43.7 % (ref 34.0–46.6)
Hemoglobin: 13.6 g/dL (ref 11.1–15.9)
Immature Grans (Abs): 0 10*3/uL (ref 0.0–0.1)
Immature Granulocytes: 0 %
Lymphocytes Absolute: 1.6 10*3/uL (ref 0.7–3.1)
Lymphs: 27 %
MCH: 26.4 pg — ABNORMAL LOW (ref 26.6–33.0)
MCHC: 31.1 g/dL — ABNORMAL LOW (ref 31.5–35.7)
MCV: 85 fL (ref 79–97)
Monocytes Absolute: 0.6 10*3/uL (ref 0.1–0.9)
Monocytes: 9 %
Neutrophils Absolute: 3.8 10*3/uL (ref 1.4–7.0)
Neutrophils: 61 %
Platelets: 343 10*3/uL (ref 150–450)
RBC: 5.16 x10E6/uL (ref 3.77–5.28)
RDW: 12.8 % (ref 11.7–15.4)
WBC: 6.2 10*3/uL (ref 3.4–10.8)

## 2022-07-13 LAB — CMP14+EGFR
ALT: 18 IU/L (ref 0–32)
AST: 22 IU/L (ref 0–40)
Albumin/Globulin Ratio: 1.6 (ref 1.2–2.2)
Albumin: 4.3 g/dL (ref 3.9–4.9)
Alkaline Phosphatase: 92 IU/L (ref 44–121)
BUN/Creatinine Ratio: 14 (ref 12–28)
BUN: 14 mg/dL (ref 8–27)
Bilirubin Total: 0.6 mg/dL (ref 0.0–1.2)
CO2: 21 mmol/L (ref 20–29)
Calcium: 9.4 mg/dL (ref 8.7–10.3)
Chloride: 106 mmol/L (ref 96–106)
Creatinine, Ser: 0.98 mg/dL (ref 0.57–1.00)
Globulin, Total: 2.7 g/dL (ref 1.5–4.5)
Glucose: 83 mg/dL (ref 70–99)
Potassium: 4.6 mmol/L (ref 3.5–5.2)
Sodium: 141 mmol/L (ref 134–144)
Total Protein: 7 g/dL (ref 6.0–8.5)
eGFR: 64 mL/min/{1.73_m2} (ref >59)

## 2022-07-13 LAB — VITAMIN D 25 HYDROXY (VIT D DEFICIENCY, FRACTURES): Vit D, 25-Hydroxy: 60 ng/mL (ref 30.0–100.0)

## 2022-07-13 LAB — TSH: TSH: 0.914 u[IU]/mL (ref 0.450–4.500)

## 2022-07-17 DIAGNOSIS — M79642 Pain in left hand: Secondary | ICD-10-CM | POA: Diagnosis not present

## 2022-07-18 DIAGNOSIS — F332 Major depressive disorder, recurrent severe without psychotic features: Secondary | ICD-10-CM | POA: Diagnosis not present

## 2022-07-18 DIAGNOSIS — F411 Generalized anxiety disorder: Secondary | ICD-10-CM | POA: Diagnosis not present

## 2022-07-18 DIAGNOSIS — F4323 Adjustment disorder with mixed anxiety and depressed mood: Secondary | ICD-10-CM | POA: Diagnosis not present

## 2022-07-18 DIAGNOSIS — F1120 Opioid dependence, uncomplicated: Secondary | ICD-10-CM | POA: Diagnosis not present

## 2022-07-20 DIAGNOSIS — M961 Postlaminectomy syndrome, not elsewhere classified: Secondary | ICD-10-CM | POA: Diagnosis not present

## 2022-07-20 DIAGNOSIS — M25562 Pain in left knee: Secondary | ICD-10-CM | POA: Diagnosis not present

## 2022-07-20 DIAGNOSIS — T402X5A Adverse effect of other opioids, initial encounter: Secondary | ICD-10-CM | POA: Diagnosis not present

## 2022-07-20 DIAGNOSIS — F1190 Opioid use, unspecified, uncomplicated: Secondary | ICD-10-CM | POA: Diagnosis not present

## 2022-07-20 DIAGNOSIS — G894 Chronic pain syndrome: Secondary | ICD-10-CM | POA: Diagnosis not present

## 2022-07-20 DIAGNOSIS — Z79891 Long term (current) use of opiate analgesic: Secondary | ICD-10-CM | POA: Diagnosis not present

## 2022-07-20 DIAGNOSIS — M545 Low back pain, unspecified: Secondary | ICD-10-CM | POA: Diagnosis not present

## 2022-07-20 DIAGNOSIS — G8929 Other chronic pain: Secondary | ICD-10-CM | POA: Diagnosis not present

## 2022-07-20 DIAGNOSIS — K5903 Drug induced constipation: Secondary | ICD-10-CM | POA: Diagnosis not present

## 2022-07-26 DIAGNOSIS — F1120 Opioid dependence, uncomplicated: Secondary | ICD-10-CM | POA: Diagnosis not present

## 2022-07-26 DIAGNOSIS — F332 Major depressive disorder, recurrent severe without psychotic features: Secondary | ICD-10-CM | POA: Diagnosis not present

## 2022-07-26 DIAGNOSIS — F411 Generalized anxiety disorder: Secondary | ICD-10-CM | POA: Diagnosis not present

## 2022-07-26 DIAGNOSIS — F4323 Adjustment disorder with mixed anxiety and depressed mood: Secondary | ICD-10-CM | POA: Diagnosis not present

## 2022-08-08 DIAGNOSIS — F332 Major depressive disorder, recurrent severe without psychotic features: Secondary | ICD-10-CM | POA: Diagnosis not present

## 2022-08-08 DIAGNOSIS — F1120 Opioid dependence, uncomplicated: Secondary | ICD-10-CM | POA: Diagnosis not present

## 2022-08-08 DIAGNOSIS — F4323 Adjustment disorder with mixed anxiety and depressed mood: Secondary | ICD-10-CM | POA: Diagnosis not present

## 2022-08-08 DIAGNOSIS — F411 Generalized anxiety disorder: Secondary | ICD-10-CM | POA: Diagnosis not present

## 2022-08-22 ENCOUNTER — Telehealth: Payer: Self-pay | Admitting: Family Medicine

## 2022-08-23 DIAGNOSIS — F332 Major depressive disorder, recurrent severe without psychotic features: Secondary | ICD-10-CM | POA: Diagnosis not present

## 2022-08-23 DIAGNOSIS — F411 Generalized anxiety disorder: Secondary | ICD-10-CM | POA: Diagnosis not present

## 2022-08-23 DIAGNOSIS — F1120 Opioid dependence, uncomplicated: Secondary | ICD-10-CM | POA: Diagnosis not present

## 2022-08-23 DIAGNOSIS — F4323 Adjustment disorder with mixed anxiety and depressed mood: Secondary | ICD-10-CM | POA: Diagnosis not present

## 2022-08-29 ENCOUNTER — Telehealth (INDEPENDENT_AMBULATORY_CARE_PROVIDER_SITE_OTHER): Payer: Medicare HMO | Admitting: Nurse Practitioner

## 2022-08-29 ENCOUNTER — Encounter: Payer: Self-pay | Admitting: Nurse Practitioner

## 2022-08-29 DIAGNOSIS — R0602 Shortness of breath: Secondary | ICD-10-CM | POA: Insufficient documentation

## 2022-08-29 DIAGNOSIS — R051 Acute cough: Secondary | ICD-10-CM | POA: Diagnosis not present

## 2022-08-29 DIAGNOSIS — M791 Myalgia, unspecified site: Secondary | ICD-10-CM | POA: Diagnosis not present

## 2022-08-29 LAB — VERITOR FLU A/B WAIVED
Influenza A: NEGATIVE
Influenza B: NEGATIVE

## 2022-08-29 MED ORDER — AMOXICILLIN 875 MG PO TABS: 875.0000 mg | ORAL_TABLET | Freq: Two times a day (BID) | ORAL | 0 refills | Status: DC
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
## 2022-08-30 ENCOUNTER — Telehealth: Payer: Self-pay | Admitting: Family

## 2022-08-30 ENCOUNTER — Other Ambulatory Visit: Payer: Self-pay | Admitting: Nurse Practitioner

## 2022-08-30 DIAGNOSIS — U071 COVID-19: Secondary | ICD-10-CM

## 2022-08-30 LAB — NOVEL CORONAVIRUS, NAA: SARS-CoV-2, NAA: DETECTED — AB

## 2022-08-30 MED ORDER — NIRMATRELVIR/RITONAVIR (PAXLOVID)TABLET
3.0000 | ORAL_TABLET | Freq: Two times a day (BID) | ORAL | 0 refills | Status: AC
Start: 2022-08-30 — End: 2022-09-04

## 2022-08-30 NOTE — Telephone Encounter (Signed)
Pt not happy with response. States that we always call her in something.  Advised pt to try OTC for now and if she develops yeast symptoms ( white discharge, itching) to call the office back with an update. Pt understood.

## 2022-08-30 NOTE — Telephone Encounter (Signed)
She was just started on antibiotics yesterday. She can use over the counter topicals and if symptoms persist post antibiotic completion, will then need to be treated if warranted.

## 2022-09-05 ENCOUNTER — Encounter: Payer: Self-pay | Admitting: Family Medicine

## 2022-09-05 ENCOUNTER — Telehealth (INDEPENDENT_AMBULATORY_CARE_PROVIDER_SITE_OTHER): Payer: Medicare HMO | Admitting: Family Medicine

## 2022-09-05 DIAGNOSIS — R058 Other specified cough: Secondary | ICD-10-CM | POA: Diagnosis not present

## 2022-09-05 MED ORDER — BENZONATATE 200 MG PO CAPS: 200.0000 mg | ORAL_CAPSULE | Freq: Two times a day (BID) | ORAL | 1 refills | Status: DC | PRN

## 2022-09-05 NOTE — Progress Notes (Signed)
Virtual Visit via MyChart video note  I connected with Molly Castaneda on 09/05/22 at 1327 by video and verified that I am speaking with the correct person using two identifiers. Molly Castaneda is currently located at home and patient are currently with her during visit. The provider, Elige Radon Kemari Mares, MD is located in their office at time of visit.  Call ended at 1333  I discussed the limitations, risks, security and privacy concerns of performing an evaluation and management service by video and the availability of in person appointments. I also discussed with the patient that there may be a patient responsible charge related to this service. The patient expressed understanding and agreed to proceed.   History and Present Illness: Patient is calling in for cough, she had covid and is getting over it.  She started 1 week and is better except for cough. Her cough is productive. She denies fevers or chills or SOB or wheezing.  She says rattling in chest is improved.   1. Post-viral cough syndrome     Outpatient Encounter Medications as of 09/05/2022  Medication Sig   benzonatate (TESSALON) 200 MG capsule Take 1 capsule (200 mg total) by mouth 2 (two) times daily as needed for cough.   amoxicillin (AMOXIL) 875 MG tablet Take 1 tablet (875 mg total) by mouth 2 (two) times daily.   amphetamine-dextroamphetamine (ADDERALL) 10 MG tablet Take 10 mg by mouth 2 (two) times daily with a meal.    aspirin 81 MG chewable tablet Chew by mouth daily.   buPROPion (WELLBUTRIN SR) 150 MG 12 hr tablet Take 1 tablet (150 mg total) by mouth 2 (two) times daily. (Needs to be seen before next refill)   Calcium Carbonate 500 MG CHEW Chew 1 tablet (500 mg total) by mouth daily.   diclofenac (VOLTAREN) 75 MG EC tablet TAKE 1 TABLET BY MOUTH TWICE A DAY   estradiol (ESTRACE) 1 MG tablet Take 1 tablet (1 mg total) by mouth daily.   fluticasone (FLONASE) 50 MCG/ACT nasal spray Place 2 sprays into both nostrils daily.    furosemide (LASIX) 40 MG tablet TAKE 1 TABLET BY MOUTH EVERY DAY   levocetirizine (XYZAL) 5 MG tablet Take 1 tablet (5 mg total) by mouth every evening.   mirtazapine (REMERON) 7.5 MG tablet Take 7.5 mg by mouth at bedtime.   morphine (MS CONTIN) 15 MG 12 hr tablet Take 15 mg by mouth 3 (three) times daily.   naloxegol oxalate (MOVANTIK) 25 MG TABS tablet Take 1 tablet (25 mg total) by mouth daily.   SUMAtriptan (IMITREX) 25 MG tablet Take 25 mg by mouth every 2 (two) hours as needed for migraine. May repeat in 2 hours if headache persists or recurs.   tiZANidine (ZANAFLEX) 4 MG tablet Take 1 Tablet by mouth at bedtime as needed FOR MUSCLE SPASMS   topiramate (TOPAMAX) 50 MG tablet Take 50 mg by mouth 2 (two) times daily.   vitamin B-12 (CYANOCOBALAMIN) 1000 MCG tablet Take 1,000 mcg by mouth daily.   Vitamin D, Cholecalciferol, 1000 units TABS Take 1,000 Units by mouth daily.   No facility-administered encounter medications on file as of 09/05/2022.    Review of Systems  Constitutional:  Negative for chills and fever.  HENT:  Positive for congestion. Negative for ear discharge, ear pain, postnasal drip, rhinorrhea, sinus pressure, sneezing and sore throat.   Eyes:  Negative for pain, redness and visual disturbance.  Respiratory:  Positive for cough. Negative for chest tightness,  shortness of breath and wheezing.   Cardiovascular:  Negative for chest pain and leg swelling.  Genitourinary:  Negative for difficulty urinating and dysuria.  Musculoskeletal:  Negative for back pain and gait problem.  Skin:  Negative for rash.  Neurological:  Negative for light-headedness and headaches.  Psychiatric/Behavioral:  Negative for agitation and behavioral problems.   All other systems reviewed and are negative.   Observations/Objective: Patient sounds comfortable and in no acute distress  Assessment and Plan: Problem List Items Addressed This Visit   None Visit Diagnoses     Post-viral  cough syndrome    -  Primary   Relevant Medications   benzonatate (TESSALON) 200 MG capsule     Patient says she is on Mobic Tessalon Perles before once in about out with a cough.  She tried over-the-counter cough medicine as needed did not seem to help as much.  She will also try some over-the-counter Mucinex, plain Mucinex to see if they can help the congestion as well.  Follow up plan: Return if symptoms worsen or fail to improve.     I discussed the assessment and treatment plan with the patient. The patient was provided an opportunity to ask questions and all were answered. The patient agreed with the plan and demonstrated an understanding of the instructions.   The patient was advised to call back or seek an in-person evaluation if the symptoms worsen or if the condition fails to improve as anticipated.  The above assessment and management plan was discussed with the patient. The patient verbalized understanding of and has agreed to the management plan. Patient is aware to call the clinic if symptoms persist or worsen. Patient is aware when to return to the clinic for a follow-up visit. Patient educated on when it is appropriate to go to the emergency department.    I provided 6 minutes of non-face-to-face time during this encounter.    Nils Pyle, MD

## 2022-09-14 ENCOUNTER — Encounter: Payer: Self-pay | Admitting: Family Medicine

## 2022-09-14 ENCOUNTER — Ambulatory Visit (INDEPENDENT_AMBULATORY_CARE_PROVIDER_SITE_OTHER): Payer: Medicare HMO

## 2022-09-14 ENCOUNTER — Ambulatory Visit (INDEPENDENT_AMBULATORY_CARE_PROVIDER_SITE_OTHER): Payer: Medicare HMO | Admitting: Family Medicine

## 2022-09-14 VITALS — BP 101/67 | HR 84 | Temp 98.5°F | Ht 73.0 in | Wt 203.0 lb

## 2022-09-14 DIAGNOSIS — Z4789 Encounter for other orthopedic aftercare: Secondary | ICD-10-CM | POA: Diagnosis not present

## 2022-09-14 DIAGNOSIS — S60922A Unspecified superficial injury of left hand, initial encounter: Secondary | ICD-10-CM | POA: Diagnosis not present

## 2022-09-14 DIAGNOSIS — M79642 Pain in left hand: Secondary | ICD-10-CM | POA: Diagnosis not present

## 2022-09-14 DIAGNOSIS — M1812 Unilateral primary osteoarthritis of first carpometacarpal joint, left hand: Secondary | ICD-10-CM | POA: Diagnosis not present

## 2022-09-14 DIAGNOSIS — W19XXXA Unspecified fall, initial encounter: Secondary | ICD-10-CM | POA: Diagnosis not present

## 2022-09-14 NOTE — Progress Notes (Signed)
Acute Office Visit  Subjective:  Patient ID: Molly Castaneda, female    DOB: 1960-04-04, 62 y.o.   MRN: [id number]  Chief Complaint  Patient presents with   left  hand pain    Fall, initial encounter   HPI Patient is in today for fall.  States that she fell in the rain trying to bring in groceries. Larey Seat off of the side of the porch.  Larey Seat forward and tried to catch herself with her left hand. Her fingers bent backwards. She has been using ice for pain management as well as her previously prescribed MS contin.  Hurts across the top of her fingers and is unable to form grip.  States that it is improving, but is till swollen and painful  Had surgery on middle finger of left hand in February, is set up to have additional surgeries.   ROS As per HPI  Objective:  BP 101/67   Pulse 84   Temp 98.5 F (36.9 C)   Ht 6\' 1"  (1.854 m)   Wt 205 lb (93 kg)   SpO2 98%   BMI 27.05 kg/m   Physical Exam Constitutional:      General: She is awake. She is not in acute distress.    Appearance: Normal appearance. She is well-developed and well-groomed. She is not ill-appearing, toxic-appearing or diaphoretic.  Cardiovascular:     Rate and Rhythm: Normal rate and regular rhythm.     Pulses: Normal pulses.          Radial pulses are 2+ on the right side and 2+ on the left side.       Posterior tibial pulses are 2+ on the right side and 2+ on the left side.     Heart sounds: Normal heart sounds. No murmur heard.    No gallop.  Pulmonary:     Effort: Pulmonary effort is normal. No respiratory distress.     Breath sounds: Normal breath sounds. No stridor. No wheezing, rhonchi or rales.  Musculoskeletal:     Left hand: Swelling, deformity, tenderness and bony tenderness present. Decreased range of motion. Decreased strength. Normal capillary refill. Normal pulse.     Cervical back: Full passive range of motion without pain and neck supple.     Right lower leg: No edema.     Left lower leg: No  edema.     Comments: Decreased strength due to pain. Surgical scar and deformity of left middle finger   Skin:    General: Skin is warm.     Capillary Refill: Capillary refill takes less than 2 seconds.  Neurological:     General: No focal deficit present.     Mental Status: She is alert, oriented to person, place, and time and easily aroused. Mental status is at baseline.     GCS: GCS eye subscore is 4. GCS verbal subscore is 5. GCS motor subscore is 6.     Motor: No weakness.  Psychiatric:        Attention and Perception: Attention and perception normal.        Mood and Affect: Mood and affect normal.        Speech: Speech normal.        Behavior: Behavior normal. Behavior is cooperative.        Thought Content: Thought content normal. Thought content does not include homicidal or suicidal ideation. Thought content does not include homicidal or suicidal plan.        Cognition and Memory:  Cognition and memory normal.        Judgment: Judgment normal.    Assessment & Plan:  1. Fall, initial encounter Imaging as below. Will communicate results to patient once available. Will await results to determine next steps.  Discussed with patient continuing to RICE the site. She is intolerant to NSAIDs.  - DG Hand Complete Left  The above assessment and management plan was discussed with the patient. The patient verbalized understanding of and has agreed to the management plan using shared-decision making. Patient is aware to call the clinic if they develop any new symptoms or if symptoms fail to improve or worsen. Patient is aware when to return to the clinic for a follow-up visit. Patient educated on when it is appropriate to go to the emergency department.   Return if symptoms worsen or fail to improve.  Neale Burly, DNP-FNP [facility] [address] [place], [place] [address] [phone]

## 2022-09-20 DIAGNOSIS — F332 Major depressive disorder, recurrent severe without psychotic features: Secondary | ICD-10-CM | POA: Diagnosis not present

## 2022-09-20 DIAGNOSIS — F1120 Opioid dependence, uncomplicated: Secondary | ICD-10-CM | POA: Diagnosis not present

## 2022-09-20 DIAGNOSIS — F411 Generalized anxiety disorder: Secondary | ICD-10-CM | POA: Diagnosis not present

## 2022-09-20 DIAGNOSIS — F4323 Adjustment disorder with mixed anxiety and depressed mood: Secondary | ICD-10-CM | POA: Diagnosis not present

## 2022-09-25 ENCOUNTER — Ambulatory Visit (INDEPENDENT_AMBULATORY_CARE_PROVIDER_SITE_OTHER): Payer: Medicare HMO

## 2022-09-25 ENCOUNTER — Telehealth: Payer: Self-pay | Admitting: Family

## 2022-09-25 VITALS — Ht 72.0 in | Wt 204.0 lb

## 2022-09-25 DIAGNOSIS — Z0000 Encounter for general adult medical examination without abnormal findings: Secondary | ICD-10-CM

## 2022-09-25 NOTE — Telephone Encounter (Signed)
Patient wants to schedule a DEXA on 8/29 if possible
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
## 2022-09-25 NOTE — Patient Instructions (Signed)
[name] , Thank you for taking time to come for your Medicare Wellness Visit. I appreciate your ongoing commitment to your health goals. Please review the following plan we discussed and let me know if I can assist you in the future.   Referrals/Orders/Follow-Ups/Clinician Recommendations: Aim for 30 minutes of exercise or brisk walking, 6-8 glasses of water, and 5 servings of fruits and vegetables each day.   This is a list of the screening recommended for you and due dates:  Health Maintenance  Topic Date Due   Pap Smear  02/06/2016   DEXA scan (bone density measurement)  04/12/2021   COVID-19 Vaccine (4 - 2023-24 season) 10/06/2021   Flu Shot  09/06/2022   Mammogram  02/01/2023   Medicare Annual Wellness Visit  09/25/2023   Colon Cancer Screening  05/25/2024   DTaP/Tdap/Td vaccine (3 - Td or Tdap) 07/11/2032   Hepatitis C Screening  Completed   HIV Screening  Completed   Zoster (Shingles) Vaccine  Completed   HPV Vaccine  Aged Out    Advanced directives: (Copy Requested) Please bring a copy of your health care power of attorney and living will to the office to be added to your chart at your convenience.  Next Medicare Annual Wellness Visit scheduled for next year: Yes  Preventive Care 40-64 Years, Female Preventive care refers to lifestyle choices and visits with your health care provider that can promote health and wellness. What does preventive care include? A yearly physical exam. This is also called an annual well check. Dental exams once or twice a year. Routine eye exams. Ask your health care provider how often you should have your eyes checked. Personal lifestyle choices, including: Daily care of your teeth and gums. Regular physical activity. Eating a healthy diet. Avoiding tobacco and drug use. Limiting alcohol use. Practicing safe sex. Taking low-dose aspirin daily starting at age 29. Taking vitamin and mineral supplements as recommended by your health care  provider. What happens during an annual well check? The services and screenings done by your health care provider during your annual well check will depend on your age, overall health, lifestyle risk factors, and family history of disease. Counseling  Your health care provider may ask you questions about your: Alcohol use. Tobacco use. Drug use. Emotional well-being. Home and relationship well-being. Sexual activity. Eating habits. Work and work Astronomer. Method of birth control. Menstrual cycle. Pregnancy history. Screening  You may have the following tests or measurements: Height, weight, and BMI. Blood pressure. Lipid and cholesterol levels. These may be checked every 5 years, or more frequently if you are over 37 years old. Skin check. Lung cancer screening. You may have this screening every year starting at age 96 if you have a 30-pack-year history of smoking and currently smoke or have quit within the past 15 years. Fecal occult blood test (FOBT) of the stool. You may have this test every year starting at age 80. Flexible sigmoidoscopy or colonoscopy. You may have a sigmoidoscopy every 5 years or a colonoscopy every 10 years starting at age 67. Hepatitis C blood test. Hepatitis B blood test. Sexually transmitted disease (STD) testing. Diabetes screening. This is done by checking your blood sugar (glucose) after you have not eaten for a while (fasting). You may have this done every 1-3 years. Mammogram. This may be done every 1-2 years. Talk to your health care provider about when you should start having regular mammograms. This may depend on whether you have a family history  of breast cancer. BRCA-related cancer screening. This may be done if you have a family history of breast, ovarian, tubal, or peritoneal cancers. Pelvic exam and Pap test. This may be done every 3 years starting at age 21. Starting at age 1, this may be done every 5 years if you have a Pap test in  combination with an HPV test. Bone density scan. This is done to screen for osteoporosis. You may have this scan if you are at high risk for osteoporosis. Discuss your test results, treatment options, and if necessary, the need for more tests with your health care provider. Vaccines  Your health care provider may recommend certain vaccines, such as: Influenza vaccine. This is recommended every year. Tetanus, diphtheria, and acellular pertussis (Tdap, Td) vaccine. You may need a Td booster every 10 years. Zoster vaccine. You may need this after age 35. Pneumococcal 13-valent conjugate (PCV13) vaccine. You may need this if you have certain conditions and were not previously vaccinated. Pneumococcal polysaccharide (PPSV23) vaccine. You may need one or two doses if you smoke cigarettes or if you have certain conditions. Talk to your health care provider about which screenings and vaccines you need and how often you need them. This information is not intended to replace advice given to you by your health care provider. Make sure you discuss any questions you have with your health care provider. Document Released: 02/18/2015 Document Revised: 10/12/2015 Document Reviewed: 11/23/2014 Elsevier Interactive Patient Education  2017 [employer].    Fall Prevention in the Home Falls can cause injuries. They can happen to people of all ages. There are many things you can do to make your home safe and to help prevent falls. What can I do on the outside of my home? Regularly fix the edges of walkways and driveways and fix any cracks. Remove anything that might make you trip as you walk through a door, such as a raised step or threshold. Trim any bushes or trees on the path to your home. Use bright outdoor lighting. Clear any walking paths of anything that might make someone trip, such as rocks or tools. Regularly check to see if handrails are loose or broken. Make sure that both sides of any steps have  handrails. Any raised decks and porches should have guardrails on the edges. Have any leaves, snow, or ice cleared regularly. Use sand or salt on walking paths during winter. Clean up any spills in your garage right away. This includes oil or grease spills. What can I do in the bathroom? Use night lights. Install grab bars by the toilet and in the tub and shower. Do not use towel bars as grab bars. Use non-skid mats or decals in the tub or shower. If you need to sit down in the shower, use a plastic, non-slip stool. Keep the floor dry. Clean up any water that spills on the floor as soon as it happens. Remove soap buildup in the tub or shower regularly. Attach bath mats securely with double-sided non-slip rug tape. Do not have throw rugs and other things on the floor that can make you trip. What can I do in the bedroom? Use night lights. Make sure that you have a light by your bed that is easy to reach. Do not use any sheets or blankets that are too big for your bed. They should not hang down onto the floor. Have a firm chair that has side arms. You can use this for support while you get dressed.  Do not have throw rugs and other things on the floor that can make you trip. What can I do in the kitchen? Clean up any spills right away. Avoid walking on wet floors. Keep items that you use a lot in easy-to-reach places. If you need to reach something above you, use a strong step stool that has a grab bar. Keep electrical cords out of the way. Do not use floor polish or wax that makes floors slippery. If you must use wax, use non-skid floor wax. Do not have throw rugs and other things on the floor that can make you trip. What can I do with my stairs? Do not leave any items on the stairs. Make sure that there are handrails on both sides of the stairs and use them. Fix handrails that are broken or loose. Make sure that handrails are as long as the stairways. Check any carpeting to make sure  that it is firmly attached to the stairs. Fix any carpet that is loose or worn. Avoid having throw rugs at the top or bottom of the stairs. If you do have throw rugs, attach them to the floor with carpet tape. Make sure that you have a light switch at the top of the stairs and the bottom of the stairs. If you do not have them, ask someone to add them for you. What else can I do to help prevent falls? Wear shoes that: Do not have high heels. Have rubber bottoms. Are comfortable and fit you well. Are closed at the toe. Do not wear sandals. If you use a stepladder: Make sure that it is fully opened. Do not climb a closed stepladder. Make sure that both sides of the stepladder are locked into place. Ask someone to hold it for you, if possible. Clearly mark and make sure that you can see: Any grab bars or handrails. First and last steps. Where the edge of each step is. Use tools that help you move around (mobility aids) if they are needed. These include: Canes. Walkers. Scooters. Crutches. Turn on the lights when you go into a dark area. Replace any light bulbs as soon as they burn out. Set up your furniture so you have a clear path. Avoid moving your furniture around. If any of your floors are uneven, fix them. If there are any pets around you, be aware of where they are. Review your medicines with your doctor. Some medicines can make you feel dizzy. This can increase your chance of falling. Ask your doctor what other things that you can do to help prevent falls. This information is not intended to replace advice given to you by your health care provider. Make sure you discuss any questions you have with your health care provider. Document Released: 11/18/2008 Document Revised: 06/30/2015 Document Reviewed: 02/26/2014 Elsevier Interactive Patient Education  2017 [employer].

## 2022-09-25 NOTE — Progress Notes (Signed)
No fracture or dislocation. No complication of middle finger with plate and screw. Please follow up if symptoms continue.

## 2022-10-01 DIAGNOSIS — F332 Major depressive disorder, recurrent severe without psychotic features: Secondary | ICD-10-CM | POA: Diagnosis not present

## 2022-10-01 DIAGNOSIS — F411 Generalized anxiety disorder: Secondary | ICD-10-CM | POA: Diagnosis not present

## 2022-10-01 DIAGNOSIS — F4323 Adjustment disorder with mixed anxiety and depressed mood: Secondary | ICD-10-CM | POA: Diagnosis not present

## 2022-10-01 DIAGNOSIS — F1120 Opioid dependence, uncomplicated: Secondary | ICD-10-CM | POA: Diagnosis not present

## 2022-10-01 NOTE — Telephone Encounter (Signed)
Patient called back appt made

## 2022-10-01 NOTE — Telephone Encounter (Signed)
Lmtcb.

## 2022-10-04 ENCOUNTER — Other Ambulatory Visit: Payer: Self-pay | Admitting: Family

## 2022-10-04 ENCOUNTER — Ambulatory Visit (INDEPENDENT_AMBULATORY_CARE_PROVIDER_SITE_OTHER): Payer: Medicare HMO

## 2022-10-04 DIAGNOSIS — F4323 Adjustment disorder with mixed anxiety and depressed mood: Secondary | ICD-10-CM | POA: Diagnosis not present

## 2022-10-04 DIAGNOSIS — Z780 Asymptomatic menopausal state: Secondary | ICD-10-CM

## 2022-10-04 DIAGNOSIS — F1120 Opioid dependence, uncomplicated: Secondary | ICD-10-CM | POA: Diagnosis not present

## 2022-10-04 DIAGNOSIS — F332 Major depressive disorder, recurrent severe without psychotic features: Secondary | ICD-10-CM | POA: Diagnosis not present

## 2022-10-04 DIAGNOSIS — Z23 Encounter for immunization: Secondary | ICD-10-CM

## 2022-10-04 DIAGNOSIS — M85851 Other specified disorders of bone density and structure, right thigh: Secondary | ICD-10-CM | POA: Diagnosis not present

## 2022-10-04 DIAGNOSIS — F411 Generalized anxiety disorder: Secondary | ICD-10-CM | POA: Diagnosis not present

## 2022-10-05 ENCOUNTER — Other Ambulatory Visit: Payer: Self-pay | Admitting: Family

## 2022-10-18 DIAGNOSIS — F411 Generalized anxiety disorder: Secondary | ICD-10-CM | POA: Diagnosis not present

## 2022-10-18 DIAGNOSIS — M5432 Sciatica, left side: Secondary | ICD-10-CM | POA: Diagnosis not present

## 2022-10-18 DIAGNOSIS — M961 Postlaminectomy syndrome, not elsewhere classified: Secondary | ICD-10-CM | POA: Diagnosis not present

## 2022-10-18 DIAGNOSIS — F332 Major depressive disorder, recurrent severe without psychotic features: Secondary | ICD-10-CM | POA: Diagnosis not present

## 2022-10-18 DIAGNOSIS — G894 Chronic pain syndrome: Secondary | ICD-10-CM | POA: Diagnosis not present

## 2022-10-18 DIAGNOSIS — T402X5A Adverse effect of other opioids, initial encounter: Secondary | ICD-10-CM | POA: Diagnosis not present

## 2022-10-18 DIAGNOSIS — G8929 Other chronic pain: Secondary | ICD-10-CM | POA: Diagnosis not present

## 2022-10-18 DIAGNOSIS — Z79891 Long term (current) use of opiate analgesic: Secondary | ICD-10-CM | POA: Diagnosis not present

## 2022-10-18 DIAGNOSIS — K5903 Drug induced constipation: Secondary | ICD-10-CM | POA: Diagnosis not present

## 2022-10-18 DIAGNOSIS — F1120 Opioid dependence, uncomplicated: Secondary | ICD-10-CM | POA: Diagnosis not present

## 2022-10-18 DIAGNOSIS — F1190 Opioid use, unspecified, uncomplicated: Secondary | ICD-10-CM | POA: Diagnosis not present

## 2022-10-18 DIAGNOSIS — F4323 Adjustment disorder with mixed anxiety and depressed mood: Secondary | ICD-10-CM | POA: Diagnosis not present

## 2022-10-18 DIAGNOSIS — M25562 Pain in left knee: Secondary | ICD-10-CM | POA: Diagnosis not present

## 2022-11-08 DIAGNOSIS — F4323 Adjustment disorder with mixed anxiety and depressed mood: Secondary | ICD-10-CM | POA: Diagnosis not present

## 2022-11-08 DIAGNOSIS — F411 Generalized anxiety disorder: Secondary | ICD-10-CM | POA: Diagnosis not present

## 2022-11-08 DIAGNOSIS — F332 Major depressive disorder, recurrent severe without psychotic features: Secondary | ICD-10-CM | POA: Diagnosis not present

## 2022-11-08 DIAGNOSIS — F1120 Opioid dependence, uncomplicated: Secondary | ICD-10-CM | POA: Diagnosis not present

## 2022-11-13 ENCOUNTER — Ambulatory Visit: Payer: Medicare HMO | Admitting: Family

## 2022-11-13 ENCOUNTER — Encounter: Payer: Self-pay | Admitting: Family

## 2022-11-13 VITALS — BP 111/75 | HR 88 | Temp 97.7°F | Ht 72.0 in | Wt 202.0 lb

## 2022-11-13 DIAGNOSIS — F331 Major depressive disorder, recurrent, moderate: Secondary | ICD-10-CM

## 2022-11-13 DIAGNOSIS — M25512 Pain in left shoulder: Secondary | ICD-10-CM

## 2022-11-13 DIAGNOSIS — F411 Generalized anxiety disorder: Secondary | ICD-10-CM

## 2022-11-13 DIAGNOSIS — E663 Overweight: Secondary | ICD-10-CM

## 2022-11-13 DIAGNOSIS — E785 Hyperlipidemia, unspecified: Secondary | ICD-10-CM

## 2022-11-13 DIAGNOSIS — K5903 Drug induced constipation: Secondary | ICD-10-CM

## 2022-11-13 DIAGNOSIS — B3731 Acute candidiasis of vulva and vagina: Secondary | ICD-10-CM | POA: Diagnosis not present

## 2022-11-13 DIAGNOSIS — M1712 Unilateral primary osteoarthritis, left knee: Secondary | ICD-10-CM

## 2022-11-13 DIAGNOSIS — Z23 Encounter for immunization: Secondary | ICD-10-CM

## 2022-11-13 DIAGNOSIS — G8929 Other chronic pain: Secondary | ICD-10-CM

## 2022-11-13 LAB — CMP14+EGFR
ALT: 13 [IU]/L (ref 0–32)
AST: 14 [IU]/L (ref 0–40)
Albumin: 4 g/dL (ref 3.9–4.9)
Alkaline Phosphatase: 88 [IU]/L (ref 44–121)
BUN/Creatinine Ratio: 15 (ref 12–28)
BUN: 16 mg/dL (ref 8–27)
Bilirubin Total: 0.5 mg/dL (ref 0.0–1.2)
CO2: 21 mmol/L (ref 20–29)
Calcium: 9 mg/dL (ref 8.7–10.3)
Chloride: 104 mmol/L (ref 96–106)
Creatinine, Ser: 1.08 mg/dL — ABNORMAL HIGH (ref 0.57–1.00)
Globulin, Total: 2.5 g/dL (ref 1.5–4.5)
Glucose: 79 mg/dL (ref 70–99)
Potassium: 4.3 mmol/L (ref 3.5–5.2)
Sodium: 138 mmol/L (ref 134–144)
Total Protein: 6.5 g/dL (ref 6.0–8.5)
eGFR: 57 mL/min/{1.73_m2} — ABNORMAL LOW (ref >59)

## 2022-11-13 MED ORDER — FLUCONAZOLE 150 MG PO TABS
150.0000 mg | ORAL_TABLET | ORAL | 0 refills | Status: DC
Start: 2022-11-13 — End: 2023-01-08

## 2022-11-13 NOTE — Progress Notes (Signed)
Subjective:    Patient ID: Molly Castaneda, female    DOB: Jan 12, 1961, 62 y.o.   MRN: [id number]  Chief Complaint  Patient presents with   Medical Management of Chronic Issues    On penicillin needs diflucan    PT presents to the office today for chronic follow up. Pt is followed by Pain management every month. She is currently taking ms contin. She has chronic left shoulder pain and left sided sciatica pain.    She is followed by Behavioral health every 2 weeks Klonopin and Adderall.  She had dental work completed and was given penicillin. Reports vaginal itching. Arthritis Presents for follow-up visit. She complains of pain and stiffness. Affected locations include the right knee, left knee, left MCP and right MCP. Her pain is at a severity of 5/10. Associated symptoms include fatigue.  Hyperlipidemia This is a chronic problem. The current episode started more than 1 year ago. The problem is uncontrolled. Current antihyperlipidemic treatment includes diet change. The current treatment provides no improvement of lipids. Risk factors for coronary artery disease include hypertension and a sedentary lifestyle.  Anxiety Presents for follow-up visit. Symptoms include excessive worry, nervous/anxious behavior and restlessness. Symptoms occur most days. The severity of symptoms is moderate. The quality of sleep is good.    Depression        This is a chronic problem.  The current episode started more than 1 year ago.   The problem occurs intermittently.  Associated symptoms include fatigue, helplessness, hopelessness, restlessness and sad.  Treatments tried: wellbutrin.  Past medical history includes anxiety.   Constipation This is a chronic problem. The current episode started more than 1 year ago. Her stool frequency is 1 time per week or less. Risk factors include obesity. Treatments tried: movantik. The treatment provided mild relief.  Vaginal Itching The patient's primary symptoms include  genital itching. The patient's pertinent negatives include no genital odor. This is a new problem. The current episode started in the past 7 days. The problem occurs intermittently. Associated symptoms include constipation. The treatment provided mild relief.      Review of Systems  Constitutional:  Positive for fatigue.  Gastrointestinal:  Positive for constipation.  Musculoskeletal:  Positive for arthritis and stiffness.  Psychiatric/Behavioral:  Positive for depression. The patient is nervous/anxious.   All other systems reviewed and are negative.      Objective:   Physical Exam Vitals reviewed.  Constitutional:      General: She is not in acute distress.    Appearance: She is well-developed.  HENT:     Head: Normocephalic and atraumatic.     Right Ear: Tympanic membrane normal.     Left Ear: Tympanic membrane normal.  Eyes:     Pupils: Pupils are equal, round, and reactive to light.  Neck:     Thyroid: No thyromegaly.  Cardiovascular:     Rate and Rhythm: Normal rate and regular rhythm.     Heart sounds: Normal heart sounds. No murmur heard. Pulmonary:     Effort: Pulmonary effort is normal. No respiratory distress.     Breath sounds: Normal breath sounds. No wheezing.  Abdominal:     General: Bowel sounds are normal. There is no distension.     Palpations: Abdomen is soft.     Tenderness: There is no abdominal tenderness.  Musculoskeletal:        General: No tenderness.     Cervical back: Normal range of motion and neck supple.  Right lower leg: Edema (trace) present.     Left lower leg: Edema (trace) present.     Comments: Pain in lumbar with flexion   Skin:    General: Skin is warm and dry.  Neurological:     Mental Status: She is alert and oriented to person, place, and time.     Cranial Nerves: No cranial nerve deficit.     Deep Tendon Reflexes: Reflexes are normal and symmetric.  Psychiatric:        Behavior: Behavior normal.        Thought Content:  Thought content normal.        Judgment: Judgment normal.      BP 111/75   Pulse 88   Temp 97.7 F (36.5 C) (Temporal)   Ht 6' (1.829 m)   Wt 202 lb (91.6 kg)   SpO2 98%   BMI 27.40 kg/m      Assessment & Plan:  Molly Castaneda comes in today with chief complaint of Medical Management of Chronic Issues (On penicillin needs diflucan )   Diagnosis and orders addressed:  1. Encounter for immunization - Flu vaccine trivalent PF, 6mos and older(Flulaval,Afluria,Fluarix,Fluzone) - CMP14+EGFR  2. Drug-induced constipation - CMP14+EGFR  3. Moderate episode of recurrent major depressive disorder (HCC) - CMP14+EGFR  4. GAD (generalized anxiety disorder) - CMP14+EGFR  5. Hyperlipidemia, unspecified hyperlipidemia type - CMP14+EGFR  6. Overweight (BMI 25.0-29.9) - CMP14+EGFR  7. Unilateral primary osteoarthritis, left knee - CMP14+EGFR  8. Chronic left shoulder pain - CMP14+EGFR  9. Vagina, candidiasis - fluconazole (DIFLUCAN) 150 MG tablet; Take 1 tablet (150 mg total) by mouth every 3 (three) hours.  Dispense: 3 tablet; Refill: 0   Labs pending Health Maintenance reviewed Diet and exercise encouraged  Follow up plan: 4 months    [name], FNP

## 2022-11-13 NOTE — Patient Instructions (Signed)
Health Maintenance After Age 65 After age 65, you are at a higher risk for certain long-term diseases and infections as well as injuries from falls. Falls are a major cause of broken bones and head injuries in people who are older than age 65. Getting regular preventive care can help to keep you healthy and well. Preventive care includes getting regular testing and making lifestyle changes as recommended by your health care provider. Talk with your health care provider about: Which screenings and tests you should have. A screening is a test that checks for a disease when you have no symptoms. A diet and exercise plan that is right for you. What should I know about screenings and tests to prevent falls? Screening and testing are the best ways to find a health problem early. Early diagnosis and treatment give you the best chance of managing medical conditions that are common after age 65. Certain conditions and lifestyle choices may make you more likely to have a fall. Your health care provider may recommend: Regular vision checks. Poor vision and conditions such as cataracts can make you more likely to have a fall. If you wear glasses, make sure to get your prescription updated if your vision changes. Medicine review. Work with your health care provider to regularly review all of the medicines you are taking, including over-the-counter medicines. Ask your health care provider about any side effects that may make you more likely to have a fall. Tell your health care provider if any medicines that you take make you feel dizzy or sleepy. Strength and balance checks. Your health care provider may recommend certain tests to check your strength and balance while standing, walking, or changing positions. Foot health exam. Foot pain and numbness, as well as not wearing proper footwear, can make you more likely to have a fall. Screenings, including: Osteoporosis screening. Osteoporosis is a condition that causes  the bones to get weaker and break more easily. Blood pressure screening. Blood pressure changes and medicines to control blood pressure can make you feel dizzy. Depression screening. You may be more likely to have a fall if you have a fear of falling, feel depressed, or feel unable to do activities that you used to do. Alcohol use screening. Using too much alcohol can affect your balance and may make you more likely to have a fall. Follow these instructions at home: Lifestyle Do not drink alcohol if: Your health care provider tells you not to drink. If you drink alcohol: Limit how much you have to: 0-1 drink a day for women. 0-2 drinks a day for men. Know how much alcohol is in your drink. In the [place] one drink equals one 12 oz bottle of beer (355 mL), one 5 oz glass of wine (148 mL), or one 1 oz glass of hard liquor (44 mL). Do not use any products that contain nicotine or tobacco. These products include cigarettes, chewing tobacco, and vaping devices, such as e-cigarettes. If you need help quitting, ask your health care provider. Activity  Follow a regular exercise program to stay fit. This will help you maintain your balance. Ask your health care provider what types of exercise are appropriate for you. If you need a cane or walker, use it as recommended by your health care provider. Wear supportive shoes that have nonskid soles. Safety  Remove any tripping hazards, such as rugs, cords, and clutter. Install safety equipment such as grab bars in bathrooms and safety rails on stairs. Keep rooms and walkways   well-lit. General instructions Talk with your health care provider about your risks for falling. Tell your health care provider if: You fall. Be sure to tell your health care provider about all falls, even ones that seem minor. You feel dizzy, tiredness (fatigue), or off-balance. Take over-the-counter and prescription medicines only as told by your health care provider. These include  supplements. Eat a healthy diet and maintain a healthy weight. A healthy diet includes low-fat dairy products, low-fat (lean) meats, and fiber from whole grains, beans, and lots of fruits and vegetables. Stay current with your vaccines. Schedule regular health, dental, and eye exams. Summary Having a healthy lifestyle and getting preventive care can help to protect your health and wellness after age 65. Screening and testing are the best way to find a health problem early and help you avoid having a fall. Early diagnosis and treatment give you the best chance for managing medical conditions that are more common for people who are older than age 65. Falls are a major cause of broken bones and head injuries in people who are older than age 65. Take precautions to prevent a fall at home. Work with your health care provider to learn what changes you can make to improve your health and wellness and to prevent falls. This information is not intended to replace advice given to you by your health care provider. Make sure you discuss any questions you have with your health care provider. Document Revised: 06/13/2020 Document Reviewed: 06/13/2020 Elsevier Patient Education  2024 [employer]  

## 2022-11-16 DIAGNOSIS — G894 Chronic pain syndrome: Secondary | ICD-10-CM | POA: Diagnosis not present

## 2022-11-16 DIAGNOSIS — M25562 Pain in left knee: Secondary | ICD-10-CM | POA: Diagnosis not present

## 2022-11-16 DIAGNOSIS — K5903 Drug induced constipation: Secondary | ICD-10-CM | POA: Diagnosis not present

## 2022-11-16 DIAGNOSIS — M961 Postlaminectomy syndrome, not elsewhere classified: Secondary | ICD-10-CM | POA: Diagnosis not present

## 2022-11-16 DIAGNOSIS — Z79891 Long term (current) use of opiate analgesic: Secondary | ICD-10-CM | POA: Diagnosis not present

## 2022-11-16 DIAGNOSIS — M5432 Sciatica, left side: Secondary | ICD-10-CM | POA: Diagnosis not present

## 2022-11-16 DIAGNOSIS — G8929 Other chronic pain: Secondary | ICD-10-CM | POA: Diagnosis not present

## 2022-11-16 DIAGNOSIS — T402X5A Adverse effect of other opioids, initial encounter: Secondary | ICD-10-CM | POA: Diagnosis not present

## 2022-11-16 DIAGNOSIS — F1190 Opioid use, unspecified, uncomplicated: Secondary | ICD-10-CM | POA: Diagnosis not present

## 2022-11-22 DIAGNOSIS — F411 Generalized anxiety disorder: Secondary | ICD-10-CM | POA: Diagnosis not present

## 2022-11-22 DIAGNOSIS — F1120 Opioid dependence, uncomplicated: Secondary | ICD-10-CM | POA: Diagnosis not present

## 2022-11-22 DIAGNOSIS — F332 Major depressive disorder, recurrent severe without psychotic features: Secondary | ICD-10-CM | POA: Diagnosis not present

## 2022-11-22 DIAGNOSIS — F4323 Adjustment disorder with mixed anxiety and depressed mood: Secondary | ICD-10-CM | POA: Diagnosis not present

## 2022-11-26 DIAGNOSIS — F332 Major depressive disorder, recurrent severe without psychotic features: Secondary | ICD-10-CM | POA: Diagnosis not present

## 2022-11-26 DIAGNOSIS — F1120 Opioid dependence, uncomplicated: Secondary | ICD-10-CM | POA: Diagnosis not present

## 2022-11-26 DIAGNOSIS — F4323 Adjustment disorder with mixed anxiety and depressed mood: Secondary | ICD-10-CM | POA: Diagnosis not present

## 2022-11-26 DIAGNOSIS — F411 Generalized anxiety disorder: Secondary | ICD-10-CM | POA: Diagnosis not present

## 2022-12-06 DIAGNOSIS — F1120 Opioid dependence, uncomplicated: Secondary | ICD-10-CM | POA: Diagnosis not present

## 2022-12-06 DIAGNOSIS — F4323 Adjustment disorder with mixed anxiety and depressed mood: Secondary | ICD-10-CM | POA: Diagnosis not present

## 2022-12-06 DIAGNOSIS — F411 Generalized anxiety disorder: Secondary | ICD-10-CM | POA: Diagnosis not present

## 2022-12-06 DIAGNOSIS — F332 Major depressive disorder, recurrent severe without psychotic features: Secondary | ICD-10-CM | POA: Diagnosis not present

## 2022-12-20 DIAGNOSIS — F332 Major depressive disorder, recurrent severe without psychotic features: Secondary | ICD-10-CM | POA: Diagnosis not present

## 2022-12-20 DIAGNOSIS — F1120 Opioid dependence, uncomplicated: Secondary | ICD-10-CM | POA: Diagnosis not present

## 2022-12-20 DIAGNOSIS — F4323 Adjustment disorder with mixed anxiety and depressed mood: Secondary | ICD-10-CM | POA: Diagnosis not present

## 2022-12-20 DIAGNOSIS — F411 Generalized anxiety disorder: Secondary | ICD-10-CM | POA: Diagnosis not present

## 2022-12-21 ENCOUNTER — Other Ambulatory Visit: Payer: Self-pay | Admitting: Family

## 2022-12-21 DIAGNOSIS — M25562 Pain in left knee: Secondary | ICD-10-CM | POA: Diagnosis not present

## 2022-12-21 DIAGNOSIS — Z1231 Encounter for screening mammogram for malignant neoplasm of breast: Secondary | ICD-10-CM

## 2022-12-21 DIAGNOSIS — G894 Chronic pain syndrome: Secondary | ICD-10-CM | POA: Diagnosis not present

## 2022-12-21 DIAGNOSIS — M25561 Pain in right knee: Secondary | ICD-10-CM | POA: Diagnosis not present

## 2022-12-21 DIAGNOSIS — M961 Postlaminectomy syndrome, not elsewhere classified: Secondary | ICD-10-CM | POA: Diagnosis not present

## 2022-12-21 DIAGNOSIS — T402X5A Adverse effect of other opioids, initial encounter: Secondary | ICD-10-CM | POA: Diagnosis not present

## 2022-12-21 DIAGNOSIS — K5903 Drug induced constipation: Secondary | ICD-10-CM | POA: Diagnosis not present

## 2022-12-21 DIAGNOSIS — M5432 Sciatica, left side: Secondary | ICD-10-CM | POA: Diagnosis not present

## 2022-12-21 DIAGNOSIS — F1190 Opioid use, unspecified, uncomplicated: Secondary | ICD-10-CM | POA: Diagnosis not present

## 2022-12-28 ENCOUNTER — Encounter: Payer: Self-pay | Admitting: Family Medicine

## 2022-12-28 ENCOUNTER — Ambulatory Visit (INDEPENDENT_AMBULATORY_CARE_PROVIDER_SITE_OTHER): Payer: Medicare HMO | Admitting: Family Medicine

## 2022-12-28 VITALS — BP 110/65 | HR 95 | Temp 98.7°F | Ht 72.0 in | Wt 202.0 lb

## 2022-12-28 DIAGNOSIS — J069 Acute upper respiratory infection, unspecified: Secondary | ICD-10-CM | POA: Diagnosis not present

## 2022-12-28 DIAGNOSIS — F411 Generalized anxiety disorder: Secondary | ICD-10-CM

## 2022-12-28 DIAGNOSIS — F331 Major depressive disorder, recurrent, moderate: Secondary | ICD-10-CM
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
## 2023-01-02 DIAGNOSIS — F1120 Opioid dependence, uncomplicated: Secondary | ICD-10-CM | POA: Diagnosis not present

## 2023-01-02 DIAGNOSIS — F411 Generalized anxiety disorder: Secondary | ICD-10-CM | POA: Diagnosis not present

## 2023-01-02 DIAGNOSIS — F4323 Adjustment disorder with mixed anxiety and depressed mood: Secondary | ICD-10-CM | POA: Diagnosis not present

## 2023-01-02 DIAGNOSIS — F332 Major depressive disorder, recurrent severe without psychotic features: Secondary | ICD-10-CM | POA: Diagnosis not present

## 2023-01-08 ENCOUNTER — Telehealth: Payer: Self-pay | Admitting: Family Medicine

## 2023-01-08 DIAGNOSIS — R051 Acute cough: Secondary | ICD-10-CM

## 2023-01-08 MED ORDER — AMOXICILLIN-POT CLAVULANATE 875-125 MG PO TABS: 1.0000 | ORAL_TABLET | Freq: Two times a day (BID) | ORAL | 0 refills | Status: AC

## 2023-01-08 MED ORDER — FLUCONAZOLE 150 MG PO TABS: 150.0000 mg | ORAL_TABLET | Freq: Once | ORAL | 0 refills | Status: AC

## 2023-01-08 NOTE — Telephone Encounter (Signed)
Patient aware and verbalized understanding. °

## 2023-01-08 NOTE — Telephone Encounter (Signed)
Patient calling because she is better from visit 11/22. Still has the cough and congestion and stuffy nose. She done all the recommendation provider told her to do. Please advise. Patient is asking for something to please be sent in to [facility]

## 2023-01-08 NOTE — Addendum Note (Signed)
Addended by: Neale Burly on: 01/08/2023 02:43 PM   Modules accepted: Orders

## 2023-01-08 NOTE — Telephone Encounter (Signed)
Copied from CRM [id number]. Topic: Clinical - Prescription Issue >> Jan 08, 2023 12:57 PM Deaijah H wrote: Reason for CRM: Patient stated she gets yeast infections when taking antibiotics and that Dr. Nickola Major prescribed a pill for the infection

## 2023-01-08 NOTE — Telephone Encounter (Signed)
Copied from CRM [id number]. Topic: Clinical - Medical Advice >> Jan 08, 2023 10:46 AM Deaijah H wrote: Reason for CRM: Patient would like Christy nurse to call due to same symptoms from last visit / callback # [phone]

## 2023-01-10 DIAGNOSIS — M79605 Pain in left leg: Secondary | ICD-10-CM | POA: Diagnosis not present

## 2023-01-10 DIAGNOSIS — M545 Low back pain, unspecified: Secondary | ICD-10-CM | POA: Diagnosis not present

## 2023-01-10 DIAGNOSIS — K5903 Drug induced constipation: Secondary | ICD-10-CM | POA: Diagnosis not present

## 2023-01-10 DIAGNOSIS — T402X5A Adverse effect of other opioids, initial encounter: Secondary | ICD-10-CM | POA: Diagnosis not present

## 2023-01-10 DIAGNOSIS — F1190 Opioid use, unspecified, uncomplicated: Secondary | ICD-10-CM | POA: Diagnosis not present

## 2023-01-10 DIAGNOSIS — Z79891 Long term (current) use of opiate analgesic: Secondary | ICD-10-CM | POA: Diagnosis not present

## 2023-01-10 DIAGNOSIS — M5416 Radiculopathy, lumbar region: Secondary | ICD-10-CM | POA: Diagnosis not present

## 2023-01-10 DIAGNOSIS — M961 Postlaminectomy syndrome, not elsewhere classified: Secondary | ICD-10-CM | POA: Diagnosis not present

## 2023-01-10 DIAGNOSIS — G894 Chronic pain syndrome: Secondary | ICD-10-CM | POA: Diagnosis not present

## 2023-01-16 DIAGNOSIS — F332 Major depressive disorder, recurrent severe without psychotic features: Secondary | ICD-10-CM | POA: Diagnosis not present

## 2023-01-16 DIAGNOSIS — F1120 Opioid dependence, uncomplicated: Secondary | ICD-10-CM | POA: Diagnosis not present

## 2023-01-16 DIAGNOSIS — F4323 Adjustment disorder with mixed anxiety and depressed mood: Secondary | ICD-10-CM | POA: Diagnosis not present

## 2023-01-16 DIAGNOSIS — F411 Generalized anxiety disorder: Secondary | ICD-10-CM | POA: Diagnosis not present

## 2023-01-22 DIAGNOSIS — I70203 Unspecified atherosclerosis of native arteries of extremities, bilateral legs: Secondary | ICD-10-CM | POA: Diagnosis not present

## 2023-01-22 DIAGNOSIS — L84 Corns and callosities: Secondary | ICD-10-CM | POA: Diagnosis not present

## 2023-01-22 DIAGNOSIS — M79676 Pain in unspecified toe(s): Secondary | ICD-10-CM | POA: Diagnosis not present

## 2023-01-22 DIAGNOSIS — B351 Tinea unguium: Secondary | ICD-10-CM | POA: Diagnosis not present

## 2023-02-04 ENCOUNTER — Ambulatory Visit
Admission: RE | Admit: 2023-02-04 | Discharge: 2023-02-04 | Disposition: A | Discharge status: Home / Self Care | Payer: Medicare HMO | Source: Ambulatory Visit | Attending: Family | Admitting: Family

## 2023-02-04 DIAGNOSIS — Z1231 Encounter for screening mammogram for malignant neoplasm of breast: Secondary | ICD-10-CM

## 2023-02-14 DIAGNOSIS — F4323 Adjustment disorder with mixed anxiety and depressed mood: Secondary | ICD-10-CM | POA: Diagnosis not present

## 2023-02-14 DIAGNOSIS — F411 Generalized anxiety disorder: Secondary | ICD-10-CM | POA: Diagnosis not present

## 2023-02-14 DIAGNOSIS — F332 Major depressive disorder, recurrent severe without psychotic features: Secondary | ICD-10-CM | POA: Diagnosis not present

## 2023-02-14 DIAGNOSIS — F1120 Opioid dependence, uncomplicated: Secondary | ICD-10-CM | POA: Diagnosis not present

## 2023-02-19 DIAGNOSIS — K5903 Drug induced constipation: Secondary | ICD-10-CM | POA: Diagnosis not present

## 2023-02-19 DIAGNOSIS — M961 Postlaminectomy syndrome, not elsewhere classified: Secondary | ICD-10-CM | POA: Diagnosis not present

## 2023-02-19 DIAGNOSIS — M545 Low back pain, unspecified: Secondary | ICD-10-CM | POA: Diagnosis not present

## 2023-02-19 DIAGNOSIS — T402X5A Adverse effect of other opioids, initial encounter: Secondary | ICD-10-CM | POA: Diagnosis not present

## 2023-02-19 DIAGNOSIS — Z79891 Long term (current) use of opiate analgesic: Secondary | ICD-10-CM | POA: Diagnosis not present

## 2023-02-19 DIAGNOSIS — F1190 Opioid use, unspecified, uncomplicated: Secondary | ICD-10-CM | POA: Diagnosis not present

## 2023-02-19 DIAGNOSIS — G8929 Other chronic pain: Secondary | ICD-10-CM | POA: Diagnosis not present

## 2023-02-19 DIAGNOSIS — G894 Chronic pain syndrome: Secondary | ICD-10-CM | POA: Diagnosis not present

## 2023-02-19 DIAGNOSIS — M25572 Pain in left ankle and joints of left foot: Secondary | ICD-10-CM | POA: Diagnosis not present

## 2023-02-28 DIAGNOSIS — F4323 Adjustment disorder with mixed anxiety and depressed mood: Secondary | ICD-10-CM | POA: Diagnosis not present

## 2023-02-28 DIAGNOSIS — F332 Major depressive disorder, recurrent severe without psychotic features: Secondary | ICD-10-CM | POA: Diagnosis not present

## 2023-02-28 DIAGNOSIS — F1120 Opioid dependence, uncomplicated: Secondary | ICD-10-CM | POA: Diagnosis not present

## 2023-02-28 DIAGNOSIS — F411 Generalized anxiety disorder: Secondary | ICD-10-CM | POA: Diagnosis not present

## 2023-03-04 DIAGNOSIS — F4323 Adjustment disorder with mixed anxiety and depressed mood: Secondary | ICD-10-CM | POA: Diagnosis not present

## 2023-03-04 DIAGNOSIS — F1120 Opioid dependence, uncomplicated: Secondary | ICD-10-CM | POA: Diagnosis not present

## 2023-03-04 DIAGNOSIS — F332 Major depressive disorder, recurrent severe without psychotic features: Secondary | ICD-10-CM | POA: Diagnosis not present

## 2023-03-04 DIAGNOSIS — F411 Generalized anxiety disorder: Secondary | ICD-10-CM | POA: Diagnosis not present

## 2023-03-14 DIAGNOSIS — F4323 Adjustment disorder with mixed anxiety and depressed mood: Secondary | ICD-10-CM | POA: Diagnosis not present

## 2023-03-14 DIAGNOSIS — F1120 Opioid dependence, uncomplicated: Secondary | ICD-10-CM | POA: Diagnosis not present

## 2023-03-14 DIAGNOSIS — F332 Major depressive disorder, recurrent severe without psychotic features: Secondary | ICD-10-CM | POA: Diagnosis not present

## 2023-03-14 DIAGNOSIS — F411 Generalized anxiety disorder: Secondary | ICD-10-CM | POA: Diagnosis not present

## 2023-04-03 IMAGING — MG MM DIGITAL SCREENING BILAT W/ TOMO AND CAD
8 series · 8 of 24 positions shown · non-contrast
Comparison: Previous exam(s).

CLINICAL DATA: Screening.

EXAM:
DIGITAL SCREENING BILATERAL MAMMOGRAM WITH TOMOSYNTHESIS AND CAD
TECHNIQUE: Bilateral screening digital craniocaudal and mediolateral oblique
mammograms were obtained. Bilateral screening digital breast
tomosynthesis was performed. The images were evaluated with
computer-aided detection.

[R CC synth-2D]
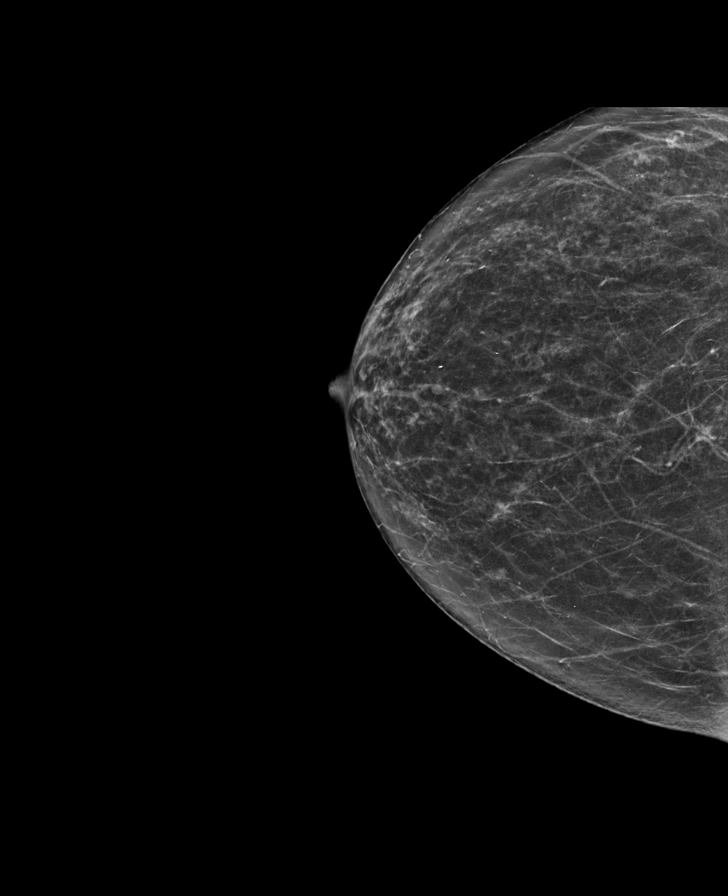

[R MLO synth-2D]
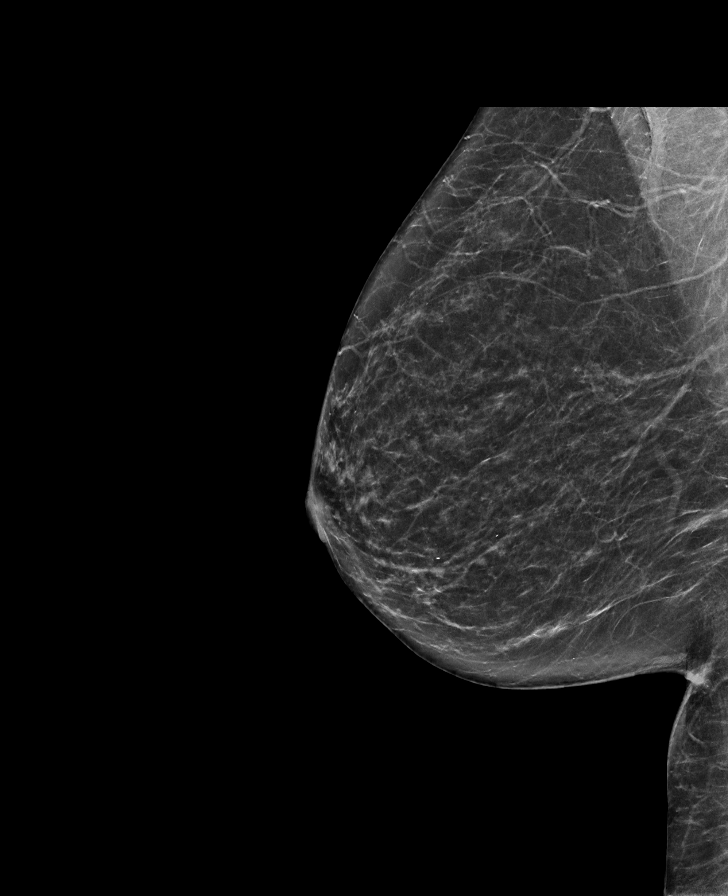

[L MLO synth-2D]
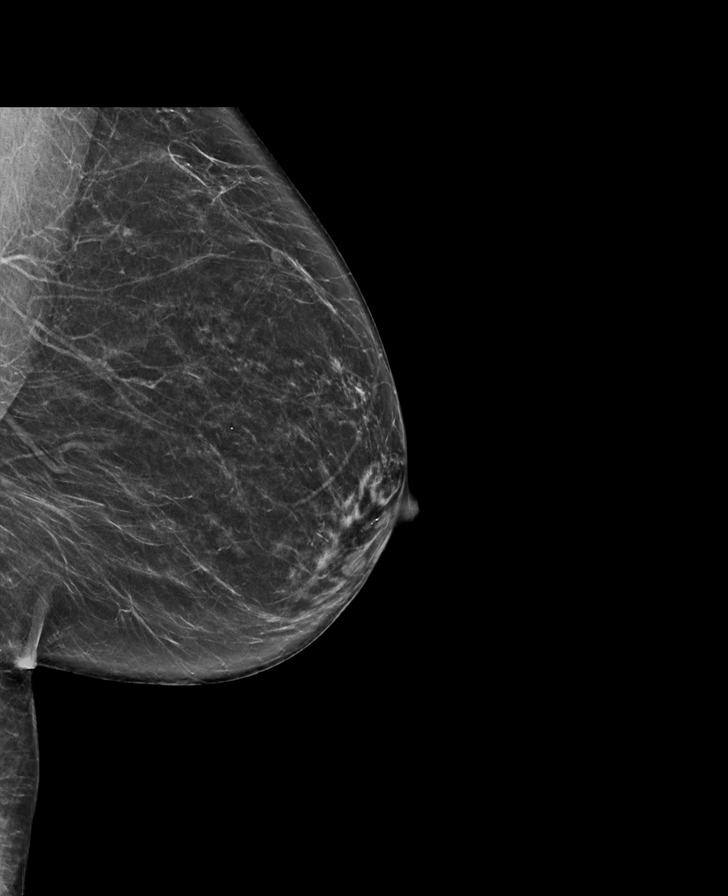

[L CC synth-2D]
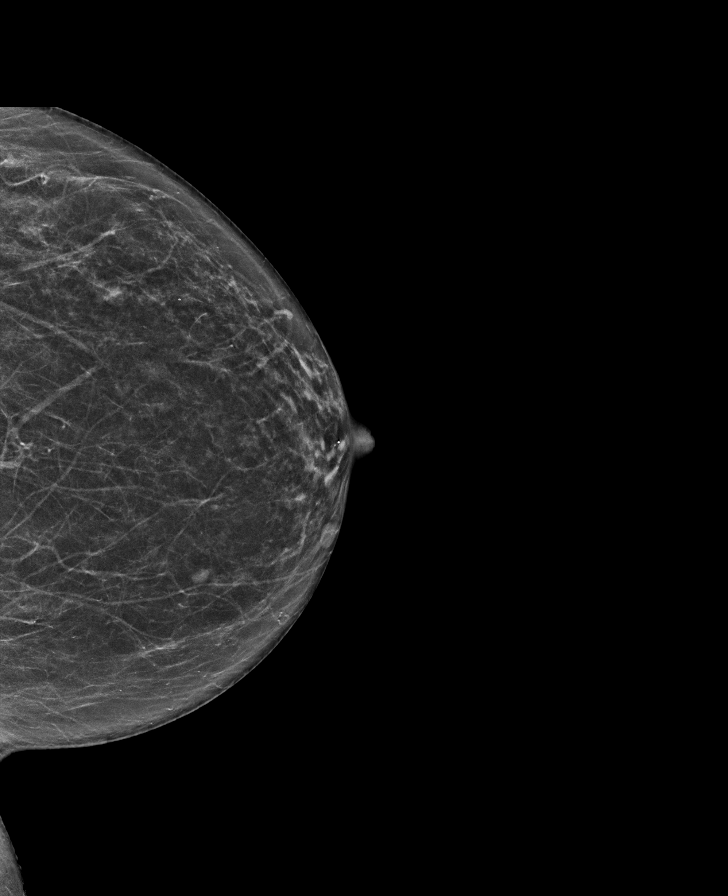

[L CC tomo · tomo slice 31/62.0]
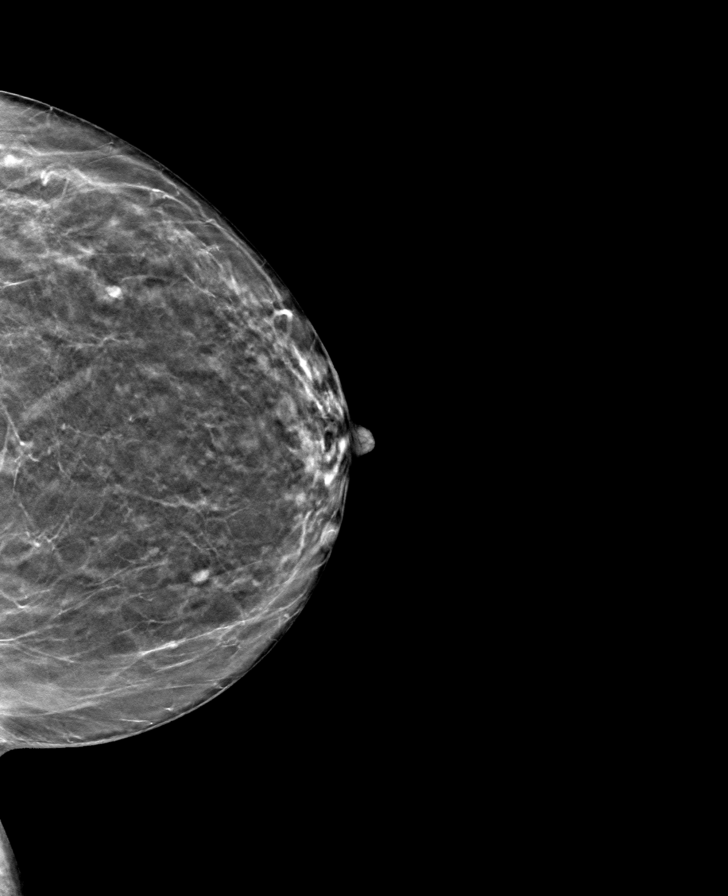

[R MLO tomo · tomo slice 35/69.0]
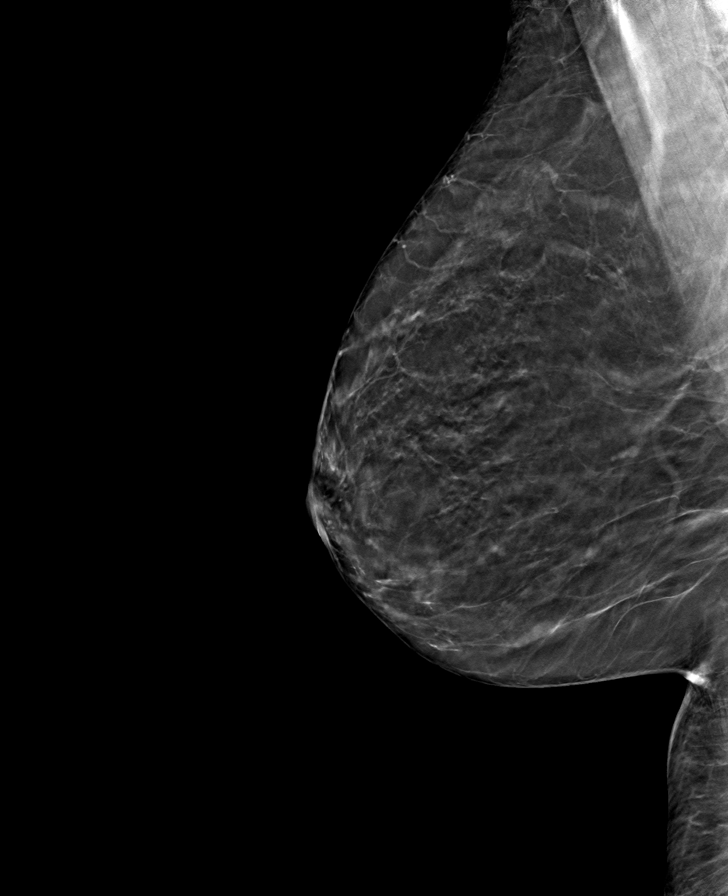

[L MLO tomo · tomo slice 35/69.0]
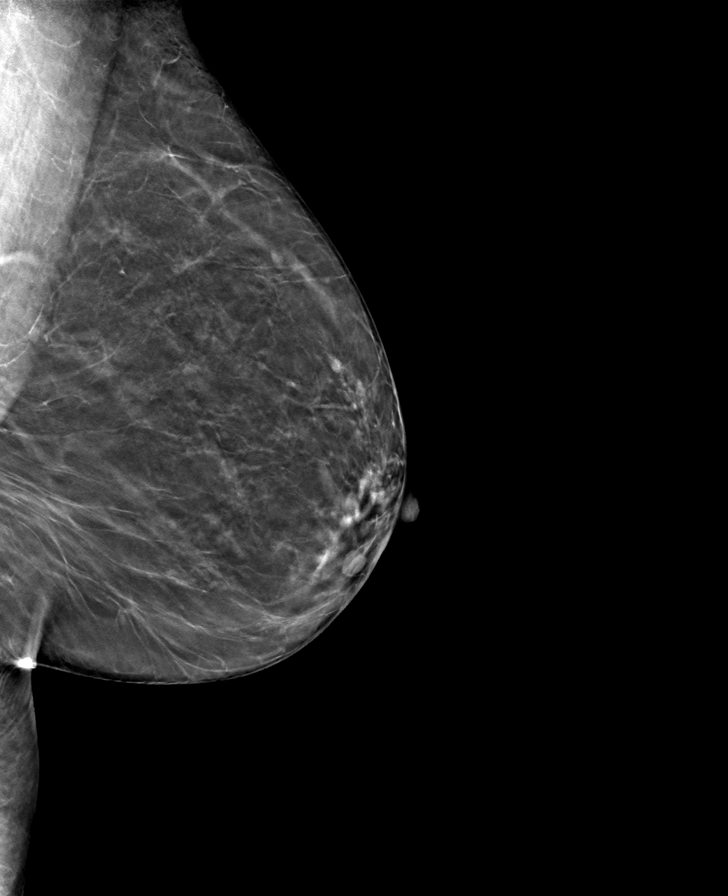

[R CC tomo · tomo slice 33/65.0]
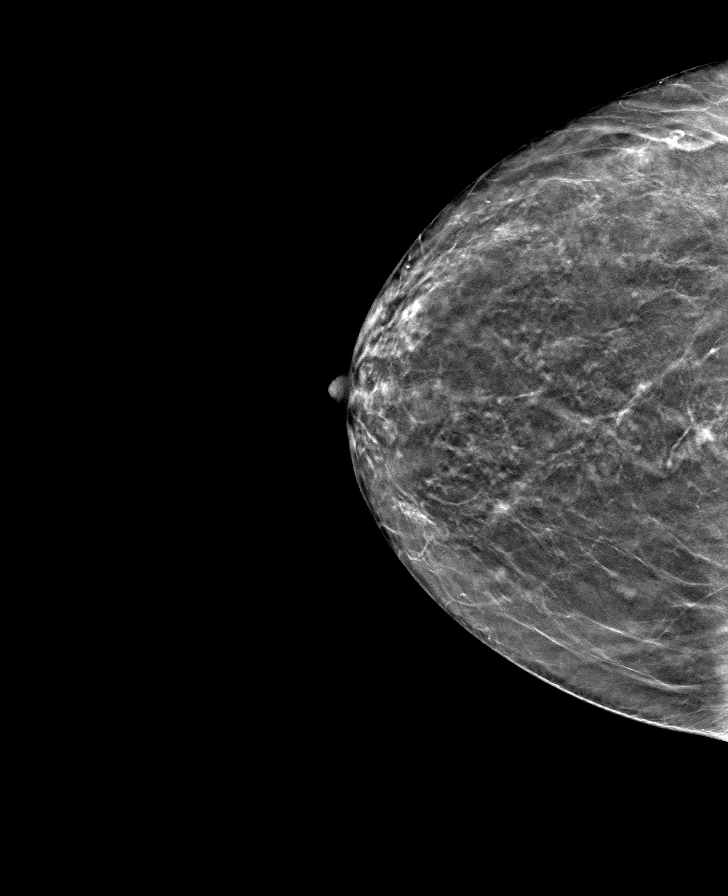

[8 of 24 positions shown; findings below may reference images not displayed]

ACR Breast Density Category b: There are scattered areas of
fibroglandular density.
FINDINGS: There are no findings suspicious for malignancy.
IMPRESSION: No mammographic evidence of malignancy. A result letter of this
screening mammogram will be mailed directly to the patient.

RECOMMENDATION:
Screening mammogram in one year. (Code:51-O-LD2)

BI-RADS CATEGORY  1: Negative.

## 2023-04-11 DIAGNOSIS — F1120 Opioid dependence, uncomplicated: Secondary | ICD-10-CM | POA: Diagnosis not present

## 2023-04-11 DIAGNOSIS — F411 Generalized anxiety disorder: Secondary | ICD-10-CM | POA: Diagnosis not present

## 2023-04-11 DIAGNOSIS — F332 Major depressive disorder, recurrent severe without psychotic features: Secondary | ICD-10-CM | POA: Diagnosis not present

## 2023-04-11 DIAGNOSIS — F4323 Adjustment disorder with mixed anxiety and depressed mood: Secondary | ICD-10-CM | POA: Diagnosis not present

## 2023-04-17 DIAGNOSIS — F1120 Opioid dependence, uncomplicated: Secondary | ICD-10-CM | POA: Diagnosis not present

## 2023-04-17 DIAGNOSIS — F4323 Adjustment disorder with mixed anxiety and depressed mood: Secondary | ICD-10-CM | POA: Diagnosis not present

## 2023-04-17 DIAGNOSIS — F332 Major depressive disorder, recurrent severe without psychotic features: Secondary | ICD-10-CM | POA: Diagnosis not present

## 2023-04-17 DIAGNOSIS — F411 Generalized anxiety disorder: Secondary | ICD-10-CM | POA: Diagnosis not present

## 2023-04-19 DIAGNOSIS — M5416 Radiculopathy, lumbar region: Secondary | ICD-10-CM | POA: Diagnosis not present

## 2023-04-19 DIAGNOSIS — M79605 Pain in left leg: Secondary | ICD-10-CM | POA: Diagnosis not present

## 2023-04-19 DIAGNOSIS — G894 Chronic pain syndrome: Secondary | ICD-10-CM | POA: Diagnosis not present

## 2023-04-19 DIAGNOSIS — G8929 Other chronic pain: Secondary | ICD-10-CM | POA: Diagnosis not present

## 2023-04-19 DIAGNOSIS — M545 Low back pain, unspecified: Secondary | ICD-10-CM | POA: Diagnosis not present

## 2023-04-19 DIAGNOSIS — M961 Postlaminectomy syndrome, not elsewhere classified: Secondary | ICD-10-CM | POA: Diagnosis not present

## 2023-04-19 DIAGNOSIS — M25561 Pain in right knee: Secondary | ICD-10-CM | POA: Diagnosis not present

## 2023-04-19 DIAGNOSIS — F1190 Opioid use, unspecified, uncomplicated: Secondary | ICD-10-CM | POA: Diagnosis not present

## 2023-04-19 DIAGNOSIS — Z79891 Long term (current) use of opiate analgesic: Secondary | ICD-10-CM | POA: Diagnosis not present

## 2023-04-25 DIAGNOSIS — F332 Major depressive disorder, recurrent severe without psychotic features: Secondary | ICD-10-CM | POA: Diagnosis not present

## 2023-04-25 DIAGNOSIS — F1120 Opioid dependence, uncomplicated: Secondary | ICD-10-CM | POA: Diagnosis not present

## 2023-04-25 DIAGNOSIS — F411 Generalized anxiety disorder: Secondary | ICD-10-CM | POA: Diagnosis not present

## 2023-04-25 DIAGNOSIS — F4323 Adjustment disorder with mixed anxiety and depressed mood: Secondary | ICD-10-CM | POA: Diagnosis not present

## 2023-05-07 ENCOUNTER — Encounter: Payer: Self-pay | Admitting: Family Medicine

## 2023-05-14 ENCOUNTER — Ambulatory Visit: Payer: Medicare HMO | Admitting: Family

## 2023-05-14 ENCOUNTER — Encounter: Payer: Self-pay | Admitting: Family

## 2023-05-14 VITALS — BP 124/71 | HR 87 | Temp 97.3°F | Ht 72.0 in | Wt 202.8 lb

## 2023-05-14 DIAGNOSIS — K5903 Drug induced constipation: Secondary | ICD-10-CM

## 2023-05-14 DIAGNOSIS — E663 Overweight: Secondary | ICD-10-CM

## 2023-05-14 DIAGNOSIS — Z0001 Encounter for general adult medical examination with abnormal findings: Secondary | ICD-10-CM | POA: Diagnosis not present

## 2023-05-14 DIAGNOSIS — E785 Hyperlipidemia, unspecified: Secondary | ICD-10-CM | POA: Diagnosis not present

## 2023-05-14 DIAGNOSIS — F331 Major depressive disorder, recurrent, moderate: Secondary | ICD-10-CM | POA: Diagnosis not present

## 2023-05-14 DIAGNOSIS — Z6827 Body mass index (BMI) 27.0-27.9, adult: Secondary | ICD-10-CM

## 2023-05-14 DIAGNOSIS — E559 Vitamin D deficiency, unspecified: Secondary | ICD-10-CM | POA: Diagnosis not present

## 2023-05-14 DIAGNOSIS — F411 Generalized anxiety disorder: Secondary | ICD-10-CM

## 2023-05-14 DIAGNOSIS — Z0000 Encounter for general adult medical examination without abnormal findings: Secondary | ICD-10-CM

## 2023-05-14 NOTE — Patient Instructions (Signed)
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
## 2023-05-15 LAB — CBC WITH DIFFERENTIAL/PLATELET
Basophils Absolute: 0 10*3/uL (ref 0.0–0.2)
Basos: 1 %
EOS (ABSOLUTE): 0.2 10*3/uL (ref 0.0–0.4)
Eos: 3 %
Hematocrit: 39.7 % (ref 34.0–46.6)
Hemoglobin: 12.4 g/dL (ref 11.1–15.9)
Immature Grans (Abs): 0 10*3/uL (ref 0.0–0.1)
Immature Granulocytes: 0 %
Lymphocytes Absolute: 1.7 10*3/uL (ref 0.7–3.1)
Lymphs: 33 %
MCH: 27.1 pg (ref 26.6–33.0)
MCHC: 31.2 g/dL — ABNORMAL LOW (ref 31.5–35.7)
MCV: 87 fL (ref 79–97)
Monocytes Absolute: 0.5 10*3/uL (ref 0.1–0.9)
Monocytes: 10 %
Neutrophils Absolute: 2.7 10*3/uL (ref 1.4–7.0)
Neutrophils: 53 %
Platelets: 288 10*3/uL (ref 150–450)
RBC: 4.58 x10E6/uL (ref 3.77–5.28)
RDW: 12.8 % (ref 11.7–15.4)
WBC: 5.1 10*3/uL (ref 3.4–10.8)

## 2023-05-15 LAB — TSH: TSH: 3.92 u[IU]/mL (ref 0.450–4.500)

## 2023-05-15 LAB — LIPID PANEL
Chol/HDL Ratio: 2.8 ratio (ref 0.0–4.4)
Cholesterol, Total: 188 mg/dL (ref 100–199)
HDL: 68 mg/dL (ref >39)
LDL Chol Calc (NIH): 102 mg/dL — ABNORMAL HIGH (ref 0–99)
Triglycerides: 100 mg/dL (ref 0–149)
VLDL Cholesterol Cal: 18 mg/dL (ref 5–40)

## 2023-05-15 LAB — VITAMIN D 25 HYDROXY (VIT D DEFICIENCY, FRACTURES): Vit D, 25-Hydroxy: 41.8 ng/mL (ref 30.0–100.0)

## 2023-05-15 LAB — CMP14+EGFR
ALT: 14 IU/L (ref 0–32)
AST: 17 IU/L (ref 0–40)
Albumin: 4 g/dL (ref 3.9–4.9)
Alkaline Phosphatase: 79 IU/L (ref 44–121)
BUN/Creatinine Ratio: 13 (ref 12–28)
BUN: 12 mg/dL (ref 8–27)
Bilirubin Total: 0.4 mg/dL (ref 0.0–1.2)
CO2: 20 mmol/L (ref 20–29)
Calcium: 8.7 mg/dL (ref 8.7–10.3)
Chloride: 107 mmol/L — ABNORMAL HIGH (ref 96–106)
Creatinine, Ser: 0.96 mg/dL (ref 0.57–1.00)
Globulin, Total: 2.4 g/dL (ref 1.5–4.5)
Glucose: 75 mg/dL (ref 70–99)
Potassium: 3.7 mmol/L (ref 3.5–5.2)
Sodium: 141 mmol/L (ref 134–144)
Total Protein: 6.4 g/dL (ref 6.0–8.5)
eGFR: 66 mL/min/{1.73_m2} (ref >59)

## 2023-05-19 ENCOUNTER — Other Ambulatory Visit: Payer: Self-pay | Admitting: Family

## 2023-05-19 DIAGNOSIS — J301 Allergic rhinitis due to pollen: Secondary | ICD-10-CM

## 2023-05-22 DIAGNOSIS — M5136 Other intervertebral disc degeneration, lumbar region with discogenic back pain only: Secondary | ICD-10-CM | POA: Diagnosis not present

## 2023-05-22 DIAGNOSIS — M5137 Other intervertebral disc degeneration, lumbosacral region with discogenic back pain only: Secondary | ICD-10-CM | POA: Diagnosis not present

## 2023-05-22 DIAGNOSIS — G894 Chronic pain syndrome: Secondary | ICD-10-CM | POA: Diagnosis not present

## 2023-05-22 DIAGNOSIS — K5903 Drug induced constipation: Secondary | ICD-10-CM | POA: Diagnosis not present

## 2023-05-22 DIAGNOSIS — M961 Postlaminectomy syndrome, not elsewhere classified: Secondary | ICD-10-CM | POA: Diagnosis not present

## 2023-05-22 DIAGNOSIS — M47817 Spondylosis without myelopathy or radiculopathy, lumbosacral region: Secondary | ICD-10-CM | POA: Diagnosis not present

## 2023-05-22 DIAGNOSIS — Z981 Arthrodesis status: Secondary | ICD-10-CM | POA: Diagnosis not present

## 2023-05-22 DIAGNOSIS — T402X5A Adverse effect of other opioids, initial encounter: Secondary | ICD-10-CM | POA: Diagnosis not present

## 2023-05-22 DIAGNOSIS — M4316 Spondylolisthesis, lumbar region: Secondary | ICD-10-CM | POA: Diagnosis not present

## 2023-05-22 DIAGNOSIS — M47816 Spondylosis without myelopathy or radiculopathy, lumbar region: Secondary | ICD-10-CM | POA: Diagnosis not present

## 2023-05-23 DIAGNOSIS — F1120 Opioid dependence, uncomplicated: Secondary | ICD-10-CM | POA: Diagnosis not present

## 2023-05-23 DIAGNOSIS — F411 Generalized anxiety disorder: Secondary | ICD-10-CM | POA: Diagnosis not present

## 2023-05-23 DIAGNOSIS — F332 Major depressive disorder, recurrent severe without psychotic features: Secondary | ICD-10-CM | POA: Diagnosis not present

## 2023-05-23 DIAGNOSIS — F4323 Adjustment disorder with mixed anxiety and depressed mood: Secondary | ICD-10-CM | POA: Diagnosis not present

## 2023-06-20 DIAGNOSIS — F411 Generalized anxiety disorder: Secondary | ICD-10-CM | POA: Diagnosis not present

## 2023-06-20 DIAGNOSIS — F339 Major depressive disorder, recurrent, unspecified: Secondary | ICD-10-CM | POA: Diagnosis not present

## 2023-07-03 DIAGNOSIS — F411 Generalized anxiety disorder: Secondary | ICD-10-CM | POA: Diagnosis not present

## 2023-07-18 DIAGNOSIS — F339 Major depressive disorder, recurrent, unspecified: Secondary | ICD-10-CM | POA: Diagnosis not present

## 2023-07-18 DIAGNOSIS — F4310 Post-traumatic stress disorder, unspecified: Secondary | ICD-10-CM | POA: Diagnosis not present

## 2023-07-18 DIAGNOSIS — F411 Generalized anxiety disorder: Secondary | ICD-10-CM | POA: Diagnosis not present

## 2023-07-18 DIAGNOSIS — F320 Major depressive disorder, single episode, mild: Secondary | ICD-10-CM | POA: Diagnosis not present

## 2023-07-19 DIAGNOSIS — F1190 Opioid use, unspecified, uncomplicated: Secondary | ICD-10-CM | POA: Diagnosis not present

## 2023-07-19 DIAGNOSIS — M961 Postlaminectomy syndrome, not elsewhere classified: Secondary | ICD-10-CM | POA: Diagnosis not present

## 2023-07-19 DIAGNOSIS — K5903 Drug induced constipation: Secondary | ICD-10-CM | POA: Diagnosis not present

## 2023-07-19 DIAGNOSIS — M5416 Radiculopathy, lumbar region: Secondary | ICD-10-CM | POA: Diagnosis not present

## 2023-07-19 DIAGNOSIS — G894 Chronic pain syndrome: Secondary | ICD-10-CM | POA: Diagnosis not present

## 2023-07-19 DIAGNOSIS — T402X5A Adverse effect of other opioids, initial encounter: Secondary | ICD-10-CM | POA: Diagnosis not present

## 2023-07-19 DIAGNOSIS — Z79891 Long term (current) use of opiate analgesic: Secondary | ICD-10-CM | POA: Diagnosis not present

## 2023-07-19 DIAGNOSIS — M79605 Pain in left leg: Secondary | ICD-10-CM | POA: Diagnosis not present

## 2023-07-19 DIAGNOSIS — Z1331 Encounter for screening for depression: Secondary | ICD-10-CM | POA: Diagnosis not present

## 2023-07-19 DIAGNOSIS — M545 Low back pain, unspecified: Secondary | ICD-10-CM | POA: Diagnosis not present

## 2023-07-24 ENCOUNTER — Other Ambulatory Visit: Payer: Self-pay | Admitting: Family

## 2023-07-24 DIAGNOSIS — R600 Localized edema: Secondary | ICD-10-CM

## 2023-07-24 MED ORDER — FUROSEMIDE 40 MG PO TABS: 40.0000 mg | ORAL_TABLET | Freq: Every day | ORAL | 1 refills | Status: DC

## 2023-07-24 NOTE — Telephone Encounter (Signed)
 Efill failed. resent

## 2023-07-24 NOTE — Addendum Note (Signed)
 Addended by: Kekai Geter D on: 07/24/2023 02:37 PM   Modules accepted: Orders

## 2023-08-08 DIAGNOSIS — F411 Generalized anxiety disorder: Secondary | ICD-10-CM | POA: Diagnosis not present

## 2023-08-22 DIAGNOSIS — F411 Generalized anxiety disorder: Secondary | ICD-10-CM | POA: Diagnosis not present

## 2023-08-23 ENCOUNTER — Encounter: Payer: Self-pay | Admitting: Advanced Practice Midwife

## 2023-08-24 ENCOUNTER — Other Ambulatory Visit: Payer: Self-pay | Admitting: Family

## 2023-08-24 DIAGNOSIS — J301 Allergic rhinitis due to pollen: Secondary | ICD-10-CM

## 2023-09-12 DIAGNOSIS — F411 Generalized anxiety disorder: Secondary | ICD-10-CM | POA: Diagnosis not present

## 2023-09-12 DIAGNOSIS — F339 Major depressive disorder, recurrent, unspecified: Secondary | ICD-10-CM | POA: Diagnosis not present

## 2023-09-12 DIAGNOSIS — F320 Major depressive disorder, single episode, mild: Secondary | ICD-10-CM | POA: Diagnosis not present

## 2023-09-20 DIAGNOSIS — G894 Chronic pain syndrome: Secondary | ICD-10-CM | POA: Diagnosis not present

## 2023-09-20 DIAGNOSIS — M79605 Pain in left leg: Secondary | ICD-10-CM | POA: Diagnosis not present

## 2023-09-20 DIAGNOSIS — M545 Low back pain, unspecified: Secondary | ICD-10-CM | POA: Diagnosis not present

## 2023-09-20 DIAGNOSIS — F1190 Opioid use, unspecified, uncomplicated: Secondary | ICD-10-CM | POA: Diagnosis not present

## 2023-09-20 DIAGNOSIS — M5416 Radiculopathy, lumbar region: Secondary | ICD-10-CM | POA: Diagnosis not present

## 2023-09-20 DIAGNOSIS — M961 Postlaminectomy syndrome, not elsewhere classified: Secondary | ICD-10-CM | POA: Diagnosis not present

## 2023-09-23 DIAGNOSIS — F411 Generalized anxiety disorder: Secondary | ICD-10-CM | POA: Diagnosis not present

## 2023-09-26 ENCOUNTER — Ambulatory Visit (INDEPENDENT_AMBULATORY_CARE_PROVIDER_SITE_OTHER): Payer: Medicare HMO

## 2023-09-26 VITALS — BP 124/71 | HR 87 | Ht 73.0 in | Wt 202.0 lb

## 2023-09-26 DIAGNOSIS — Z0000 Encounter for general adult medical examination without abnormal findings: Secondary | ICD-10-CM
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
## 2023-09-26 NOTE — Patient Instructions (Addendum)
 [name] , Thank you for taking time out of your busy schedule to complete your Annual Wellness Visit with me. I enjoyed our conversation and look forward to speaking with you again next year. I, as well as your care team,  appreciate your ongoing commitment to your health goals. Please review the following plan we discussed and let me know if I can assist you in the future. Your Game plan/ To Do List    Referrals: If you haven't heard from the office you've been referred to, please reach out to them at the phone provided.   Follow up Visits: We will see or speak with you next year for your Next Medicare AWV with our clinical staff on 09/28/24 12:30p.m. Have you seen your provider in the last 6 months (3 months if uncontrolled diabetes)? Yes  Clinician Recommendations:  Aim for 30 minutes of exercise or brisk walking, 6-8 glasses of water, and 5 servings of fruits and vegetables each day.       This is a list of the screenings recommended for you:  Health Maintenance  Topic Date Due   Pap with HPV screening  Never done   COVID-19 Vaccine (4 - 2024-25 season) 10/07/2022   Medicare Annual Wellness Visit  09/25/2023   Flu Shot  09/06/2023   Mammogram  02/04/2024   Colon Cancer Screening  05/25/2024   DEXA scan (bone density measurement)  10/03/2024   DTaP/Tdap/Td vaccine (3 - Td or Tdap) 07/11/2032   Pneumococcal Vaccine for age over 110  Completed   Hepatitis C Screening  Completed   HIV Screening  Completed   Zoster (Shingles) Vaccine  Completed   Hepatitis B Vaccine  Aged Out   HPV Vaccine  Aged Out   Meningitis B Vaccine  Aged Out    Advanced directives: (Declined) Advance directive discussed with you today. Even though you declined this today, please call our office should you change your mind, and we can give you the proper paperwork for you to fill out. Advance Care Planning is important because it:  [x]  Makes sure you receive the medical care that is consistent with your  values, goals, and preferences  [x]  It provides guidance to your family and loved ones and reduces their decisional burden about whether or not they are making the right decisions based on your wishes.  Follow the link provided in your after visit summary or read over the paperwork we have mailed to you to help you started getting your Advance Directives in place. If you need assistance in completing these, please reach out to us  so that we can help you!  See attachments for Preventive Care and Fall Prevention Tips.

## 2023-10-03 DIAGNOSIS — F330 Major depressive disorder, recurrent, mild: Secondary | ICD-10-CM | POA: Diagnosis not present

## 2023-10-17 DIAGNOSIS — F411 Generalized anxiety disorder: Secondary | ICD-10-CM | POA: Diagnosis not present

## 2023-10-17 DIAGNOSIS — F320 Major depressive disorder, single episode, mild: Secondary | ICD-10-CM | POA: Diagnosis not present

## 2023-11-07 DIAGNOSIS — F411 Generalized anxiety disorder: Secondary | ICD-10-CM | POA: Diagnosis not present

## 2023-11-14 ENCOUNTER — Ambulatory Visit: Admitting: Family

## 2023-11-14 DIAGNOSIS — M545 Low back pain, unspecified: Secondary | ICD-10-CM | POA: Diagnosis not present

## 2023-11-14 DIAGNOSIS — M5459 Other low back pain: Secondary | ICD-10-CM | POA: Diagnosis not present

## 2023-11-14 DIAGNOSIS — M961 Postlaminectomy syndrome, not elsewhere classified: Secondary | ICD-10-CM | POA: Diagnosis not present

## 2023-11-14 DIAGNOSIS — M79605 Pain in left leg: Secondary | ICD-10-CM | POA: Diagnosis not present

## 2023-11-14 DIAGNOSIS — G8929 Other chronic pain: Secondary | ICD-10-CM | POA: Diagnosis not present

## 2023-11-14 DIAGNOSIS — G894 Chronic pain syndrome: Secondary | ICD-10-CM | POA: Diagnosis not present

## 2023-11-14 DIAGNOSIS — F1190 Opioid use, unspecified, uncomplicated: Secondary | ICD-10-CM | POA: Diagnosis not present

## 2023-11-14 DIAGNOSIS — M25562 Pain in left knee: Secondary | ICD-10-CM | POA: Diagnosis not present

## 2023-11-14 DIAGNOSIS — M25561 Pain in right knee: Secondary | ICD-10-CM | POA: Diagnosis not present

## 2023-11-15 ENCOUNTER — Other Ambulatory Visit: Payer: Self-pay | Admitting: Family

## 2023-11-15 DIAGNOSIS — Z1231 Encounter for screening mammogram for malignant neoplasm of breast: Secondary | ICD-10-CM

## 2023-11-18 DIAGNOSIS — F411 Generalized anxiety disorder: Secondary | ICD-10-CM | POA: Diagnosis not present

## 2023-11-22 ENCOUNTER — Encounter: Payer: Self-pay | Admitting: Family

## 2023-11-22 ENCOUNTER — Ambulatory Visit (INDEPENDENT_AMBULATORY_CARE_PROVIDER_SITE_OTHER): Admitting: Family

## 2023-11-22 VITALS — BP 115/76 | HR 83 | Temp 97.1°F | Ht 73.0 in | Wt 187.8 lb

## 2023-11-22 DIAGNOSIS — E785 Hyperlipidemia, unspecified: Secondary | ICD-10-CM

## 2023-11-22 DIAGNOSIS — M5442 Lumbago with sciatica, left side: Secondary | ICD-10-CM | POA: Diagnosis not present

## 2023-11-22 DIAGNOSIS — R600 Localized edema: Secondary | ICD-10-CM | POA: Diagnosis not present

## 2023-11-22 DIAGNOSIS — M25512 Pain in left shoulder: Secondary | ICD-10-CM

## 2023-11-22 DIAGNOSIS — F411 Generalized anxiety disorder: Secondary | ICD-10-CM

## 2023-11-22 DIAGNOSIS — F322 Major depressive disorder, single episode, severe without psychotic features: Secondary | ICD-10-CM | POA: Diagnosis not present

## 2023-11-22 DIAGNOSIS — M542 Cervicalgia: Secondary | ICD-10-CM

## 2023-11-22 DIAGNOSIS — M150 Primary generalized (osteo)arthritis: Secondary | ICD-10-CM

## 2023-11-22 DIAGNOSIS — G8929 Other chronic pain: Secondary | ICD-10-CM

## 2023-11-22 DIAGNOSIS — K5903 Drug induced constipation: Secondary | ICD-10-CM

## 2023-11-22 DIAGNOSIS — Z23 Encounter for immunization: Secondary | ICD-10-CM

## 2023-11-22 LAB — CMP14+EGFR
ALT: 21 IU/L (ref 0–32)
AST: 18 IU/L (ref 0–40)
Albumin: 4.1 g/dL (ref 3.9–4.9)
Alkaline Phosphatase: 100 IU/L (ref 49–135)
BUN/Creatinine Ratio: 10 — ABNORMAL LOW (ref 12–28)
BUN: 10 mg/dL (ref 8–27)
Bilirubin Total: 0.6 mg/dL (ref 0.0–1.2)
CO2: 21 mmol/L (ref 20–29)
Calcium: 9 mg/dL (ref 8.7–10.3)
Chloride: 108 mmol/L — ABNORMAL HIGH (ref 96–106)
Creatinine, Ser: 0.99 mg/dL (ref 0.57–1.00)
Globulin, Total: 2.2 g/dL (ref 1.5–4.5)
Glucose: 79 mg/dL (ref 70–99)
Potassium: 4.3 mmol/L (ref 3.5–5.2)
Sodium: 141 mmol/L (ref 134–144)
Total Protein: 6.3 g/dL (ref 6.0–8.5)
eGFR: 63 mL/min/1.73 (ref >59)

## 2023-11-22 MED ORDER — DICLOFENAC SODIUM 75 MG PO TBEC: 75.0000 mg | DELAYED_RELEASE_TABLET | Freq: Two times a day (BID) | ORAL | 0 refills | Status: AC

## 2023-11-22 MED ORDER — FUROSEMIDE 40 MG PO TABS: 40.0000 mg | ORAL_TABLET | Freq: Every day | ORAL | 1 refills | Status: AC

## 2023-11-22 MED ORDER — EZETIMIBE 10 MG PO TABS: 10.0000 mg | ORAL_TABLET | Freq: Every day | ORAL | 3 refills | Status: AC

## 2023-11-22 NOTE — Patient Instructions (Signed)
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
## 2023-11-25 ENCOUNTER — Ambulatory Visit: Payer: Self-pay | Admitting: Family

## 2023-12-05 DIAGNOSIS — F320 Major depressive disorder, single episode, mild: Secondary | ICD-10-CM | POA: Diagnosis not present

## 2023-12-05 DIAGNOSIS — F411 Generalized anxiety disorder: Secondary | ICD-10-CM | POA: Diagnosis not present

## 2023-12-26 ENCOUNTER — Ambulatory Visit: Payer: Self-pay

## 2023-12-26 DIAGNOSIS — F320 Major depressive disorder, single episode, mild: Secondary | ICD-10-CM | POA: Diagnosis not present

## 2023-12-26 NOTE — Telephone Encounter (Signed)
 FYI Only or Action Required?: Action required by provider: Requesting cough medications.  Patient was last seen in primary care on 11/22/2023 by Lavell Bari LABOR, FNP.  Called Nurse Triage reporting Cough.  Symptoms began several days ago.  Interventions attempted: Rest, hydration, or home remedies.  Symptoms are: stable.  Triage Disposition: Home Care  Patient/caregiver understands and will follow disposition?: Yes Reason for Disposition  Cough  Answer Assessment - Initial Assessment Questions Patient denies OV, and is requesting something be sent to [facility] on file for cough to help further healing process. Please advise.  1. ONSET: When did the cough begin?      Sunday  2. SEVERITY: How bad is the cough today?      Moderate  3. SPUTUM: Describe the color of your sputum (e.g., none, dry cough; clear, white, yellow, green)     Mucous production has gotten better and now its clear. States it was dark before  4. DIFFICULTY BREATHING: Are you having difficulty breathing? If Yes, ask: How bad is it? (e.g., mild, moderate, severe)      Denies  5. FEVER: Do you have a fever? If Yes, ask: What is your temperature, how was it measured, and when did it start?     Denies  6. CARDIAC HISTORY: Do you have any history of heart disease? (e.g., heart attack, congestive heart failure)      Denies  7. LUNG HISTORY: Do you have any history of lung disease?  (e.g., pulmonary embolus, asthma, emphysema)     Denies  8. OTHER SYMPTOMS: Do you have any other symptoms? (e.g., runny nose, wheezing, chest pain)       Scratchy throat, voice hoarseness  Protocols used: Cough - Acute Productive-A-AH  Copied from CRM #[id number]. Topic: Clinical - Medical Advice >> Dec 26, 2023  2:53 PM Victoria B wrote: Reason for CRM: Patient called in, didn't ant appt, just requesting med be sent in for her cough she has at night, says she's getting better just wants something else to  further the healing along

## 2023-12-26 NOTE — Telephone Encounter (Signed)
 Called and spoke with patient and got her scheduled to be seen tomorrow for this issue.

## 2023-12-27 ENCOUNTER — Ambulatory Visit (INDEPENDENT_AMBULATORY_CARE_PROVIDER_SITE_OTHER): Admitting: Family Medicine

## 2023-12-27 VITALS — BP 125/79 | HR 88 | Temp 97.6°F | Ht 73.0 in | Wt 185.0 lb

## 2023-12-27 DIAGNOSIS — J3489 Other specified disorders of nose and nasal sinuses: Secondary | ICD-10-CM | POA: Diagnosis not present

## 2023-12-27 DIAGNOSIS — R051 Acute cough: Secondary | ICD-10-CM | POA: Diagnosis not present

## 2023-12-27 DIAGNOSIS — B9689 Other specified bacterial agents as the cause of diseases classified elsewhere: Secondary | ICD-10-CM

## 2023-12-27 DIAGNOSIS — J019 Acute sinusitis, unspecified: Secondary | ICD-10-CM | POA: Diagnosis not present

## 2023-12-27 DIAGNOSIS — J302 Other seasonal allergic rhinitis: Secondary | ICD-10-CM

## 2023-12-27 MED ORDER — BENZONATATE 100 MG PO CAPS: 100.0000 mg | ORAL_CAPSULE | Freq: Three times a day (TID) | ORAL | 0 refills | Status: AC | PRN

## 2023-12-27 MED ORDER — AMOXICILLIN-POT CLAVULANATE 875-125 MG PO TABS: 1.0000 | ORAL_TABLET | Freq: Two times a day (BID) | ORAL | 0 refills | Status: AC

## 2023-12-27 MED ORDER — FLUTICASONE PROPIONATE 50 MCG/ACT NA SUSP: 1.0000 | Freq: Every day | NASAL | 6 refills | Status: AC

## 2023-12-27 NOTE — Patient Instructions (Signed)
 Follow up if symptoms acutely worsen, no improvement over the next 2-3 days, fever develops, or for any other concerns

## 2023-12-27 NOTE — Progress Notes (Signed)
 Acute Office Visit  Subjective:     Patient ID: Molly Castaneda, female    DOB: [birth year], 63 y.o.   MRN: [id number]  Chief Complaint  Patient presents with   Cough    Patient reports that she has had a cough for about a week. Denies that cough is getting worse. Denies wheezing. Patient says she has been coughing up phlegm but says its not green/yellow anymore. Says it is clear. Patient has been taking OTC Robitussin and Mucus and Sinus medicine that has helped, but says she can't get rid of the cough completely. Worse at night time.     HPI Patient is in today for URI symptoms.  Patient reports that this past Sat/Sun she began to develop a cough and nasal drainage.   Symptoms have not been improving.   Patient having dark yellow mucous phlegm and drainage.   Having pressure in head and face.   Intermittent headache.   Has been taking OTC mucinex and robitussin.  Reports getting a fever every year around this time.   Denies fever       ROS      Objective:    BP 125/79   Pulse 88   Temp 97.6 F (36.4 C)   Ht 6' 1 (1.854 m)   Wt 185 lb (83.9 kg)   SpO2 99%   BMI 24.41 kg/m    Physical Exam Vitals reviewed.  Constitutional:      Appearance: Normal appearance.  HENT:     Head: Normocephalic and atraumatic.     Right Ear: Tympanic membrane, ear canal and external ear normal.     Left Ear: Tympanic membrane, ear canal and external ear normal.     Ears:     Comments: Moderate amount of cerumen bilat canals    Mouth/Throat:     Mouth: Mucous membranes are moist.     Pharynx: Oropharynx is clear.     Comments: PND observed to the posterior pharynx Eyes:     Extraocular Movements: Extraocular movements intact.     Conjunctiva/sclera: Conjunctivae normal.     Pupils: Pupils are equal, round, and reactive to light.  Cardiovascular:     Rate and Rhythm: Normal rate and regular rhythm.     Pulses: Normal pulses.     Heart sounds: Normal heart sounds. No  murmur heard. Pulmonary:     Effort: Pulmonary effort is normal. No respiratory distress.     Breath sounds: Normal breath sounds.  Musculoskeletal:        General: No deformity. Normal range of motion.     Cervical back: Normal range of motion and neck supple.  Skin:    General: Skin is warm and dry.     Capillary Refill: Capillary refill takes less than 2 seconds.  Neurological:     General: No focal deficit present.     Mental Status: She is alert and oriented to person, place, and time.  Psychiatric:        Mood and Affect: Mood normal.        Behavior: Behavior normal.     No results found for any visits on 12/27/23.      Assessment & Plan:   Problem List Items Addressed This Visit   None Visit Diagnoses       Acute bacterial sinusitis    -  Primary   Relevant Medications   fluticasone  (FLONASE ) 50 MCG/ACT nasal spray   benzonatate  (TESSALON ) 100 MG capsule  amoxicillin -clavulanate (AUGMENTIN ) 875-125 MG tablet     Acute cough       Relevant Medications   benzonatate  (TESSALON ) 100 MG capsule     Nasal drainage         Seasonal allergies       Relevant Medications   fluticasone  (FLONASE ) 50 MCG/ACT nasal spray       Meds ordered this encounter  Medications   fluticasone  (FLONASE ) 50 MCG/ACT nasal spray    Sig: Place 1 spray into both nostrils daily.    Dispense:  16 g    Refill:  6    Supervising Provider:   GOTTSCHALK, ASHLY M [[id number]]   benzonatate  (TESSALON ) 100 MG capsule    Sig: Take 1 capsule (100 mg total) by mouth 3 (three) times daily as needed for cough.    Dispense:  30 capsule    Refill:  0    Supervising Provider:   GOTTSCHALK, ASHLY M [[id number]]   amoxicillin -clavulanate (AUGMENTIN ) 875-125 MG tablet    Sig: Take 1 tablet by mouth 2 (two) times daily for 7 days.    Dispense:  14 tablet    Refill:  0    Supervising Provider:   GOTTSCHALK, ASHLY M [[id number]]    Ordered augmentin  BID x 7 days for acute bacterial sinusitis.  Tessalon   perles prn  Refilled flonase  per patient request.   Follow up if symptoms acutely worsen, no improvement over the next 2-3 days, fever develops, or for any other concerns    Return if symptoms worsen or fail to improve.  Oneil LELON Severin, FNP

## 2024-01-06 ENCOUNTER — Other Ambulatory Visit: Payer: Self-pay | Admitting: Family

## 2024-01-06 ENCOUNTER — Ambulatory Visit: Payer: Self-pay | Admitting: Family

## 2024-01-06 MED ORDER — FLUCONAZOLE 150 MG PO TABS: 150.0000 mg | ORAL_TABLET | ORAL | 0 refills | Status: AC

## 2024-01-06 NOTE — Telephone Encounter (Signed)
Diflucan Prescription sent to pharmacy   

## 2024-01-06 NOTE — Telephone Encounter (Signed)
 Left message meds was called in

## 2024-01-06 NOTE — Telephone Encounter (Signed)
 Called and spoke with patient called dropped

## 2024-01-06 NOTE — Telephone Encounter (Signed)
 Message from Alfonso ORN sent at 01/06/2024 11:08 AM EST  Summary: antibotic caused yeast infection requesting medication for the yeast infection   Reason for Triage: Patient was seen on 11/21 /25 with Molly Castaneda and was prescribed antibotic and patient develop a yeast infection , pt stated everytime she takes an antibotic it causes yeast infection patient is requesting a pill to help with the yeast infection

## 2024-02-05 ENCOUNTER — Ambulatory Visit

## 2024-02-05 ENCOUNTER — Ambulatory Visit
Admission: RE | Admit: 2024-02-05 | Discharge: 2024-02-05 | Disposition: A | Discharge status: Home / Self Care | Source: Ambulatory Visit | Attending: Family | Admitting: Family

## 2024-02-05 DIAGNOSIS — Z1231 Encounter for screening mammogram for malignant neoplasm of breast: Secondary | ICD-10-CM

## 2024-05-22 ENCOUNTER — Ambulatory Visit: Payer: Self-pay | Admitting: Family

## 2024-09-28 ENCOUNTER — Ambulatory Visit: Payer: Self-pay
